# Patient Record
Sex: Male | Born: 1965 | Race: White | Hispanic: No | Marital: Married | Smoking: Current every day smoker
Health system: Southern US, Community
[De-identification: ages and names within clinical notes are randomized; demographics above are authoritative.]

## PROBLEM LIST (undated history)

## (undated) DIAGNOSIS — F1511 Other stimulant abuse, in remission: Secondary | ICD-10-CM

## (undated) DIAGNOSIS — E7800 Pure hypercholesterolemia, unspecified: Secondary | ICD-10-CM

## (undated) DIAGNOSIS — Z87891 Personal history of nicotine dependence: Secondary | ICD-10-CM

## (undated) DIAGNOSIS — I2510 Atherosclerotic heart disease of native coronary artery without angina pectoris: Secondary | ICD-10-CM

## (undated) HISTORY — PX: BACK SURGERY: SHX140

## (undated) HISTORY — PX: CORONARY ANGIOPLASTY WITH STENT PLACEMENT: SHX49

---

## 2010-09-15 ENCOUNTER — Inpatient Hospital Stay (HOSPITAL_COMMUNITY)
Admission: EM | Admit: 2010-09-15 | Discharge: 2010-09-16 | DRG: 143 | Disposition: A | Discharge status: Home / Self Care | Payer: BC Managed Care – PPO | Attending: Internal Medicine | Admitting: Internal Medicine

## 2010-09-15 ENCOUNTER — Emergency Department (HOSPITAL_COMMUNITY): Payer: BC Managed Care – PPO

## 2010-09-15 DIAGNOSIS — I2510 Atherosclerotic heart disease of native coronary artery without angina pectoris: Secondary | ICD-10-CM | POA: Diagnosis present

## 2010-09-15 DIAGNOSIS — F17200 Nicotine dependence, unspecified, uncomplicated: Secondary | ICD-10-CM | POA: Diagnosis present

## 2010-09-15 DIAGNOSIS — R51 Headache: Secondary | ICD-10-CM | POA: Diagnosis present

## 2010-09-15 DIAGNOSIS — R0789 Other chest pain: Principal | ICD-10-CM | POA: Diagnosis present

## 2010-09-15 DIAGNOSIS — F1910 Other psychoactive substance abuse, uncomplicated: Secondary | ICD-10-CM | POA: Diagnosis present

## 2010-09-15 DIAGNOSIS — R079 Chest pain, unspecified: Secondary | ICD-10-CM

## 2010-09-15 DIAGNOSIS — I252 Old myocardial infarction: Secondary | ICD-10-CM

## 2010-09-15 LAB — CBC
HCT: 40 % (ref 39.0–52.0)
MCHC: 35.8 g/dL (ref 30.0–36.0)
RDW: 13.2 % (ref 11.5–15.5)

## 2010-09-15 LAB — BASIC METABOLIC PANEL
BUN: 10 mg/dL (ref 6–23)
Calcium: 9.4 mg/dL (ref 8.4–10.5)
Creatinine, Ser: 0.83 mg/dL (ref 0.50–1.35)
GFR calc Af Amer: >60 mL/min (ref >60)
GFR calc non Af Amer: >60 mL/min (ref >60)
Potassium: 3.9 mEq/L (ref 3.5–5.1)

## 2010-09-15 LAB — CK TOTAL AND CKMB (NOT AT ARMC): Relative Index: INVALID (ref 0.0–2.5)

## 2010-09-16 ENCOUNTER — Inpatient Hospital Stay (HOSPITAL_COMMUNITY): Payer: BC Managed Care – PPO

## 2010-09-16 LAB — BASIC METABOLIC PANEL
CO2: 28 mEq/L (ref 19–32)
Chloride: 105 mEq/L (ref 96–112)
GFR calc Af Amer: >60 mL/min (ref >60)
Potassium: 4 mEq/L (ref 3.5–5.1)
Sodium: 140 mEq/L (ref 135–145)

## 2010-09-16 LAB — CARDIAC PANEL(CRET KIN+CKTOT+MB+TROPI): Relative Index: INVALID (ref 0.0–2.5)

## 2010-09-16 LAB — CBC
HCT: 37.8 % — ABNORMAL LOW (ref 39.0–52.0)
Hemoglobin: 12.9 g/dL — ABNORMAL LOW (ref 13.0–17.0)
MCH: 29.1 pg (ref 26.0–34.0)
MCV: 85.1 fL (ref 78.0–100.0)
RBC: 4.44 MIL/uL (ref 4.22–5.81)

## 2010-09-16 MED ORDER — TECHNETIUM TC 99M TETROFOSMIN IV KIT
30.0000 | PACK | Freq: Once | INTRAVENOUS | Status: AC | PRN
  Administered 2010-09-16: 30 via INTRAVENOUS

## 2010-09-16 MED ORDER — TECHNETIUM TC 99M TETROFOSMIN IV KIT
10.0000 | PACK | Freq: Once | INTRAVENOUS | Status: AC | PRN
  Administered 2010-09-16: 10 via INTRAVENOUS

## 2010-09-17 ENCOUNTER — Other Ambulatory Visit (HOSPITAL_COMMUNITY): Payer: BC Managed Care – PPO

## 2010-09-17 LAB — URINE DRUGS OF ABUSE SCREEN W ALC, ROUTINE (REF LAB)
Amphetamine Screen, Ur: NEGATIVE
Creatinine,U: 107.6 mg/dL
Marijuana Metabolite: NEGATIVE
Methadone: NEGATIVE
Propoxyphene: NEGATIVE

## 2010-09-23 ENCOUNTER — Emergency Department (HOSPITAL_COMMUNITY): Payer: BC Managed Care – PPO

## 2010-09-23 ENCOUNTER — Emergency Department (HOSPITAL_COMMUNITY)
Admission: EM | Admit: 2010-09-23 | Discharge: 2010-09-24 | Disposition: A | Discharge status: Home / Self Care | Payer: BC Managed Care – PPO | Attending: Emergency Medicine | Admitting: Emergency Medicine

## 2010-09-23 DIAGNOSIS — M25529 Pain in unspecified elbow: Secondary | ICD-10-CM | POA: Insufficient documentation

## 2010-09-23 DIAGNOSIS — I2510 Atherosclerotic heart disease of native coronary artery without angina pectoris: Secondary | ICD-10-CM | POA: Insufficient documentation

## 2010-09-23 DIAGNOSIS — I252 Old myocardial infarction: Secondary | ICD-10-CM | POA: Insufficient documentation

## 2010-09-23 NOTE — Discharge Summary (Signed)
[name], [name]             ACCOUNT NO.:  [id number]  MEDICAL RECORD NO.:  [id number]  LOCATION:  2917                         FACILITY:  [facility]  PHYSICIAN:  Jake Bathe, MD      DATE OF BIRTH:  May 22, 1965  DATE OF ADMISSION:  09/15/2010 DATE OF DISCHARGE:  09/16/2010                              DISCHARGE SUMMARY   FINAL DIAGNOSES: 1. Chest pain. 2. Coronary artery disease, status post percutaneous coronary     intervention in 2009 in [place]. 3. Tobacco use. 4. Polysubstance abuse, prior methamphetamine use.  He quit     methamphetamine 3 weeks ago.  He states quit cocaine 10 years ago.  FAMILY HISTORY:  Father with myocardial infarction at age 7.  BRIEF HOSPITAL COURSE:  A 45 year old with known coronary artery disease, status post DES to possibly LAD in 2009 after a myocardial infarction who just drove him from [place] the other day.  He was asleep, woke up, and he had some left-sided chest discomfort, described as a tightness or pressure, lasted about an hour and was similar to his prior MI pain.  This went away.  While he was in the emergency department, he had another more severe episode with some shortness of breath as well as diaphoresis.  He is a heavy smoker and can smoke up to 3 packs per day.  Recently, he quit meth use about 3 weeks ago.  He is currently not taking any medications including aspirin.  He was placed on a nitroglycerin drip overnight and did quite well except for his headache.  In the morning after sleeping, he felt better and improved.  Nitroglycerin was discontinued, and he was ambulating well without any difficulty.  A nuclear stress test was performed, which demonstrated no evidence of ischemia.  Ejection fraction was calculated at 48%.  This was personally viewed.  Mild diaphragmatic attenuation noted.  After discussion with the patient, he is eager to go forward with his rehabilitation here in [place].  He is ambulating well  and eating well.  No further chest discomfort.  I have instructed him to take medications.  DISCHARGE MEDICATIONS: 1. Aspirin 81 mg a day. 2. Metoprolol 25 mg twice a day. 3. Isosorbide mononitrate 30 mg a day. 4. Nitroglycerin 0.4 mg sublingual p.r.n. chest pain. 5. Simvastatin 20 mg daily.  LABORATORY WORK:  Cardiac biomarkers negative x3.  White count of 8.8, hemoglobin 12.9, platelets 256, BUN 14, creatinine 0.7, potassium 4.0, sodium 140.  Chest x-ray showed suggestion of upper lobe emphysema.  No acute abnormality.  Nuclear stress test as above, low-risk, no ischemia. Echocardiogram demonstrates an ejection fraction in the low normal range of approximately 50% with no other significant abnormalities.  FOLLOWUP:  I have made a followup appointment for him to see me on October 10, 2010, at 1:30 p.m.  I have also instructed him that he needs to establish a primary physician.  I do feel comfortable allowing him to proceed with his rehabilitation for polysubstance abuse.  I have given him a note stating this.  Thirty five minutes spent on discharge, reviewing discharge medications, lab work, instructions with the patient.     Jake Bathe, MD  MCS/MEDQ  D:  09/16/2010  T:  09/17/2010  Job:  [id number]  Electronically Signed by Donato Schultz MD on 09/23/2010 05:47:58 AM

## 2010-10-17 NOTE — Consult Note (Signed)
[name], [name]             ACCOUNT NO.:  [id number]  MEDICAL RECORD NO.:  [id number]  LOCATION:  2917                         FACILITY:  [facility]  PHYSICIAN:  Natasha Bence, MD       DATE OF BIRTH:  Feb 12, 1966  DATE OF CONSULTATION:  09/15/2010 DATE OF DISCHARGE:                                CONSULTATION   CHIEF COMPLAINT:  Chest pain.  HISTORY OF PRESENT ILLNESS:  [name] is a 45 year old white male with a history of known coronary artery disease, who sustained myocardial infarction and underwent stenting, it sounds like his LAD in 2009 in [place].  [name], he drove from [place] to [place] where he is staying with his daughter.  He was asleep this afternoon and woke up with left-sided chest discomfort that was described as a heavy pressure and lasted approximately 1 hour and was associated with shortness of breath.  It resolved spontaneously when he arrived in the emergency department.  While sitting in the emergency room, he had a worse episode of chest pressure with worsening shortness of breath and diaphoresis.  This was similar to the pain as was similar MI before.  He has been under a lot of stress, recently he moved up from [place] to enter a drug rehabilitation program.  He was a heavy methamphetamines until approximately 3 weeks ago.  He also has a history of cocaine abuse but quit that 10 years ago.  He is also a heavy smoker, smokes one pack per day but formerly smoked 3 packs per day.  He says he also has some calf cramping when walking.  He denies any PND, orthopnea or palpitations.  Prior to this episode of chest pain, he was doing well over the last several weeks.  On June 29 and 30, he was admitted to [facility], [place], [place], for chest discomfort. It sounds like he had some sort of stress test there.  He is unsure of the results and was titrating with medications, however, he has not been on any cardiovascular  medicines for the last several months due to cost. Otherwise, he denies any fevers, chills or sweats.  He has no upcoming plan of surgeries.  He denies any bleeding.  REVIEW OF SYSTEMS:  Rest of 12-point review of systems was performed and was otherwise negative.  PAST MEDICAL HISTORY: 1. Coronary artery disease, status post PCI in 2009. 2. Smoker of methamphetamine abuse.  SOCIAL HISTORY:  No alcohol use.  He smokes one-pack per day, used to smoke three pack per day, history of heavy methamphetamine use just quit 3 weeks ago, and a history of cocaine abuse, quit 10 years ago.  FAMILY HISTORY:  Strong for coronary artery disease with father, had MI at approximately 76 years old.  CURRENT MEDICATIONS:  None.  ALLERGIES:  He has no known drug allergies.  PHYSICAL EXAMINATION:  VITAL SIGNS:  Blood pressure 123/76, heart rate of 76, respiratory rate of 18, O2 sats 90% on room air, and temperature 98.6. GENERAL:  He is a thin white male in no apparent distress. EYES:  He has anicteric sclerae. NECK:  Normal jugular pressure.  No carotid bruits. LUNGS:  Clear  to auscultation bilaterally. CARDIOVASCULAR:  Regular rate and rhythm.  No murmurs, rubs, or gallops. ABDOMEN:  Soft, nontender, no masses or bruits. EXTREMITIES:  Warm without edema, symmetrical, pulses throughout. NEURO:  Grossly afocal. SKIN:  No rashes or ulcers.  LABORATORY DATA:  Calcium 9.4, sodium 139, potassium 3.9, chloride 100, bicarb of 28, BUN of 10, creatinine 0.3, glucose 100.  White blood cell count 9.1, hematocrit of 40, platelet count 270.  Troponin was less than 0.3.  EKG shows sinus rhythm with a rate of 64 beats per minute.  There are no ST changes.  Chest x-ray within normal limits.  IMPRESSION AND PLAN:  A 45 year old white male with known coronary artery disease status post PCI in 2009, who presents with unstable angina.  He is a heavy smoker with a heavy drug use history.  He is not currently on any  of his cardiac medications.  We will admit him to telemetry and check serial troponins as he had a worsened episode in the emergency department.  We will start him on Lovenox b.i.d.  In addition to aspirin, we will also place him on metoprolol and a statin.  We will check lipids in the morning, I will make him n.p.o. for either catheter or stress test in the morning.  We will try obtain records from [facility], [place], [place], to see which test he had done there.  I encouraged smoking cessation with him.          ______________________________ Natasha Bence, MD     MH/MEDQ  D:  09/15/2010  T:  09/16/2010  Job:  [id number]  Electronically Signed by Natasha Bence MD on 10/17/2010 08:25:36 PM

## 2010-12-19 ENCOUNTER — Emergency Department (HOSPITAL_COMMUNITY): Payer: Self-pay

## 2010-12-19 ENCOUNTER — Inpatient Hospital Stay (HOSPITAL_COMMUNITY)
Admission: EM | Admit: 2010-12-19 | Discharge: 2010-12-24 | DRG: 287 | Disposition: A | Discharge status: Rehabilitation Facility | Payer: Self-pay | Attending: Internal Medicine | Admitting: Internal Medicine

## 2010-12-19 DIAGNOSIS — I498 Other specified cardiac arrhythmias: Secondary | ICD-10-CM | POA: Diagnosis present

## 2010-12-19 DIAGNOSIS — Z87891 Personal history of nicotine dependence: Secondary | ICD-10-CM

## 2010-12-19 DIAGNOSIS — R079 Chest pain, unspecified: Secondary | ICD-10-CM | POA: Diagnosis present

## 2010-12-19 DIAGNOSIS — E785 Hyperlipidemia, unspecified: Secondary | ICD-10-CM | POA: Diagnosis present

## 2010-12-19 DIAGNOSIS — I2510 Atherosclerotic heart disease of native coronary artery without angina pectoris: Principal | ICD-10-CM | POA: Diagnosis present

## 2010-12-19 DIAGNOSIS — F1511 Other stimulant abuse, in remission: Secondary | ICD-10-CM | POA: Diagnosis present

## 2010-12-19 DIAGNOSIS — Z9861 Coronary angioplasty status: Secondary | ICD-10-CM

## 2010-12-19 DIAGNOSIS — I200 Unstable angina: Secondary | ICD-10-CM | POA: Diagnosis present

## 2010-12-19 LAB — BASIC METABOLIC PANEL
Calcium: 10.3 mg/dL (ref 8.4–10.5)
GFR calc non Af Amer: >90 mL/min (ref >90)
Glucose, Bld: 92 mg/dL (ref 70–99)
Potassium: 4 mEq/L (ref 3.5–5.1)
Sodium: 141 mEq/L (ref 135–145)

## 2010-12-19 LAB — CBC
HCT: 41.5 % (ref 39.0–52.0)
MCV: 84.2 fL (ref 78.0–100.0)
Platelets: 262 10*3/uL (ref 150–400)
RBC: 4.93 MIL/uL (ref 4.22–5.81)
WBC: 6.6 10*3/uL (ref 4.0–10.5)

## 2010-12-19 LAB — CK TOTAL AND CKMB (NOT AT ARMC)
CK, MB: 1.8 ng/mL (ref 0.3–4.0)
Relative Index: INVALID (ref 0.0–2.5)
Total CK: 91 U/L (ref 7–232)

## 2010-12-19 LAB — CARDIAC PANEL(CRET KIN+CKTOT+MB+TROPI): CK, MB: 1.7 ng/mL (ref 0.3–4.0)

## 2010-12-19 LAB — POCT I-STAT TROPONIN I

## 2010-12-20 LAB — DIFFERENTIAL
Basophils Relative: 1 % (ref 0–1)
Lymphocytes Relative: 37 % (ref 12–46)
Lymphs Abs: 2.5 10*3/uL (ref 0.7–4.0)
Monocytes Absolute: 0.5 10*3/uL (ref 0.1–1.0)
Monocytes Relative: 8 % (ref 3–12)
Neutro Abs: 3.4 10*3/uL (ref 1.7–7.7)
Neutrophils Relative %: 51 % (ref 43–77)

## 2010-12-20 LAB — COMPREHENSIVE METABOLIC PANEL
ALT: 15 U/L (ref 0–53)
Alkaline Phosphatase: 66 U/L (ref 39–117)
BUN: 13 mg/dL (ref 6–23)
CO2: 28 mEq/L (ref 19–32)
Calcium: 9.4 mg/dL (ref 8.4–10.5)
GFR calc Af Amer: >90 mL/min (ref >90)
GFR calc non Af Amer: >90 mL/min (ref >90)
Glucose, Bld: 89 mg/dL (ref 70–99)
Potassium: 3.9 mEq/L (ref 3.5–5.1)
Total Protein: 6.4 g/dL (ref 6.0–8.3)

## 2010-12-20 LAB — LIPID PANEL
Cholesterol: 127 mg/dL (ref 0–200)
LDL Cholesterol: 64 mg/dL (ref 0–99)
Total CHOL/HDL Ratio: 3.4 RATIO
VLDL: 26 mg/dL (ref 0–40)

## 2010-12-20 LAB — CARDIAC PANEL(CRET KIN+CKTOT+MB+TROPI)
CK, MB: 1.5 ng/mL (ref 0.3–4.0)
Total CK: 78 U/L (ref 7–232)

## 2010-12-20 LAB — CBC
HCT: 40 % (ref 39.0–52.0)
Hemoglobin: 13.6 g/dL (ref 13.0–17.0)
MCH: 28.7 pg (ref 26.0–34.0)
MCHC: 34 g/dL (ref 30.0–36.0)
MCV: 84.4 fL (ref 78.0–100.0)
RBC: 4.74 MIL/uL (ref 4.22–5.81)

## 2010-12-20 LAB — PROTIME-INR
INR: 1.07 (ref 0.00–1.49)
Prothrombin Time: 14.1 seconds (ref 11.6–15.2)

## 2010-12-20 LAB — MAGNESIUM: Magnesium: 1.9 mg/dL (ref 1.5–2.5)

## 2010-12-20 LAB — TSH: TSH: 2.06 u[IU]/mL (ref 0.350–4.500)

## 2010-12-21 DIAGNOSIS — R079 Chest pain, unspecified: Secondary | ICD-10-CM

## 2010-12-21 LAB — CBC
Hemoglobin: 14.3 g/dL (ref 13.0–17.0)
MCH: 29.3 pg (ref 26.0–34.0)
MCHC: 35 g/dL (ref 30.0–36.0)
Platelets: 260 10*3/uL (ref 150–400)
RDW: 12.6 % (ref 11.5–15.5)

## 2010-12-21 LAB — HEPARIN LEVEL (UNFRACTIONATED)
Heparin Unfractionated: 0.19 IU/mL — ABNORMAL LOW (ref 0.30–0.70)
Heparin Unfractionated: 0.11 IU/mL — ABNORMAL LOW (ref 0.30–0.70)
Heparin Unfractionated: 0.4 IU/mL (ref 0.30–0.70)

## 2010-12-21 NOTE — H&P (Signed)
[name]             ACCOUNT NO.:  [id number]  MEDICAL RECORD NO.:  [id number]  LOCATION:  WLED                         FACILITY:  [facility]  PHYSICIAN:  Kathlen Mody, MD       DATE OF BIRTH:  07-25-1965  DATE OF ADMISSION:  12/19/2010 DATE OF DISCHARGE:                             HISTORY & PHYSICAL   PRIMARY CARE PHYSICIAN:  None.  CHIEF COMPLAINT:  Chest pain.  HISTORY OF PRESENT ILLNESS:  A 45 year old gentleman with history of coronary artery disease status post PCI in 2009 in [place], was brought to [facility] ER for persistent left-sided chest pain, which started yesterday on exertion.  The patient went on a hike and he started having left-sided chest pain like a ton of bricks on his chest.  He went home, took the nitroglycerin, had relief from the chest pain and went to bed. This morning, he got up and as he was walking around, the chest pain came back, left-sided, nonradiating, also felt like a ton of bricks on his chest, associated with some shortness of breath.  He denies any fevers, chills, any cough.  Denies any dizziness or any syncopal episodes.  The chest pain that he had last night and this morning was similar to the pain that had happened before he had the cardiac stenting.  In the ER, patient got sublingual nitroglycerin and he is chest pain-free at this time.  The patient also has a history of heavy methamphetamine drug use until July 2012, where he transferred to the rehab in [place] for drug abuse and has been in the rehab over the last 3 months.  The patient denies any orthopnea, PND, or palpitations. Denies any urinary complaints.  Denies any nausea, vomiting, diarrhea, or abdominal pain.  He denies any headache, blurry vision, any tingling or numbness anywhere in his body.  REVIEW OF SYSTEMS:  See HPI, otherwise negative.  PAST MEDICAL HISTORY: 1. Coronary artery disease status post PCI in 2009. 2. He is an ex-methamphetamine  abuser.  SOCIAL HISTORY:  The [facility] is [facility].  Denies alcohol.  Quit smoking 3 months ago.  Quit meth abuse and he quit cocaine abuse 10 years ago.  FAMILY HISTORY:  Coronary artery disease in father at the age of 47. Patient's father had a massive attack and died from it.  MEDICATIONS:  The patient is on, 1. Aspirin 81. 2. Simvastatin 20 mg. 3. Metoprolol 25 b.i.d. 4. Isosorbide mononitrate 30 mg daily.  ALLERGIES:  No known drug allergies.  PHYSICAL EXAMINATION:  VITAL SIGNS:  He is afebrile.  Blood pressure 110/76, pulse rate of 58, respiratory rate 16, saturating 100% on 2 L. GENERAL:  On exam, he is alert, afebrile, oriented x3, comfortable, in no acute distress. HEENT:  Pupils reacting to light and accommodation.  No JVD.  No scleral icterus. CARDIOVASCULAR:  S1, S2 heard. RESPIRATORY EXAM:  Chest clear to auscultation bilaterally. ABDOMEN:  Soft, nontender, nondistended.  Bowel sounds are heard. EXTREMITIES:  No pedal edema. NEUROLOGICAL:  Nonfocal.  LABORATORY DATA:  CBC within normal limits.  Point-of-care troponin negative.  Basic metabolic panel within normal limits.  CK-MB 1.8 and creatine kinase  91.  The patient had a chest x-ray done, which showed no active disease.  ASSESSMENT AND PLAN:  A 45 year old gentleman with history of meth abuse, currently in rehab, coronary artery disease status post PCI in 2009, came in for unstable angina.  He will be admitted to [facility].  We will get serial troponins.  We will get a 2-D echocardiogram to rule out ACS.  Cardiology consult called.  We will continue the patient on aspirin 325 mg, metoprolol 25 mg daily, simvastatin 20 mg daily, and sublingual nitroglycerin for chest pain p.r.n.  Deep vein thrombosis prophylaxis, subcutaneous Lovenox.  Dosing as per the pharmacy.  The patient is full code.          ______________________________ Kathlen Mody, MD     VA/MEDQ  D:  12/19/2010   T:  12/19/2010  Job:  [id number]  Electronically Signed by Kathlen Mody MD on 12/21/2010 08:13:20 PM

## 2010-12-22 LAB — HEPARIN LEVEL (UNFRACTIONATED)
Heparin Unfractionated: 0.28 IU/mL — ABNORMAL LOW (ref 0.30–0.70)
Heparin Unfractionated: 0.5 IU/mL (ref 0.30–0.70)

## 2010-12-22 LAB — CBC
MCH: 29.1 pg (ref 26.0–34.0)
MCHC: 34.3 g/dL (ref 30.0–36.0)
MCV: 84.9 fL (ref 78.0–100.0)
Platelets: 268 10*3/uL (ref 150–400)
RBC: 5.02 MIL/uL (ref 4.22–5.81)
RDW: 12.8 % (ref 11.5–15.5)

## 2010-12-23 ENCOUNTER — Ambulatory Visit (HOSPITAL_COMMUNITY)
Admission: AD | Admit: 2010-12-23 | Discharge: 2010-12-23 | Disposition: A | Discharge status: Home / Self Care | Payer: Self-pay | Source: Ambulatory Visit | Attending: Interventional Cardiology | Admitting: Interventional Cardiology

## 2010-12-23 DIAGNOSIS — R0602 Shortness of breath: Secondary | ICD-10-CM | POA: Insufficient documentation

## 2010-12-23 DIAGNOSIS — I2510 Atherosclerotic heart disease of native coronary artery without angina pectoris: Secondary | ICD-10-CM | POA: Insufficient documentation

## 2010-12-23 DIAGNOSIS — Z9861 Coronary angioplasty status: Secondary | ICD-10-CM | POA: Insufficient documentation

## 2010-12-23 DIAGNOSIS — I200 Unstable angina: Secondary | ICD-10-CM | POA: Insufficient documentation

## 2010-12-23 LAB — BASIC METABOLIC PANEL
CO2: 27 mEq/L (ref 19–32)
Calcium: 9.7 mg/dL (ref 8.4–10.5)
Creatinine, Ser: 0.79 mg/dL (ref 0.50–1.35)
GFR calc non Af Amer: >90 mL/min (ref >90)
Sodium: 140 mEq/L (ref 135–145)

## 2010-12-23 LAB — CBC
Hemoglobin: 14.1 g/dL (ref 13.0–17.0)
MCH: 28.5 pg (ref 26.0–34.0)
MCHC: 33.3 g/dL (ref 30.0–36.0)
RDW: 12.8 % (ref 11.5–15.5)

## 2010-12-23 LAB — POCT ACTIVATED CLOTTING TIME: Activated Clotting Time: 127 seconds

## 2010-12-23 LAB — HEPARIN LEVEL (UNFRACTIONATED): Heparin Unfractionated: 0.38 IU/mL (ref 0.30–0.70)

## 2010-12-24 LAB — CBC
HCT: 40.1 % (ref 39.0–52.0)
MCHC: 33.9 g/dL (ref 30.0–36.0)
MCV: 84.6 fL (ref 78.0–100.0)
Platelets: 276 10*3/uL (ref 150–400)
RDW: 12.6 % (ref 11.5–15.5)

## 2010-12-24 LAB — BASIC METABOLIC PANEL
BUN: 20 mg/dL (ref 6–23)
Creatinine, Ser: 0.79 mg/dL (ref 0.50–1.35)
GFR calc Af Amer: >90 mL/min (ref >90)
GFR calc non Af Amer: >90 mL/min (ref >90)
Potassium: 3.7 mEq/L (ref 3.5–5.1)

## 2010-12-25 NOTE — Discharge Summary (Addendum)
[name], [name]             ACCOUNT NO.:  [id number]  MEDICAL RECORD NO.:  [id number]  LOCATION:  1406                         FACILITY:  [facility]  PHYSICIAN:  Clydia Llano, MD       DATE OF BIRTH:  [birth year]  DATE OF ADMISSION:  12/19/2010 DATE OF DISCHARGE:  12/24/2010                        DISCHARGE SUMMARY - REFERRING   PRIMARY CARE PROVIDER:  None.  CARDIOLOGIST:  Jake Bathe, MD  DISCHARGE DIAGNOSES: 1. Unstable angina. 2. Coronary artery disease. 3. Ex-methamphetamine abuser.  DISCHARGE MEDICATIONS: 1. Aspirin 81 mg p.o. daily. 2. Imdur 30 mg p.o. daily. 3. Metoprolol tartrate 25 mg p.o. b.i.d. 4. Nitroglycerin sublingually 0.4 mg every 5 minutes as needed up to 3     doses for chest pain. 5. Simvastatin 20 mg p.o. daily.  DIAGNOSTIC LABS:  WBC 6.6, hemoglobin 14.2, hematocrit 41.5, platelets 262.  Sodium 141, potassium 4.0, chloride 104, CO2 28.  BUN 11, creatinine 0.79, glucose 92, total CK 91, CK-MB 1.8.  Troponin 1 is less than 0.30.  Second set:  Total CK 80, CK-MB 1.7, troponin 1 less than 0.30.  Third set:  Total CK 78, CK-MB 1.5, troponin 1 less than 0.30. PT 14.1, INR 1.07.  Magnesium 1.9, phosphorus 4.9.  ProBNP 59.7.  Lipid profile is unremarkable except an HDL of 37.  TSH is 2.06.  DIAGNOSTIC IMAGING: 1. Chest x-ray on October 18 yields heart size and mediastinal     contours within normal limits.  Both lungs are clear.  No active     disease. 2. The patient did have a 2D echo in July of this year that showed an     ejection fraction range of 50-55%.  Left ventricular diastolic     function parameters normal.  CONSULTATIONS:  Dr. Armanda Magic from Cardiology seen on October 19.  PROCEDURES:  Cardiac cath done at [facility] on October 22 yielding a patent LAD stent.  No significant CAD.  Ejection fraction 50%.  BRIEF HISTORY:  [name] is a 45 year old male with a history of coronary artery disease status post PCI in 2009 in [place],  who presented to [facility] ER for persistent left-sided chest pain which had started 24 hours prior to presentation.  The patient reported he went on a hike and started having left-sided chest pain like a ton of bricks on his chest.  He took nitro and got relief for the rest of the day.  The next morning, he got up and experienced the recurrence of the chest pain on the left side nonradiating and associated with shortness of breath.  He denied any fever, chills, or cough.  He indicated that the chest pain that he had experienced was similar to the pain that he had before he had the cardiac stenting.  In the emergency room, the patient got sublingual nitroglycerin and his chest pain resolved.  [facility] hospitalists were asked to admit for further evaluation and treatment.  HOSPITAL COURSE BY PROBLEM: 1. Unstable angina.  The patient was admitted to telemetry unit.     Cardiac enzymes were cycled as dictated above.  He was started on a     heparin drip, aspirin and beta-blocker as  well as nitro paste.     Heparin drip was discontinued on October 21.  The patient was seen     by Dr. Armanda Magic on October 19.  The patient's chest pain     improved during this time frame.  Dr. Norris Cross assessment included     unstable angina with coronary artery disease status post     percutaneous coronary intervention in 2009 in [place] and a repeat     cath a year later in [place].  She recommended a cardiac cath which     was done on October 19 as dictated above.  The patient had 1 brief     incident of very mild chest pain post catheterization the next     morning.  He did not require any nitro and did not report this till     2 hours after the __________.  Other than that 1 episode, he     remained pain free.  He felt better, was ambulating without     difficulty.  At the time of this dictation, he is ready to return     to rehab. 2. Coronary artery disease.  See number #1.  The patient will be      discharged on aspirin, Imdur and a beta-blocker and a statin.  He     did have a cardiac cath as dictated above, which yielded no     significant CAD and an ejection fraction of 50%.  He has a followup     appointment with Dr. Anne Fu on November 14 at 01:30. 3. Ex-methamphetamine user.  The patient is returning to [facility] where he has been for the last several months.  PHYSICAL EXAMINATION:  VITAL SIGNS:  Temperature 97.9, blood pressure 116/66, heart rate 57, respirations 16, sats 97% on room air. GENERAL:  Awake, alert, ambulating in room. CV:  Regular rate and rhythm.  No murmur, gallop, or rub.  No lower extremity edema. RESPIRATORY:  Normal effort.  Breath sounds clear to auscultation bilaterally.  No wheezes, no rhonchi. ABDOMEN:  Flat, soft.  Positive bowel sounds throughout, nontender to palpation. NEUROLOGIC:  Alert and oriented x3.  Speech clear.  Facial symmetry.  DIET:  Heart-healthy.  ACTIVITY:  As tolerated.  FOLLOWUP:  The patient has an appointment with Dr. Anne Fu on November 14 at 01:30.  DISPOSITION:  The patient is medically stable and ready for discharge to [facility].  Time spent on this discharge 35 minutes.     Gwenyth Bender, NP   ______________________________ Clydia Llano, MD    KMB/MEDQ  D:  12/24/2010  T:  12/24/2010  Job:  [id number]  cc:   Jake Bathe, MD Fax: [phone]  Electronically Signed by Toya Smothers  on 12/25/2010 03:52:13 PM Electronically Signed by Clydia Llano  on 12/31/2010 11:07:10 AM

## 2010-12-26 NOTE — Consult Note (Signed)
[name], [name]             ACCOUNT NO.:  [id number]  MEDICAL RECORD NO.:  [id number]  LOCATION:  1406                         FACILITY:  [facility]  PHYSICIAN:  Armanda Magic, M.D.     DATE OF BIRTH:  1965-04-07  DATE OF CONSULTATION:  12/20/2010 DATE OF DISCHARGE:                                CONSULTATION   REFERRING PHYSICIAN:  Kathlen Mody, MD  CHIEF COMPLAINT:  Chest pain.  HISTORY OF PRESENT ILLNESS:  This is a 45 year old male with history of coronary artery disease status post PCI in 2009 in [place] who presented with left-sided chest pain to [facility] on October 18.  The pain started on Wednesday, October 17 and he described it as a persistent left-sided chest pressure.  Apparently, he had gone on a hike and started having left-sided chest pressure like a ton of bricks on his chest.  He went home, took nitroglycerin with relief of chest pain and went to bed.  On the morning of admission, he got up and was walking around.  The pain came back which was left-sided, nonradiating, and felt like a ton of bricks sitting on his chest associated with shortness of breath.  He denied any fevers, chills, or cough.  The pain lasted episodically throughout the night and on the day of admission as well. The pain was similar to what he had before his stent.  In the ER, he received sublingual nitroglycerin and was pain-free.  He does have a history of heavy methamphetamine use until July 2012 when he was transferred to rehab in [place] for drug abuse and had been in rehab for 3 months.  Currently, he complains of very mild discomfort in his chest.  PAST MEDICAL HISTORY: 1. CAD status post PCI in 2009.  Apparently, he was told that he had     some restenoses with stent by cath a year later. 2. Ex-methamphetamine abuser.  SOCIAL HISTORY:  He is currently living in [facility].  He denies any alcohol use.  He quit smoking 3 months ago.  He quit meth abuse and quit  cocaine abuse 10 years ago.  FAMILY HISTORY:  Significant for coronary artery disease in his father at age 45.  His father had a massive MI and died from it.  MEDICATIONS ON ADMISSION: 1. Aspirin 81 mg daily. 2. Simvastatin 20 mg daily. 3. Metoprolol 25 mg b.i.d. 4. Imdur 30 mg daily.  He has no known drug allergies.  REVIEW OF SYSTEMS:  All 10-point review of systems is negative except what stated in the HPI.  PHYSICAL EXAMINATION:  GENERAL:  He is a well-developed, well-nourished, white male, in no acute distress. VITAL SIGNS:  His blood pressure is 111/70, respirations 16, he is afebrile, heart rate 58, and O2 saturation 99% on room air. HEENT:  Benign. NECK:  Supple without lymphadenopathy.  Carotid upstrokes 2+ bilaterally.  No bruits. LUNGS:  Clear to auscultation throughout. HEART:  Regular rate and rhythm.  No murmurs, rubs, or gallops.  Normal S1 and S2. ABDOMEN:  Soft, nontender, and nondistended.  Normoactive bowel sounds. No hepatosplenomegaly. EXTREMITIES:  No edema.  LABORATORY DATA:  BNP is 59.  White cell count 6.6,  hemoglobin 14.2, hematocrit 41.5, and platelet count 262.  Sodium 141, potassium 4, chloride 104, bicarb 28, glucose 92, BUN 11, and creatinine 0.79. Troponin less than 0.3, CPK 91, and MB 81.  Repeat CPK 80, MB 1.7, and troponin less than 0.3.  Last set of cardiac enzymes; CPK 78, MB 1.5, and troponin less than 0.3.  His chest x-ray showed no active disease. EKG is not available on the chart.  ASSESSMENT: 1. Unstable angina pectoris. 2. Coronary artery disease status post percutaneous coronary     intervention in 2009 in [place] and repeat cath one year later in     [place], he was told he had some in-stent restenosis but was     continued on medical management. 3. Ex-amphetamine use.  PLAN:  Cath on Monday by Dr. Anne Fu.  We will continue aspirin, beta- blocker, statin, and nitro paste.  We will start IV heparin drip.     Armanda Magic,  M.D.     TT/MEDQ  D:  12/20/2010  T:  12/21/2010  Job:  [id number]  cc:   Jake Bathe, MD  Electronically Signed by Armanda Magic M.D. on 12/26/2010 02:13:27 PM

## 2010-12-30 NOTE — Cardiovascular Report (Signed)
  [name], [name]             ACCOUNT NO.:  [id number]  MEDICAL RECORD NO.:  [id number]  LOCATION:  CATH                         FACILITY:  [facility]  PHYSICIAN:  Corky Crafts, MDDATE OF BIRTH:  Jan 21, 1966  DATE OF PROCEDURE:  12/23/2010 DATE OF DISCHARGE:                           CARDIAC CATHETERIZATION   PRIMARY CARDIOLOGIST:  Jake Bathe, MD  PROCEDURES PERFORMED:  Left heart catheterization, left ventriculogram, coronary angiogram, abdominal aortogram.  OPERATORS:  Corky Crafts, MD  INDICATIONS:  Unstable angina.  PROCEDURE NARRATIVE:  The risks and benefits of cardiac catheterization were explained to the patient.  Informed consent was obtained.  He was brought to the cath lab.  He was prepped and draped in usual sterile fashion.  His right groin was infiltrated with 1% lidocaine.  A 5-French sheath was placed into the right common femoral artery using modified Seldinger technique.  Left coronary artery angiography was performed using JL-4 pigtail catheter.  The catheter was advanced to the vessel ostium under fluoroscopic guidance.  Digital angiography was performed in multiple projections using hand injection of contrast.  Right coronary artery angiography was performed using JL-4.0 catheter in a similar fashion.  Pigtail catheter was advanced into the ascending aorta and across the aortic valve under fluoroscopic guidance.  Power injection contrast was performed in the RAO projection to image the left ventricle.  Catheter was pulled back under continuous hemodynamic pressure monitoring.  The catheter was withdrawn to the abdominal aorta and a power injection contrast was performed in the AP projection to image the infrarenal aorta.  Manual compression was used to obtain hemostasis of the right groin.  FINDINGS:  The left main was short and widely patent.  Left circumflex is a large dominant vessel.  There is a small OM1 and OM2 both of which were  patent.  The OM3 was medium-sized and widely patent.  The left PDA was medium-sized and widely patent.  Left anterior descending is a large vessel proximally and medium sized in the mid to distal vessel.  There is a patent stent in the mid LAD after the origin of 1st diagonal just proximal to the stent, there is a 25% stenosis.  The 1st diagonal is medium-sized and widely patent.  The right coronary artery is small nondominant vessel which is widely patent.  Left ventriculogram shows overall normal left ventricular function with an estimated ejection fraction of 50%.  Abdominal aortogram shows no abdominal aortic aneurysm.  The bilateral single renal arteries which were widely patent.  The SMA also appears widely patent.  HEMODYNAMIC DATA:  Left ventricular pressure 98/3, aortic pressure 101/61 with a mean aortic pressure of 79 mmHg.  Left ventricular end- diastolic pressure 7 mmHg.  IMPRESSION: 1. Patent left anterior descending stent. 2. No hemodynamically significant coronary artery disease. 3. Ejection fraction of 50%. 4. No abdominal aortic aneurysm or renal artery stenosis.  RECOMMENDATIONS:  This patient needs aggressive secondary prevention including smoking cessation and continued medical therapy.     Corky Crafts, MD     JSV/MEDQ  D:  12/23/2010  T:  12/23/2010  Job:  [id number]  Electronically Signed by Lance Muss MD on 12/30/2010 01:26:14 PM

## 2011-03-01 ENCOUNTER — Emergency Department (HOSPITAL_COMMUNITY)
Admission: EM | Admit: 2011-03-01 | Discharge: 2011-03-01 | Disposition: A | Discharge status: Home / Self Care | Payer: Self-pay | Attending: Emergency Medicine | Admitting: Emergency Medicine

## 2011-03-01 ENCOUNTER — Emergency Department (HOSPITAL_COMMUNITY): Payer: Self-pay

## 2011-03-01 ENCOUNTER — Encounter: Payer: Self-pay | Admitting: *Deleted

## 2011-03-01 DIAGNOSIS — J111 Influenza due to unidentified influenza virus with other respiratory manifestations: Secondary | ICD-10-CM

## 2011-03-01 DIAGNOSIS — E7800 Pure hypercholesterolemia, unspecified: Secondary | ICD-10-CM | POA: Insufficient documentation

## 2011-03-01 DIAGNOSIS — Z79899 Other long term (current) drug therapy: Secondary | ICD-10-CM | POA: Insufficient documentation

## 2011-03-01 DIAGNOSIS — R0602 Shortness of breath: Secondary | ICD-10-CM | POA: Insufficient documentation

## 2011-03-01 DIAGNOSIS — R197 Diarrhea, unspecified: Secondary | ICD-10-CM | POA: Insufficient documentation

## 2011-03-01 DIAGNOSIS — R110 Nausea: Secondary | ICD-10-CM | POA: Insufficient documentation

## 2011-03-01 DIAGNOSIS — R05 Cough: Secondary | ICD-10-CM | POA: Insufficient documentation

## 2011-03-01 DIAGNOSIS — R059 Cough, unspecified: Secondary | ICD-10-CM | POA: Insufficient documentation

## 2011-03-01 DIAGNOSIS — R6889 Other general symptoms and signs: Secondary | ICD-10-CM | POA: Insufficient documentation

## 2011-03-01 DIAGNOSIS — Z7982 Long term (current) use of aspirin: Secondary | ICD-10-CM | POA: Insufficient documentation

## 2011-03-01 DIAGNOSIS — R509 Fever, unspecified: Secondary | ICD-10-CM | POA: Insufficient documentation

## 2011-03-01 HISTORY — DX: Atherosclerotic heart disease of native coronary artery without angina pectoris: I25.10

## 2011-03-01 HISTORY — DX: Pure hypercholesterolemia, unspecified: E78.00

## 2011-03-01 NOTE — ED Notes (Signed)
Starting 3 days ago, the pt began having cold chills, fever, cough, runny nose, N/V/D, and generalized aches and pains.

## 2011-03-01 NOTE — ED Notes (Signed)
Pt c/o cough and shortness of breath x 3 days, nausea x 2 day w/ vomiting x 1 episode. Pt c/o fever and chills. Pt has cardiac hx, denies CP at this time. Pt last took advil and otc sinus med at 1700 tonight w/o relief.

## 2011-03-01 NOTE — ED Provider Notes (Signed)
History     CSN: [id number]  Arrival date & time 03/01/11  1943   First MD Initiated Contact with Patient 03/01/11 2139      Chief Complaint  Patient presents with  . Cough  . Shortness of Breath  . Fever    chills  . Nausea    vomiting x 1    (Consider location/radiation/quality/duration/timing/severity/associated sxs/prior treatment) HPI Patient with complaints of uri symptoms for three days with subjective fever, nausea, and diarrhea.  Patient taking po fluids well now.  No flu shot.  Patient quit smoking in July.  PMD is [facility].   Past Medical History  Diagnosis Date  . Coronary arteriosclerosis 2009    stents placed  . Hypercholesterolemia     Past Surgical History  Procedure Date  . Back surgery     fusion-lumbar    History reviewed. No pertinent family history.  History  Substance Use Topics  . Smoking status: Not on file  . Smokeless tobacco: Not on file  . Alcohol Use:       Review of Systems  All other systems reviewed and are negative.    Allergies  Review of patient's allergies indicates no known allergies.  Home Medications   Current Outpatient Rx  Name Route Sig Dispense Refill  . ASPIRIN EC 81 MG PO TBEC Oral Take 81 mg by mouth daily.      . ISOSORBIDE MONONITRATE ER 30 MG PO TB24 Oral Take 30 mg by mouth daily.      Marland Kitchen METOPROLOL TARTRATE 25 MG PO TABS Oral Take 25 mg by mouth 2 (two) times daily.      Marland Kitchen NITROGLYCERIN 0.4 MG SL SUBL Sublingual Place 0.4 mg under the tongue every 5 (five) minutes as needed.      Marland Kitchen SIMVASTATIN 20 MG PO TABS Oral Take 20 mg by mouth at bedtime.        BP 121/72  Pulse 65  Temp(Src) 97.5 F (36.4 C) (Oral)  Resp 16  SpO2 98%  Physical Exam  Nursing note and vitals reviewed. Constitutional: He is oriented to person, place, and time. He appears well-developed and well-nourished.  HENT:  Head: Normocephalic and atraumatic.  Eyes: Conjunctivae and EOM are normal. Pupils are equal, round, and  reactive to light.  Neck: Normal range of motion. Neck supple.  Cardiovascular: Normal rate and regular rhythm.   Pulmonary/Chest: Effort normal and breath sounds normal.  Abdominal: Soft. Bowel sounds are normal.  Musculoskeletal: Normal range of motion.  Neurological: He is alert and oriented to person, place, and time.  Skin: Skin is warm.  Psychiatric: He has a normal mood and affect.    ED Course  Procedures (including critical care time)  Labs Reviewed - No data to display Dg Chest 2 View  03/01/2011  *RADIOLOGY REPORT*  Clinical Data: Fever and shortness of breath  CHEST - 2 VIEW  Comparison: 12/19/2010  Findings: Normal heart size and pulmonary vascularity.  Mild hyperinflation and scattered fibrosis in the lungs.  No focal airspace consolidation.  No blunting of costophrenic angles.  No pneumothorax.  No significant change since previous study.  IMPRESSION: No evidence of active pulmonary disease.  Original Report Authenticated By: Marlon Pel, M.D.     No diagnosis found.    MDM         Hilario Quarry, MD 03/01/11 [id number]

## 2011-05-08 ENCOUNTER — Emergency Department (HOSPITAL_COMMUNITY): Payer: Self-pay

## 2011-05-08 ENCOUNTER — Other Ambulatory Visit: Payer: Self-pay

## 2011-05-08 ENCOUNTER — Observation Stay (HOSPITAL_COMMUNITY)
Admission: EM | Admit: 2011-05-08 | Discharge: 2011-05-09 | Disposition: A | Discharge status: Home / Self Care | Payer: 59 | Attending: Family Medicine | Admitting: Family Medicine

## 2011-05-08 ENCOUNTER — Encounter (HOSPITAL_COMMUNITY): Payer: Self-pay | Admitting: Emergency Medicine

## 2011-05-08 DIAGNOSIS — R0789 Other chest pain: Principal | ICD-10-CM | POA: Insufficient documentation

## 2011-05-08 DIAGNOSIS — E785 Hyperlipidemia, unspecified: Secondary | ICD-10-CM | POA: Insufficient documentation

## 2011-05-08 DIAGNOSIS — Z9861 Coronary angioplasty status: Secondary | ICD-10-CM | POA: Insufficient documentation

## 2011-05-08 DIAGNOSIS — Z7982 Long term (current) use of aspirin: Secondary | ICD-10-CM | POA: Insufficient documentation

## 2011-05-08 DIAGNOSIS — R079 Chest pain, unspecified: Secondary | ICD-10-CM | POA: Diagnosis present

## 2011-05-08 DIAGNOSIS — I2510 Atherosclerotic heart disease of native coronary artery without angina pectoris: Secondary | ICD-10-CM | POA: Insufficient documentation

## 2011-05-08 DIAGNOSIS — Z23 Encounter for immunization: Secondary | ICD-10-CM | POA: Insufficient documentation

## 2011-05-08 DIAGNOSIS — Z87891 Personal history of nicotine dependence: Secondary | ICD-10-CM | POA: Insufficient documentation

## 2011-05-08 DIAGNOSIS — F1511 Other stimulant abuse, in remission: Secondary | ICD-10-CM | POA: Insufficient documentation

## 2011-05-08 DIAGNOSIS — I249 Acute ischemic heart disease, unspecified: Secondary | ICD-10-CM

## 2011-05-08 DIAGNOSIS — Z79899 Other long term (current) drug therapy: Secondary | ICD-10-CM | POA: Insufficient documentation

## 2011-05-08 DIAGNOSIS — E7800 Pure hypercholesterolemia, unspecified: Secondary | ICD-10-CM | POA: Insufficient documentation

## 2011-05-08 HISTORY — DX: Other stimulant abuse, in remission: F15.11

## 2011-05-08 HISTORY — DX: Personal history of nicotine dependence: Z87.891

## 2011-05-08 LAB — BASIC METABOLIC PANEL
Calcium: 10 mg/dL (ref 8.4–10.5)
GFR calc non Af Amer: >90 mL/min (ref >90)
Glucose, Bld: 111 mg/dL — ABNORMAL HIGH (ref 70–99)
Sodium: 142 mEq/L (ref 135–145)

## 2011-05-08 LAB — CBC
MCH: 29.2 pg (ref 26.0–34.0)
MCHC: 35.2 g/dL (ref 30.0–36.0)
MCV: 82.8 fL (ref 78.0–100.0)
Platelets: 242 10*3/uL (ref 150–400)
RDW: 12.7 % (ref 11.5–15.5)

## 2011-05-08 LAB — URINALYSIS, ROUTINE W REFLEX MICROSCOPIC
Bilirubin Urine: NEGATIVE
Leukocytes, UA: NEGATIVE
Nitrite: NEGATIVE
Specific Gravity, Urine: 1.006 (ref 1.005–1.030)
pH: 7 (ref 5.0–8.0)

## 2011-05-08 LAB — CARDIAC PANEL(CRET KIN+CKTOT+MB+TROPI): Relative Index: INVALID (ref 0.0–2.5)

## 2011-05-08 LAB — DIFFERENTIAL
Basophils Relative: 1 % (ref 0–1)
Eosinophils Absolute: 0.2 10*3/uL (ref 0.0–0.7)
Eosinophils Relative: 2 % (ref 0–5)
Lymphs Abs: 2.3 10*3/uL (ref 0.7–4.0)

## 2011-05-08 LAB — PROTIME-INR: INR: 0.92 (ref 0.00–1.49)

## 2011-05-08 LAB — POCT I-STAT TROPONIN I

## 2011-05-08 MED ORDER — ENOXAPARIN SODIUM 40 MG/0.4ML ~~LOC~~ SOLN
40.0000 mg | Freq: Every day | SUBCUTANEOUS | Status: DC
  Administered 2011-05-09: 40 mg via SUBCUTANEOUS
  Filled 2011-05-08 (×3): qty 0.4

## 2011-05-08 MED ORDER — ASPIRIN 81 MG PO CHEW
324.0000 mg | CHEWABLE_TABLET | Freq: Once | ORAL | Status: AC
  Administered 2011-05-08: 324 mg via ORAL
  Filled 2011-05-08: qty 4

## 2011-05-08 MED ORDER — ACETAMINOPHEN 325 MG PO TABS: 650.0000 mg | ORAL_TABLET | Freq: Four times a day (QID) | ORAL | Status: DC | PRN

## 2011-05-08 MED ORDER — ONDANSETRON HCL 4 MG/2ML IJ SOLN: 4.0000 mg | Freq: Four times a day (QID) | INTRAMUSCULAR | Status: DC | PRN

## 2011-05-08 MED ORDER — PANTOPRAZOLE SODIUM 40 MG PO TBEC
40.0000 mg | DELAYED_RELEASE_TABLET | Freq: Every day | ORAL | Status: DC
  Administered 2011-05-09 (×2): 40 mg via ORAL
  Filled 2011-05-08 (×2): qty 1

## 2011-05-08 MED ORDER — ACETAMINOPHEN 650 MG RE SUPP: 650.0000 mg | Freq: Four times a day (QID) | RECTAL | Status: DC | PRN

## 2011-05-08 MED ORDER — ASPIRIN 325 MG PO TABS
325.0000 mg | ORAL_TABLET | Freq: Every day | ORAL | Status: DC
  Administered 2011-05-09: 325 mg via ORAL
  Filled 2011-05-08 (×2): qty 1

## 2011-05-08 MED ORDER — ISOSORBIDE MONONITRATE ER 30 MG PO TB24
30.0000 mg | ORAL_TABLET | Freq: Every day | ORAL | Status: DC
  Administered 2011-05-09: 30 mg via ORAL
  Filled 2011-05-08 (×2): qty 1

## 2011-05-08 MED ORDER — SODIUM CHLORIDE 0.9 % IV SOLN
INTRAVENOUS | Status: DC
  Administered 2011-05-08: via INTRAVENOUS

## 2011-05-08 MED ORDER — SIMVASTATIN 20 MG PO TABS
20.0000 mg | ORAL_TABLET | Freq: Every day | ORAL | Status: DC
  Filled 2011-05-08 (×2): qty 1

## 2011-05-08 MED ORDER — NITROGLYCERIN 0.4 MG SL SUBL: 0.4000 mg | SUBLINGUAL_TABLET | SUBLINGUAL | Status: DC | PRN

## 2011-05-08 MED ORDER — GI COCKTAIL ~~LOC~~
30.0000 mL | Freq: Once | ORAL | Status: AC
  Administered 2011-05-08: 30 mL via ORAL
  Filled 2011-05-08: qty 30

## 2011-05-08 MED ORDER — NITROGLYCERIN 2 % TD OINT
1.0000 [in_us] | TOPICAL_OINTMENT | Freq: Once | TRANSDERMAL | Status: AC
  Administered 2011-05-08: 1 [in_us] via TOPICAL
  Filled 2011-05-08: qty 30

## 2011-05-08 MED ORDER — NITROGLYCERIN 0.4 MG SL SUBL
0.4000 mg | SUBLINGUAL_TABLET | SUBLINGUAL | Status: DC | PRN
  Filled 2011-05-08: qty 25

## 2011-05-08 MED ORDER — ONDANSETRON HCL 4 MG PO TABS: 4.0000 mg | ORAL_TABLET | Freq: Four times a day (QID) | ORAL | Status: DC | PRN

## 2011-05-08 MED ORDER — METOPROLOL TARTRATE 25 MG PO TABS
25.0000 mg | ORAL_TABLET | Freq: Two times a day (BID) | ORAL | Status: DC
  Filled 2011-05-08 (×4): qty 1

## 2011-05-08 NOTE — ED Notes (Signed)
Pt states he has had pain in his left chest off and on today  Pt states the pain radiates down his left arm  Pt states the pain is sharp in nature  Denies any other sxs associated with the pain  Rates pain a 6/10 at this time

## 2011-05-08 NOTE — H&P (Signed)
PCP:   [facility]  Chief Complaint:  Left sided chest pain  HPI: 46 year old gentleman with h/o CAD, with stent in 2009, came in for substernal chest pain started this morning around 7, at rest, intermittent, sharp, associated with some diaphoresis. He denies any palpitations or syncope, or sob. He went to work this afternoon, but had severe left sided chest pain with radiation to the left arm, and came to ED. He states his chest pain is similar to the pain before the stent. He had a recent Cath done in 10/12 which showed patent stent and a stress test in 7/12, which was essentially negative. In ED he was found to have negative troponins, normal EKG without any ST T wave changes. His chest pain on evaluation has almost resolved. Cardiology consult was obtained by ED physician, who will consult on the patient in the morning. He is being admited to hospitalist service to r/o ACS.   Review of Systems:  The patient denies anorexia, fever, weight loss,, vision loss, decreased hearing, hoarseness,  syncope, dyspnea on exertion, peripheral edema, balance deficits, hemoptysis, abdominal pain, melena, hematochezia,  hematuria, incontinence, genital sores, muscle weakness, suspicious skin lesions, transient blindness, difficulty walking, depression, unusual weight change, abnormal bleeding, enlarged lymph nodes, angioedema, and breast masses.  Past Medical History: Past Medical History  Diagnosis Date  . Coronary arteriosclerosis 2009    stents placed  . Hypercholesterolemia    Past Surgical History  Procedure Date  . Back surgery     fusion-lumbar  . Coronary angioplasty with stent placement     Medications: Prior to Admission medications   Medication Sig Start Date End Date Taking? Authorizing Provider  aspirin EC 81 MG tablet Take 81 mg by mouth daily.     Yes Historical Provider, MD  isosorbide mononitrate (IMDUR) 30 MG 24 hr tablet Take 30 mg by mouth daily.     Yes Historical Provider, MD    metoprolol tartrate (LOPRESSOR) 25 MG tablet Take 25 mg by mouth 2 (two) times daily.    Yes Historical Provider, MD  nitroGLYCERIN (NITROSTAT) 0.4 MG SL tablet Place 0.4 mg under the tongue every 5 (five) minutes as needed. Chest pains   Yes Historical Provider, MD  omeprazole (PRILOSEC) 20 MG capsule Take 20 mg by mouth daily.   Yes Historical Provider, MD  simvastatin (ZOCOR) 20 MG tablet Take 20 mg by mouth at bedtime.    Yes Historical Provider, MD    Allergies:  No Known Allergies  Social History:  reports that he has quit smoking. He does not have any smokeless tobacco history on file. He reports that he does not drink alcohol or use illicit drugs.   Family History: History reviewed. No pertinent family history.  Physical Exam: Filed Vitals:   05/08/11 1914 05/08/11 1917 05/08/11 2007  BP: 134/87  126/73  Pulse: 63  61  Temp:  97.7 F (36.5 C)   TempSrc:  Oral   Resp: 14  18  SpO2: 100%  100%   Constitutional: Vital signs reviewed.  Patient is a well-developed and well-nourished  in no acute distress and cooperative with exam. Alert and oriented x3.  Head: Normocephalic and atraumatic Mouth: no erythema or exudates, MMM Eyes: PERRL, EOMI, conjunctivae normal, No scleral icterus.  Neck: Supple, Trachea midline normal ROM, No JVD, mass, thyromegaly, or carotid bruit present.  Cardiovascular: RRR, S1 normal, S2 normal, no MRG, pulses symmetric and intact bilaterally Pulmonary/Chest: CTAB, no wheezes, rales, or rhonchi Abdominal:  Soft. Non-tender, non-distended, bowel sounds are normal, no masses, organomegaly, or guarding present.  GU: no CVA tenderness Musculoskeletal: No joint deformities, erythema, or stiffness, ROM full and no nontender Hematology: no cervical, inginal, or axillary adenopathy.  Neurological: A&O x3, Strenght is normal and symmetric bilaterally, cranial nerve II-XII are grossly intact, no focal motor deficit, sensory intact to light touch bilaterally.   Skin: Warm, dry and intact. No rash, cyanosis, or clubbing.      Labs on Admission:   [facility] 05/08/11 1934  NA 142  K 3.8  CL 105  CO2 27  GLUCOSE 111*  BUN 12  CREATININE 0.85  CALCIUM 10.0  MG --  PHOS --   No results found for this basename: AST:2,ALT:2,ALKPHOS:2,BILITOT:2,PROT:2,ALBUMIN:2 in the last 72 hours No results found for this basename: LIPASE:2,AMYLASE:2 in the last 72 hours  Basename 05/08/11 1934  WBC 7.9  NEUTROABS 4.9  HGB 15.4  HCT 43.7  MCV 82.8  PLT 242    Basename 05/08/11 1934  CKTOTAL 98  CKMB 2.7  CKMBINDEX --  TROPONINI <0.30   No results found for this basename: TSH,T4TOTAL,FREET3,T3FREE,THYROIDAB in the last 72 hours No results found for this basename: VITAMINB12:2,FOLATE:2,FERRITIN:2,TIBC:2,IRON:2,RETICCTPCT:2 in the last 72 hours  Radiological Exams on Admission: Dg Chest 2 View  05/08/2011  *RADIOLOGY REPORT*  Clinical Data: Chest pain and shortness of breath for 1 day. History of coronary stent.  CHEST - 2 VIEW  Comparison: 03/01/2011.  Findings: The heart size and mediastinal contours are normal. The lungs are clear. There is no pleural effusion or pneumothorax. No acute osseous findings are identified.  Telemetry leads overlie the chest.  IMPRESSION: Stable mild chronic lung disease.  No acute cardiopulmonary process.  Original Report Authenticated By: Gerrianne Scale, M.D.    Assessment/Plan Present on Admission:  .Chest pain: resolved with sl nitro. On nitro paste. Is being admitted to r.o ACS. Serial enzymes, tele monitoring and a 12 lead EKG in am. Recent negative cath. Cardiology aware of the patient's admission. On aspirin 325mg , metoprolol and zocor. Will get a fasting lipid panel in am.   Heartburn: on PPI  Hypertension: controlled. Continue with metoprolol, imdur.   DVT prophylaxis: lovenox.   After discussion with the patient, HE is to be a full code.  We will respect these wishes.   Time spent on this patient  including examination and decision-making process: 50 minutes.  Yurianna Tusing [phone] 05/08/2011, 10:56 PM

## 2011-05-08 NOTE — ED Notes (Signed)
MD at bedside. 

## 2011-05-08 NOTE — ED Provider Notes (Signed)
History     CSN: [id number]  Arrival date & time 05/08/11  1859   First MD Initiated Contact with Patient 05/08/11 1924      Chief Complaint  Patient presents with  . Chest Pain    (Consider location/radiation/quality/duration/timing/severity/associated sxs/prior treatment) HPI Comments: Patient presents with left-sided substernal chest pain it radiates into his arm and neck has been intermittent throughout the day. It comes and goes and lasts about 10-15 minutes at a time. He took a nitroglycerin with some relief. He is a history of CAD with one stent and is seeing Dr. Carolanne Grumbling in the past.  Associated with nausea and shortness of breath. No diaphoresis, lightheadedness swelling no vomiting. His last catheterization was in 2012 and showed patent stent. He describes the pain as a dull ache that comes and goes and feels similar to his previous angina pain.  The history is provided by the patient.    Past Medical History  Diagnosis Date  . Coronary arteriosclerosis 2009    stents placed  . Hypercholesterolemia     Past Surgical History  Procedure Date  . Back surgery     fusion-lumbar  . Coronary angioplasty with stent placement     History reviewed. No pertinent family history.  History  Substance Use Topics  . Smoking status: Former Games developer  . Smokeless tobacco: Not on file  . Alcohol Use: No      Review of Systems  Constitutional: Negative for fever and chills.  HENT: Negative for congestion and rhinorrhea.   Respiratory: Positive for chest tightness and shortness of breath. Negative for cough.   Cardiovascular: Positive for chest pain.  Gastrointestinal: Positive for nausea. Negative for vomiting and abdominal pain.  Genitourinary: Negative for dysuria and hematuria.  Musculoskeletal: Negative for back pain.  Neurological: Negative for headaches.    Allergies  Review of patient's allergies indicates no known allergies.  Home Medications   No current  outpatient prescriptions on file.  BP 114/70  Pulse 58  Temp(Src) 97.5 F (36.4 C) (Axillary)  Resp 18  Ht 5\' 11"  (1.803 m)  SpO2 99%  Physical Exam  Constitutional: He is oriented to person, place, and time. He appears well-developed and well-nourished. No distress.  HENT:  Head: Normocephalic and atraumatic.  Mouth/Throat: Oropharynx is clear and moist. No oropharyngeal exudate.  Eyes: Conjunctivae are normal. Pupils are equal, round, and reactive to light.  Neck: Normal range of motion. Neck supple.  Cardiovascular: Normal rate, regular rhythm and normal heart sounds.   No murmur heard. Pulmonary/Chest: Effort normal and breath sounds normal. No respiratory distress.  Abdominal: Soft. There is no tenderness. There is no rebound and no guarding.  Musculoskeletal: Normal range of motion. He exhibits no edema and no tenderness.  Neurological: He is alert and oriented to person, place, and time. No cranial nerve deficit.  Skin: Skin is warm.    ED Course  Procedures (including critical care time)  Labs Reviewed  BASIC METABOLIC PANEL - Abnormal; Notable for the following:    Glucose, Bld 111 (*)    All other components within normal limits  CBC  DIFFERENTIAL  PROTIME-INR  CARDIAC PANEL(CRET KIN+CKTOT+MB+TROPI)  URINALYSIS, ROUTINE W REFLEX MICROSCOPIC  POCT I-STAT TROPONIN I  CARDIAC PANEL(CRET KIN+CKTOT+MB+TROPI)  COMPREHENSIVE METABOLIC PANEL  CBC  PROTIME-INR  APTT  CARDIAC PANEL(CRET KIN+CKTOT+MB+TROPI)  TSH  CARDIAC PANEL(CRET KIN+CKTOT+MB+TROPI)   Dg Chest 2 View  05/08/2011  *RADIOLOGY REPORT*  Clinical Data: Chest pain and shortness of breath for 1  day. History of coronary stent.  CHEST - 2 VIEW  Comparison: 03/01/2011.  Findings: The heart size and mediastinal contours are normal. The lungs are clear. There is no pleural effusion or pneumothorax. No acute osseous findings are identified.  Telemetry leads overlie the chest.  IMPRESSION: Stable mild chronic lung  disease.  No acute cardiopulmonary process.  Original Report Authenticated By: Gerrianne Scale, M.D.     1. ACS (acute coronary syndrome)       MDM  Chest pain radiating to left arm and shortness of breath similar to previous angina. No significant CAD on last cath in October. Patient's history concerning for angina. No changes on EKG. Troponin negative. Case discussed with Dr. Donnie Aho who will evaluate patietnt.  D/w Dr. Shirlee Latch.  Patient had recent clean cath in October.  No EKG changes tonight, negative troponin.  Recommends serial enzymes.  If still having pain will need medical admit.  Date: 05/08/2011  Rate: 65  Rhythm: normal sinus rhythm  QRS Axis: normal  Intervals: normal  ST/T Wave abnormalities: normal  Conduction Disutrbances:none  Narrative Interpretation:   Old EKG Reviewed: unchanged          Glynn Octave, MD 05/09/11 [phone]

## 2011-05-09 ENCOUNTER — Encounter (HOSPITAL_COMMUNITY): Payer: Self-pay | Admitting: Nurse Practitioner

## 2011-05-09 DIAGNOSIS — E785 Hyperlipidemia, unspecified: Secondary | ICD-10-CM | POA: Insufficient documentation

## 2011-05-09 DIAGNOSIS — F1511 Other stimulant abuse, in remission: Secondary | ICD-10-CM | POA: Insufficient documentation

## 2011-05-09 DIAGNOSIS — Z87891 Personal history of nicotine dependence: Secondary | ICD-10-CM | POA: Insufficient documentation

## 2011-05-09 DIAGNOSIS — R079 Chest pain, unspecified: Secondary | ICD-10-CM

## 2011-05-09 DIAGNOSIS — E7800 Pure hypercholesterolemia, unspecified: Secondary | ICD-10-CM | POA: Insufficient documentation

## 2011-05-09 DIAGNOSIS — I2510 Atherosclerotic heart disease of native coronary artery without angina pectoris: Secondary | ICD-10-CM | POA: Insufficient documentation

## 2011-05-09 LAB — CARDIAC PANEL(CRET KIN+CKTOT+MB+TROPI)
CK, MB: 2.1 ng/mL (ref 0.3–4.0)
CK, MB: 5 ng/mL — ABNORMAL HIGH (ref 0.3–4.0)
Relative Index: INVALID (ref 0.0–2.5)
Relative Index: 2.2 (ref 0.0–2.5)
Total CK: 72 U/L (ref 7–232)
Troponin I: <0.3 ng/mL (ref <0.30)
Troponin I: <0.3 ng/mL (ref <0.30)

## 2011-05-09 LAB — RAPID URINE DRUG SCREEN, HOSP PERFORMED
Barbiturates: NOT DETECTED
Cocaine: NOT DETECTED
Opiates: NOT DETECTED
Tetrahydrocannabinol: NOT DETECTED

## 2011-05-09 LAB — COMPREHENSIVE METABOLIC PANEL
ALT: 15 U/L (ref 0–53)
AST: 14 U/L (ref 0–37)
Albumin: 3.7 g/dL (ref 3.5–5.2)
Alkaline Phosphatase: 51 U/L (ref 39–117)
Calcium: 9 mg/dL (ref 8.4–10.5)
GFR calc Af Amer: >90 mL/min (ref >90)
Glucose, Bld: 98 mg/dL (ref 70–99)
Potassium: 3.8 mEq/L (ref 3.5–5.1)
Sodium: 139 mEq/L (ref 135–145)
Total Protein: 6.4 g/dL (ref 6.0–8.3)

## 2011-05-09 LAB — CBC
HCT: 38.6 % — ABNORMAL LOW (ref 39.0–52.0)
Hemoglobin: 13.7 g/dL (ref 13.0–17.0)
MCHC: 35.5 g/dL (ref 30.0–36.0)
MCV: 82.1 fL (ref 78.0–100.0)
RDW: 12.7 % (ref 11.5–15.5)

## 2011-05-09 LAB — PROTIME-INR: INR: 1.06 (ref 0.00–1.49)

## 2011-05-09 NOTE — Progress Notes (Signed)
05/09/11 1850 Patient has been educated. Patient has discharge instructions. Patient is eager to go back to [employer].

## 2011-05-09 NOTE — Progress Notes (Signed)
05/09/11 0951 The HR is 48-50. Notified MD. MD notified RN to hold Metoprolol 25 mg tablet this am.

## 2011-05-09 NOTE — Consult Note (Signed)
CARDIOLOGY CONSULT NOTE  Patient ID: [name] MRN: [id number], DOB/AGE: [birth year]   Admit date: 05/08/2011 Date of Consult: 05/09/2011   Primary Physician: [facility] Primary Cardiologist: new, J. Michael Walrath, MD  Pt. Profile  46 year old male with prior history of CAD who was admitted March 7 with recurrent, intermittent chest pain.  Problem List  Past Medical History  Diagnosis Date  . Coronary arteriosclerosis     a. PCI LAD 2009 in [place];  b. 12/2010 Cath: LM: nl, LAD: patent stent mid, 25 mid, LCX nl, RCA: non dominant, nl, EF 50%  . Hypercholesterolemia   . History of methamphetamine abuse     a. Used x 6 yrs, quit July 2013  . History of tobacco abuse     a. 41 yrs, up to 3 ppd, quit July 2012    Past Surgical History  Procedure Date  . Back surgery     fusion-lumbar  . Coronary angioplasty with stent placement      Allergies  No Known Allergies  HPI   46 year old male with the above problem list. He is relatively recently status post cardiac catheterization performed by [facility] cardiology in October of 2012 revealing patency of previously placed LAD stent and otherwise nonobstructive disease and low normal LV function. He has since follow up with Dr. Mayford Knife but apparently has been fired by their practice. Since his last catheterization, patient has been relatively active, working full-time at [employer] which requires a fair amount of exertion. He had not been having a chest pain.  Yesterday, after returning home from work and (he works the night shift) he noted a burning in his left chest which he thought was indigestion. He went to sleep or little while and then got up just before lunch and noted that the burning persisted. He ate lunch without change in the discomfort and then went back to bed. He was able to fall asleep. When he awoke prior to dinner he continued to have intermittent left chest burning with mild dyspnea. After  dinner, he readied himself for work and then while in a friend's car on the way to work, he felt as though he was "shot with a shotgun" in the left shoulder and arm. This scared him quite a bit and he took a sub-little nitroglycerin at which point both the left arm and chest discomfort began to ease off.  He then presented to [facility] long where his ECG was nonacute and his cardiac enzymes were negative. Patient had no further chest burning and enzymes remain negative. He reports compliance with his home medications. He says that although his discomfort in October of 2012 is more like a chest heaviness, in 2009, when his stent was placed, he says his chest pain was like being kicked by a donkey or shot with a shotgun.  Inpatient Medications     . aspirin  324 mg Oral Once  . aspirin  325 mg Oral Daily  . enoxaparin  40 mg Subcutaneous QHS  . gi cocktail  30 mL Oral Once  . isosorbide mononitrate  30 mg Oral Daily  . metoprolol tartrate  25 mg Oral BID  . nitroGLYCERIN  1 inch Topical Once  . pantoprazole  40 mg Oral Q1200  . simvastatin  20 mg Oral QHS    Family History Family History  Problem Relation Age of Onset  . Heart attack Father     died @ 8  . Coronary artery disease Mother  alive in late 31's  13 brothers & sisters - not close with them and doesn't know their medical histories.   Social History History   Social History  . Marital Status: Single    Spouse Name: N/A    Number of Children: N/A  . Years of Education: N/A   Occupational History  . Not on file.   Social History Main Topics  . Smoking status: Former Smoker -- 2.0 packs/day for 41 years    Types: Cigarettes    Quit date: 09/01/2010  . Smokeless tobacco: Never Used   Comment: smoked up to 3 ppd x 41 yrs  . Alcohol Use: No  . Drug Use: No     previously used methamphetamines - quit 09/2010  . Sexually Active: Not on file   Other Topics Concern  . Not on file   Social History Narrative   Lives in  [place].  Step-dtr near-by.  Works for a [employer].     Review of Systems  General:  No chills, fever, night sweats or weight changes.  Cardiovascular: Chest pain and burning with mild dyspnea as outlined above.  No edema, orthopnea, palpitations, paroxysmal nocturnal dyspnea. Dermatological: No rash, lesions/masses Respiratory: No cough, dyspnea Urologic: No hematuria, dysuria Abdominal:   No nausea, vomiting, diarrhea, bright red blood per rectum, melena, or hematemesis Neurologic:  No visual changes, wkns, changes in mental status. All other systems reviewed and are otherwise negative except as noted above.  Physical Exam  Blood pressure 103/63, pulse 48, temperature 98.1 F (36.7 C), temperature source Oral, resp. rate 16, height 5\' 11"  (1.803 m), weight 165 lb 11.2 oz (75.161 kg), SpO2 98.00%.  General: Pleasant, NAD Psych: Normal affect. Neuro: Alert and oriented X 3. Moves all extremities spontaneously. HEENT: Normal  Neck: Supple without bruits or JVD. Lungs:  Resp regular and unlabored, CTA. Heart: RRR no s3, s4, or murmurs. Abdomen: Soft, non-tender, non-distended, BS + x 4.  Extremities: No clubbing, cyanosis or edema. DP/PT/Radials 2+ and equal bilaterally.  Labs   [facility] 05/09/11 1103 05/09/11 0347 05/08/11 1934  CKTOTAL 232 72 98  CKMB 5.0* 2.1 2.7  TROPONINI <0.30 <0.30 <0.30   Lab Results  Component Value Date   WBC 6.1 05/09/2011   HGB 13.7 05/09/2011   HCT 38.6* 05/09/2011   MCV 82.1 05/09/2011   PLT 223 05/09/2011     Lab 05/09/11 0347  NA 139  K 3.8  CL 106  CO2 26  BUN 10  CREATININE 0.84  CALCIUM 9.0  PROT 6.4  BILITOT 0.4  ALKPHOS 51  ALT 15  AST 14  GLUCOSE 98   Lab Results  Component Value Date   CHOL 127 12/20/2010   HDL 37* 12/20/2010   LDLCALC 64 12/20/2010   TRIG 128 12/20/2010    Radiology/Studies  Dg Chest 2 View  05/08/2011  *RADIOLOGY REPORT*  Clinical Data: Chest pain and shortness of breath for 1 day. History of  coronary stent.  CHEST - 2 VIEW  Comparison: 03/01/2011.  Findings: The heart size and mediastinal contours are normal. The lungs are clear. There is no pleural effusion or pneumothorax. No acute osseous findings are identified.  Telemetry leads overlie the chest.  IMPRESSION: Stable mild chronic lung disease.  No acute cardiopulmonary process.  Original Report Authenticated By: Gerrianne Scale, M.D.    ECG  Regular sinus rhythm, 65, no acute ST or T changes.  ASSESSMENT AND PLAN  1. Chest pain without objective evidence of ischemia/CAD:  Patient presented to the ED last night with roughly a 12 hour history of intermittent left-sided chest burning and mild dyspnea followed by left shoulder pain. His discomfort appears to have been at least somewhat nitrate responsive. Despite prolonged symptoms, his enzymes are negative and ECG is nonacute. He would like to avoid an invasive evaluation and this seems appropriate. His last cath performed in October 2012 showed patent LAD stent and otherwise nonobstructive disease. Continue aspirin, beta blocker, statin, and long-acting nitrate therapy. We will consider an exercise Myoview to rule out ischemia.  2. Hyperlipidemia:  LDL of 64 in October of 2012. Continue statin therapy.  3.  History of tobacco and methamphetamine abuse:  Patient quit in July 2012. He was congratulated and encouraged.   Signed, Nicolasa Ducking, NP 05/09/2011, 1:20 PM   History reviewed with the patient, no changes to be made.  He has a history of previous stenting to the LAD but patent stent Oct 2012.  Now he presents with chest pain that is different than his other presentations. He "thought I was having gas."  Symptoms are described above.  No objective evidence of ischemia.  The patient exam reveals:  Lungs clear, COR no murmurs no rubs, Abd Positive bowel sounds, no rebound no guarding, Ext no edema.  All available labs, radiology testing, previous records reviewed. EKG with  acute changesAgree with documented assessment and plan. No indication for further in patient testing.  We will arrange an outpatient ETT.   Fayrene Fearing Kalyan Barabas  3:47 PM 01/17/2011

## 2011-05-09 NOTE — Discharge Summary (Signed)
Physician Discharge Summary  [name] WUJ:[id number] DOB: 1965/05/03 DOA: 05/08/2011  PCP: Georganna Skeans, MD, MD Cardiologist: Rollene Rotunda, M.D.  Admit date: 05/08/2011 Discharge date: 05/09/2011  Discharge Diagnoses:  1. Atypical chest pain, resolved 2. History of coronary artery disease  Discharge Condition: Improved  Disposition: Home  History of present illness:  46 year old with history of coronary stent 2009 presented to the emergency department with substernal chest pain that was intermittent, sharp in nature. No shortness of breath. Workup in the emergency department was negative, cardiology is consulted and deferred evaluation to the following morning.    12/23/2010, left heart catheterization: Patent left anterior descending stent. No hemodynamically significant coronary artery disease. Ejection fraction 50%. Recommended: Aggressive secondary prevention.   12/24/2010, hospitalization: Discharge diagnoses: Unstable angina, coronary artery disease, next methamphetamine abuser.  Hospital Course:  Mr. [name] was admitted to the medical floor. He ruled out with serial cardiac enzymes. Chest pain has resolved. Telemetry was unremarkable. He was seen by cardiology in consultation. No changes were made to his medical regimen. He is felt to be stable for discharge Will followup with cardiology in the outpatient setting for nuclear stress testing. 1. Chest pain: Ruled out with serial cardiac enzymes. Currently pain-free. Suspect noncardiac etiology.  2. Hyperlipidemia: Continue Zocor.  3. History of coronary artery disease with coronary stent placement: Continue aspirin, metoprolol, Imdur, Zocor. Followup with cardiology's and outpatient for outpatient nuclear stress testing. 4. History of methamphetamine use according to chart review: Urine drug screen negative.   Consultants:  Cardiology  Procedures:  None  Discharge Instructions  Discharge Orders    Future  Appointments: Provider: Department: Dept Phone: Center:   05/19/2011 3:00 PM Rosalio Macadamia, NP [facility] [phone] None     Joint Appt [facility] [phone] None   06/24/2011 10:30 AM Rollene Rotunda, MD [facility] [phone] [facility]     Future Orders Please Complete By Expires   Diet - low sodium heart healthy      Activity as tolerated - No restrictions        Medication List  As of 05/09/2011  5:17 PM   TAKE these medications         aspirin EC 81 MG tablet   Take 81 mg by mouth daily.      isosorbide mononitrate 30 MG 24 hr tablet   Commonly known as: IMDUR   Take 30 mg by mouth daily.      metoprolol tartrate 25 MG tablet   Commonly known as: LOPRESSOR   Take 25 mg by mouth 2 (two) times daily.      nitroGLYCERIN 0.4 MG SL tablet   Commonly known as: NITROSTAT   Place 0.4 mg under the tongue every 5 (five) minutes as needed. Chest pains      omeprazole 20 MG capsule   Commonly known as: PRILOSEC   Take 20 mg by mouth daily.      simvastatin 20 MG tablet   Commonly known as: ZOCOR   Take 20 mg by mouth at bedtime.           Follow-up Information    Follow up with Georganna Skeans, MD on 07/02/2011. (at 10:30 am)    Contact information:   [address] [place] [address] [phone]       Follow up with [facility] on 05/19/2011. (3:00)    Contact information:   [address] [place] [phone]      Follow  up with Rollene Rotunda, MD on 06/24/2011. (10:30 AM)    Contact information:   [address], [address] [place] [address] [phone]           The results of significant diagnostics from this hospitalization (including imaging, microbiology, ancillary and laboratory) are listed below for reference.    Significant Diagnostic Studies: Dg Chest 2 View  05/08/2011  *RADIOLOGY REPORT*  Clinical Data: Chest pain and shortness of breath for 1  day. History of coronary stent.  CHEST - 2 VIEW  Comparison: 03/01/2011.  Findings: The heart size and mediastinal contours are normal. The lungs are clear. There is no pleural effusion or pneumothorax. No acute osseous findings are identified.  Telemetry leads overlie the chest.  IMPRESSION: Stable mild chronic lung disease.  No acute cardiopulmonary process.  Original Report Authenticated By: Gerrianne Scale, M.D.   Labs: Basic Metabolic Panel:  Lab 05/09/11 2130 05/08/11 1934  NA 139 142  K 3.8 3.8  CL 106 105  CO2 26 27  GLUCOSE 98 111*  BUN 10 12  CREATININE 0.84 0.85  CALCIUM 9.0 10.0  MG -- --  PHOS -- --   Liver Function Tests:  Lab 05/09/11 0347  AST 14  ALT 15  ALKPHOS 51  BILITOT 0.4  PROT 6.4  ALBUMIN 3.7   CBC:  Lab 05/09/11 0347 05/08/11 1934  WBC 6.1 7.9  NEUTROABS -- 4.9  HGB 13.7 15.4  HCT 38.6* 43.7  MCV 82.1 82.8  PLT 223 242   Cardiac Enzymes:  Lab 05/09/11 1103 05/09/11 0347 05/08/11 1934  CKTOTAL 232 72 98  CKMB 5.0* 2.1 2.7  CKMBINDEX -- -- --  TROPONINI <0.30 <0.30 <0.30    Time coordinating discharge: 35 minutes.  Signed:  Brendia Sacks, MD  [facility] 05/09/2011, 5:17 PM

## 2011-05-09 NOTE — Progress Notes (Signed)
Spoke with patient at bedside. He currently lives in a group home/halfway house, recently d/ced from a long term rehab for substance abuse. Currently employed, uses bus for transportation, active with [facility] for medical care, has orange card to assist with medications. Appt made with [facility] for f/u care.

## 2011-05-09 NOTE — Progress Notes (Addendum)
PROGRESS NOTE  Dempsy Damiano BJS:[id number] DOB: 11/04/1965 DOA: 05/08/2011 PCP: No primary provider on file. Cardiologist: [facility], patient has been fired from this practice  Brief narrative: 46 year old with history of coronary stent 2009 presented to the emergency department with substernal chest pain that was intermittent, sharp in nature. No shortness of breath. Workup in the emergency department was negative, cardiology is consulted and deferred evaluation to the following morning.   12/23/2010, left heart catheterization: Patent left anterior descending stent. No hemodynamically significant coronary artery disease. Ejection fraction 50%. Recommended: Aggressive secondary prevention.  12/24/2010, hospitalization: Discharge diagnoses: Unstable angina, coronary artery disease, next methamphetamine abuser.  Past medical history: Coronary artery disease with coronary stent placement 2009, hyperlipidemia, methamphetamine use  Consultants:  Cardiology  Procedures:  None  Antibiotics:  None   Interim History: Chart reviewed in detail.  Subjective: Intermittent chest pain. Currently no chest pain or shortness of breath.  Objective: Filed Vitals:   05/08/11 1917 05/08/11 2007 05/08/11 2323 05/09/11 0539  BP:  126/73 114/70 104/57  Pulse:  61 58 54  Temp: 97.7 F (36.5 C)  97.5 F (36.4 C) 98.1 F (36.7 C)  TempSrc: Oral  Axillary Oral  Resp:  18 18 16   Height:   5\' 11"  (1.803 m)   Weight:    75.161 kg (165 lb 11.2 oz)  SpO2:  100% 99% 98%    Intake/Output Summary (Last 24 hours) at 05/09/11 0825 Last data filed at 05/09/11 0600  Gross per 24 hour  Intake 554.17 ml  Output    800 ml  Net -245.83 ml    Exam:   General:  Appears calm and comfortable.  Cardiovascular: Regular rate and rhythm. No murmur, rub, gallop. No lower extremity edema.  Telemetry: Sinus rhythm. No acute changes.  Respiratory: Clear to auscultation bilaterally. No  wheezes, rales, rhonchi. Normal respiratory effort.  Data Reviewed: Basic Metabolic Panel:  Lab 05/09/11 6073 05/08/11 1934  NA 139 142  K 3.8 3.8  CL 106 105  CO2 26 27  GLUCOSE 98 111*  BUN 10 12  CREATININE 0.84 0.85  CALCIUM 9.0 10.0  MG -- --  PHOS -- --   Liver Function Tests:  Lab 05/09/11 0347  AST 14  ALT 15  ALKPHOS 51  BILITOT 0.4  PROT 6.4  ALBUMIN 3.7   CBC:  Lab 05/09/11 0347 05/08/11 1934  WBC 6.1 7.9  NEUTROABS -- 4.9  HGB 13.7 15.4  HCT 38.6* 43.7  MCV 82.1 82.8  PLT 223 242   Cardiac Enzymes:  Lab 05/09/11 0347 05/08/11 1934  CKTOTAL 72 98  CKMB 2.1 2.7  CKMBINDEX -- --  TROPONINI <0.30 <0.30   Studies: Dg Chest 2 View  05/08/2011  *RADIOLOGY REPORT*  Clinical Data: Chest pain and shortness of breath for 1 day. History of coronary stent.  CHEST - 2 VIEW  Comparison: 03/01/2011.  Findings: The heart size and mediastinal contours are normal. The lungs are clear. There is no pleural effusion or pneumothorax. No acute osseous findings are identified.  Telemetry leads overlie the chest.  IMPRESSION: Stable mild chronic lung disease.  No acute cardiopulmonary process.  Original Report Authenticated By: Gerrianne Scale, M.D.    Scheduled Meds:   . aspirin  324 mg Oral Once  . aspirin  325 mg Oral Daily  . enoxaparin  40 mg Subcutaneous QHS  . gi cocktail  30 mL Oral Once  . isosorbide mononitrate  30 mg Oral Daily  . metoprolol  tartrate  25 mg Oral BID  . nitroGLYCERIN  1 inch Topical Once  . pantoprazole  40 mg Oral Q1200  . simvastatin  20 mg Oral QHS   Continuous Infusions:   . sodium chloride 50 mL/hr at 05/08/11 2343   EKG independently reviewed March 7: Sinus rhythm. No acute changes.   Assessment/Plan: 1. Chest pain: Rule out with serial cardiac enzymes. Currently pain-free. Suspect noncardiac etiology. Further recommendations per cardiology. 2. Hyperlipidemia: Continue Zocor. 3. History of coronary artery disease with  coronary stent placement: Continue aspirin, metoprolol, Imdur, Zocor. Further recommendations per cardiology. 4. History of methamphetamine use according to chart review: Check urine drug screen. Previous transcription 2012 does not confirm this.  Patient previously followed by [facility]. Case discussed with Dr. Mayford Knife. The patient has been fired from [facility]. Consult unassigned cardiology.  Code Status: Full code Family Communication: Discussed with family at bedside Disposition Plan: Home when improved.   Brendia Sacks, MD  [facility] Pager [phone] 05/09/2011, 8:25 AM    LOS: 1 day

## 2011-05-09 NOTE — Discharge Instructions (Signed)
PLEASE REMEMBER TO BRING ALL OF YOUR MEDICATIONS TO EACH OF YOUR FOLLOW-UP OFFICE VISITS. Angina Angina is chest discomfort caused by lack of oxygen to the heart muscle. It is a warning sign that there is a blood flow problem to your heart. Angina is referred to as either stable or unstable. Stable angina often happens with the same kind of activity, lasts a few minutes, and feels the same each time. Unstable angina has no pattern, no warning, lasts longer, and is more serious. Unstable angina might predict a heart attack. HOME CARE   Understand how to take your medicine and what side effects to expect.   Do not stop the medicines.   Do not change how much you take (dosage) on your own.   Write down any side effects. Tell your doctor what they are.   Mild exercise may help. Start exercising only as told by your doctor.   You can still have a sexual relationship if it does not cause angina. Tell your doctor if it does.   Stop smoking. Do not use gum or patches that help people quit smoking until you check with your doctor.   Lose weight if you are overweight. Eat a heart-healthy diet that is low in fat and salt.   Keep all follow-up visits with your doctor. This is important!  GET HELP RIGHT AWAY IF:   Your angina seems to happen more often or lasts longer.   You are having side effects from your medicine.   Your chest pain spreads to the arms, back, neck, or jaw (especially if the pain is crushing or pressure-like).   You are sweating, feel sick to your stomach (nauseous), or have shortness of breath.   You have an attack that does not get better after rest or taking medicine.   You wake from sleep with chest pain.   You feel dizzy, faint, or feel very tired (fatigued).   You have chest pain that is different from your usual angina.  Any of these problems may be a sign of a serious problem that is an emergency. Do not wait to see if the problems will go away. Get medical help  right away. Call your local emergency services (911 in [place] Do not drive yourself to the hospital. MAKE SURE YOU:   Understand these instructions.   Will watch your condition.   Will get help right away if you are not doing well or get worse.  Document Released: 08/06/2007 Document Revised: 02/06/2011 Document Reviewed: 08/06/2007 [facility] Patient Information 2012 [place], [place].

## 2011-05-19 ENCOUNTER — Ambulatory Visit (INDEPENDENT_AMBULATORY_CARE_PROVIDER_SITE_OTHER): Payer: Self-pay | Admitting: Nurse Practitioner

## 2011-05-19 ENCOUNTER — Encounter: Payer: Self-pay | Admitting: Nurse Practitioner

## 2011-05-19 DIAGNOSIS — R079 Chest pain, unspecified: Secondary | ICD-10-CM

## 2011-05-19 DIAGNOSIS — I2510 Atherosclerotic heart disease of native coronary artery without angina pectoris: Secondary | ICD-10-CM

## 2011-05-19 NOTE — Patient Instructions (Signed)
We need to arrange for a stress test with imaging.  Stay on your current medicines.

## 2011-05-19 NOTE — Progress Notes (Signed)
Exercise Treadmill Test  Pre-Exercise Testing Evaluation Rhythm: sinus bradycardia  Rate: 55   PR:  .15 QRS:  .09  QT:  .43 QTc: .41           Test  Exercise Tolerance Test Ordering MD: Angelina Sheriff, MD  Interpreting MD:  Leighton Parody, NP  Unique Test No: 1   Treadmill:  1  Indication for ETT: chest pain - rule out ischemia  Contraindication to ETT: No   Stress Modality: exercise - treadmill  Cardiac Imaging Performed: non   Protocol: standard Bruce - maximal  Max BP:  157/65  Max MPHR (bpm):  137 85% MPR (bpm):  N/A  MPHR obtained (bpm):  137 % MPHR obtained:  79%  Reached 85% MPHR (min:sec):  N/A Total Exercise Time (min-sec):  10:00  Workload in METS:  11.7 Borg Scale: 20  Reason ETT Terminated:  patient's desire to stop    ST Segment Analysis At Rest: normal ST segments - no evidence of significant ST depression With Exercise: significant ischemic ST depression  Other Information Arrhythmia:  No Angina during ETT:  absent (0) Quality of ETT:  non-diagnostic  ETT Interpretation:  Nondiagnostic due to target heart rate not achieved and with ST depression with exercise  Comments: Patient was referred for GXT following recent admission for atypical chest pain. Has known CAD with remote stent. Exercised on the standard Bruce protocol for a total of 10 minutes. Fair exercise tolerance. Adequate blood pressure response. Clinically negative for angina. EKG with diffuse ST/T wave changes. Target heart rate was not achieved.   Recommendations: Have reviewed the EKG tracings with Dr. Antoine Poche. Will refer for stress Myoview and hold beta blocker per protocol due to non-diagnostic GXT with EKG changes.

## 2011-05-27 ENCOUNTER — Ambulatory Visit (HOSPITAL_COMMUNITY): Payer: Self-pay | Attending: Cardiology | Admitting: Radiology

## 2011-05-27 DIAGNOSIS — Z8249 Family history of ischemic heart disease and other diseases of the circulatory system: Secondary | ICD-10-CM | POA: Insufficient documentation

## 2011-05-27 DIAGNOSIS — R079 Chest pain, unspecified: Secondary | ICD-10-CM | POA: Insufficient documentation

## 2011-05-27 DIAGNOSIS — I2510 Atherosclerotic heart disease of native coronary artery without angina pectoris: Secondary | ICD-10-CM | POA: Insufficient documentation

## 2011-05-27 DIAGNOSIS — E785 Hyperlipidemia, unspecified: Secondary | ICD-10-CM | POA: Insufficient documentation

## 2011-05-27 DIAGNOSIS — R0989 Other specified symptoms and signs involving the circulatory and respiratory systems: Secondary | ICD-10-CM | POA: Insufficient documentation

## 2011-05-27 DIAGNOSIS — Z87891 Personal history of nicotine dependence: Secondary | ICD-10-CM | POA: Insufficient documentation

## 2011-05-27 DIAGNOSIS — R0602 Shortness of breath: Secondary | ICD-10-CM | POA: Insufficient documentation

## 2011-05-27 DIAGNOSIS — R000 Tachycardia, unspecified: Secondary | ICD-10-CM | POA: Insufficient documentation

## 2011-05-27 DIAGNOSIS — R0609 Other forms of dyspnea: Secondary | ICD-10-CM | POA: Insufficient documentation

## 2011-05-27 MED ORDER — TECHNETIUM TC 99M TETROFOSMIN IV KIT
10.0000 | PACK | Freq: Once | INTRAVENOUS | Status: AC | PRN
  Administered 2011-05-27: 10 via INTRAVENOUS

## 2011-05-27 MED ORDER — TECHNETIUM TC 99M TETROFOSMIN IV KIT
30.0000 | PACK | Freq: Once | INTRAVENOUS | Status: AC | PRN
  Administered 2011-05-27: 30 via INTRAVENOUS

## 2011-05-27 NOTE — Progress Notes (Signed)
[facility] 3 NUCLEAR MED [address] [place] [address] [phone]  Cardiology Nuclear Med Study  [name] is a 46 y.o. male     MRN : [id number]     DOB: 1965-04-20  Procedure Date: 05/27/2011  Nuclear Med Background Indication for Stress Test:  Evaluation for Ischemia and Stent Patency History: 7/02'ECHO-EF:50%,05/19/11'GXT- significant ST dep with exercise,10/12' Heart Cath -pat LAD stent,EF:50%,09'MI,7/12'Mocardial Pefusion Study 0 isch,EF:48%;mld diaph .attenation.09' STENTS-LAD(in [place]);CAD Cardiac Risk Factors: Family History - CAD, History of Smoking and Lipids  Symptoms:  Chest Pain (last date of chest discomfort [facility] 05/08/11 chest pain with -enzymes/ekg), DOE, Rapid HR and SOB   Nuclear Pre-Procedure Caffeine/Decaff Intake:  None> 12 hrs NPO After: 5:00pm yesterday   Lungs:  clear O2 Sat: 97% on room air. IV 0.9% NS with Angio Cath:  20g  IV Site: R Antecubital x 1, tolerated well IV Started by:  Irean Hong, RN  Chest Size (in):  38 Cup Size: n/a  Height: 5\' 11"  (1.803 m)  Weight:  172 lb (78.019 kg)  BMI:  Body mass index is 23.99 kg/(m^2). Tech Comments:  Held metoprolol x 24 hrs    Nuclear Med Study 1 or 2 day study: 1 day  Stress Test Type:  Stress  Reading MD: Olga Millers, MD  Order Authorizing Provider:  Rollene Rotunda, MD  Resting Radionuclide: Technetium 60m Tetrofosmin  Resting Radionuclide Dose: 11.0 mCi   Stress Radionuclide:  Technetium 68m Tetrofosmin  Stress Radionuclide Dose: 33.0 mCi           Stress Protocol Rest HR: 54 Stress HR: 162  Rest BP: 117/68 Stress BP: 150/70  Exercise Time (min): 10:23min METS: 11.20   Predicted Max HR: 173 bpm % Max HR: 93.64 bpm Rate Pressure Product: 21308   Dose of Adenosine (mg):  n/a Dose of Lexiscan: n/a mg  Dose of Atropine (mg): n/a Dose of Dobutamine: n/a mcg/kg/min (at max HR)  Stress Test Technologist: Frederick Peers, EMT-P  Nuclear Technologist:  Doyne Keel,  CNMT     Rest Procedure:  Myocardial perfusion imaging was performed at rest 45 minutes following the intravenous administration of Technetium 65m Tetrofosmin. Rest ECG: SB  Stress Procedure:  The patient performed treadmill exercise using a Bruce  Protocol for 10:01 minutes. The patient stopped due to SOB and denied any chest pain.  There were non specific ST-T wave changes.  Technetium 84m Tetrofosmin was injected at peak exercise and myocardial perfusion imaging was performed after a brief delay. Stress ECG: Significant ST abnormalities consistent with ischemia.  QPS Raw Data Images:  Acquisition technically good; normal left ventricular size. Stress Images:  There is decreased uptake in the inferior wall. Rest Images:  There is decreased uptake in the inferior wall. Subtraction (SDS):  There is a fixed defect that is most consistent with a previous infarction. Transient Ischemic Dilatation (Normal <1.22):  0.99 Lung/Heart Ratio (Normal <0.45):  0.40  Quantitative Gated Spect Images QGS EDV:  116 ml QGS ESV:  53 ml  Impression Exercise Capacity:  Good exercise capacity. BP Response:  Normal blood pressure response. Clinical Symptoms:  There is dyspnea. ECG Impression:  Significant ST abnormalities consistent with ischemia. Comparison with Prior Nuclear Study: No images to compare  Overall Impression:  Abnormal stress nuclear study with a medium, severe inferobasal, fixed defect consistent with previous infarct; no ischemia.  LV Ejection Fraction: 54%.  LV Wall Motion:  NL LV Function; NL Wall Motion  Olga Millers

## 2011-05-28 ENCOUNTER — Telehealth: Payer: Self-pay | Admitting: *Deleted

## 2011-05-28 NOTE — Telephone Encounter (Signed)
Advised patient of results.  

## 2011-05-28 NOTE — Telephone Encounter (Signed)
Message copied by Royanne Foots on Wed May 28, 2011  1:58 PM ------      Message from: Rosalio Macadamia      Created: Wed May 28, 2011  1:17 PM       Can you call Mr. Decuir and let him know his stress test was ok and that Dr. Antoine Poche will see him next month. He should call us if he has any problems.            Lawson Fiscal      ----- Message -----         From: Domenic Polite         Sent: 05/28/2011   9:09 AM           To: Rosalio Macadamia, NP

## 2011-06-04 ENCOUNTER — Telehealth: Payer: Self-pay | Admitting: Cardiology

## 2011-06-04 NOTE — Telephone Encounter (Signed)
Pt aware of results 

## 2011-06-04 NOTE — Telephone Encounter (Signed)
New problem:  Patient calling, stating that someone call him, was told to  the office .

## 2011-06-24 ENCOUNTER — Encounter: Payer: Self-pay | Admitting: Cardiology

## 2012-11-08 IMAGING — CR DG ELBOW COMPLETE 3+V*R*
5 series · 5 of 5 positions shown · non-contrast
Comparison: None.

CLINICAL DATA: Pain post fall

RIGHT ELBOW - COMPLETE 3+ VIEW

[x elbow joint lat right (1 of 2)]
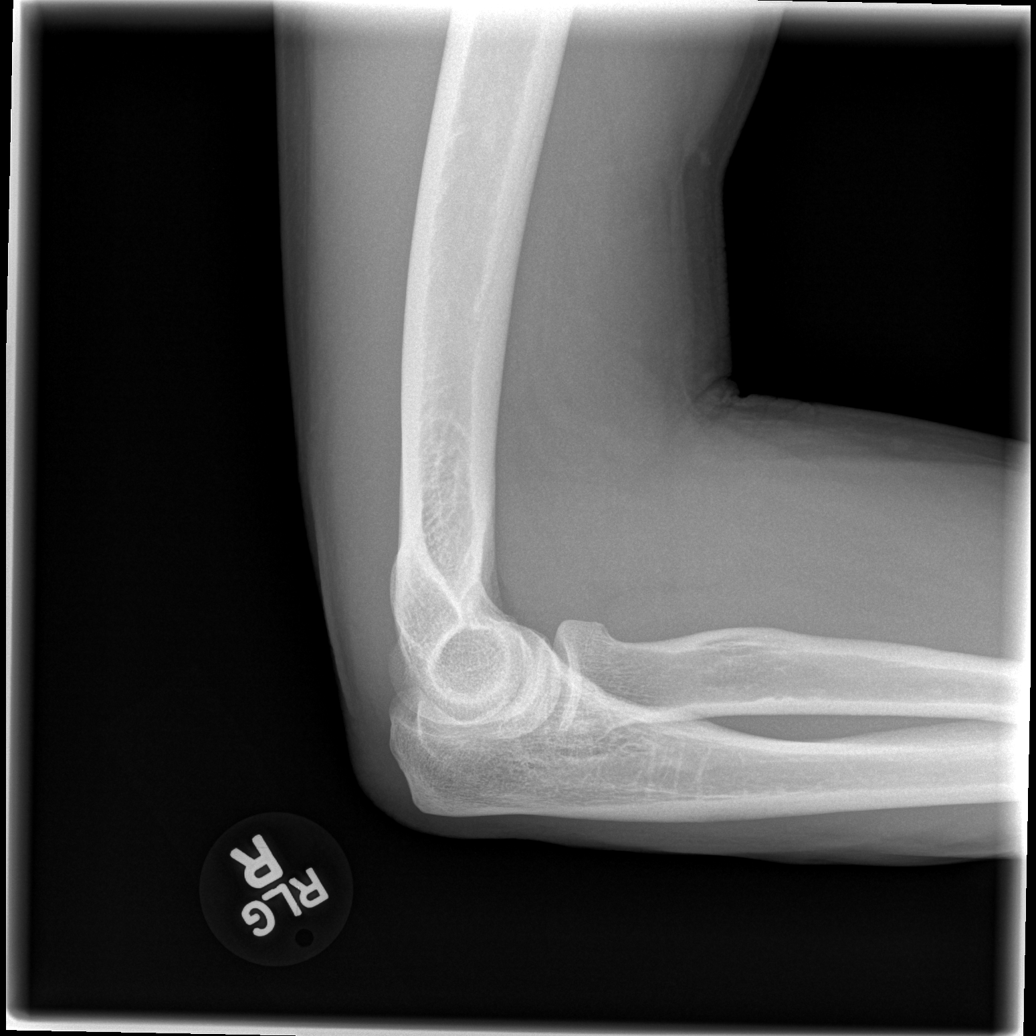

[x elbow joint lat right (2 of 2)]
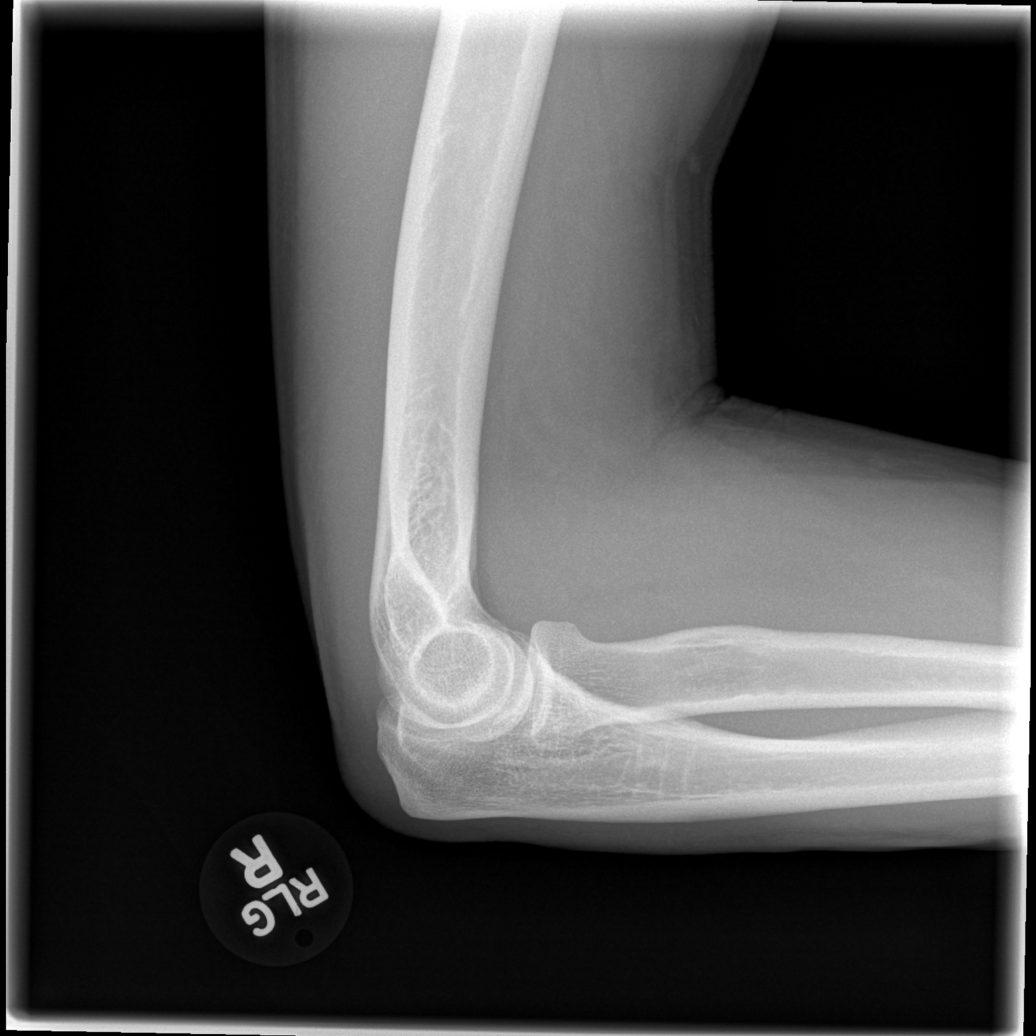

[x elbow joint ap right]
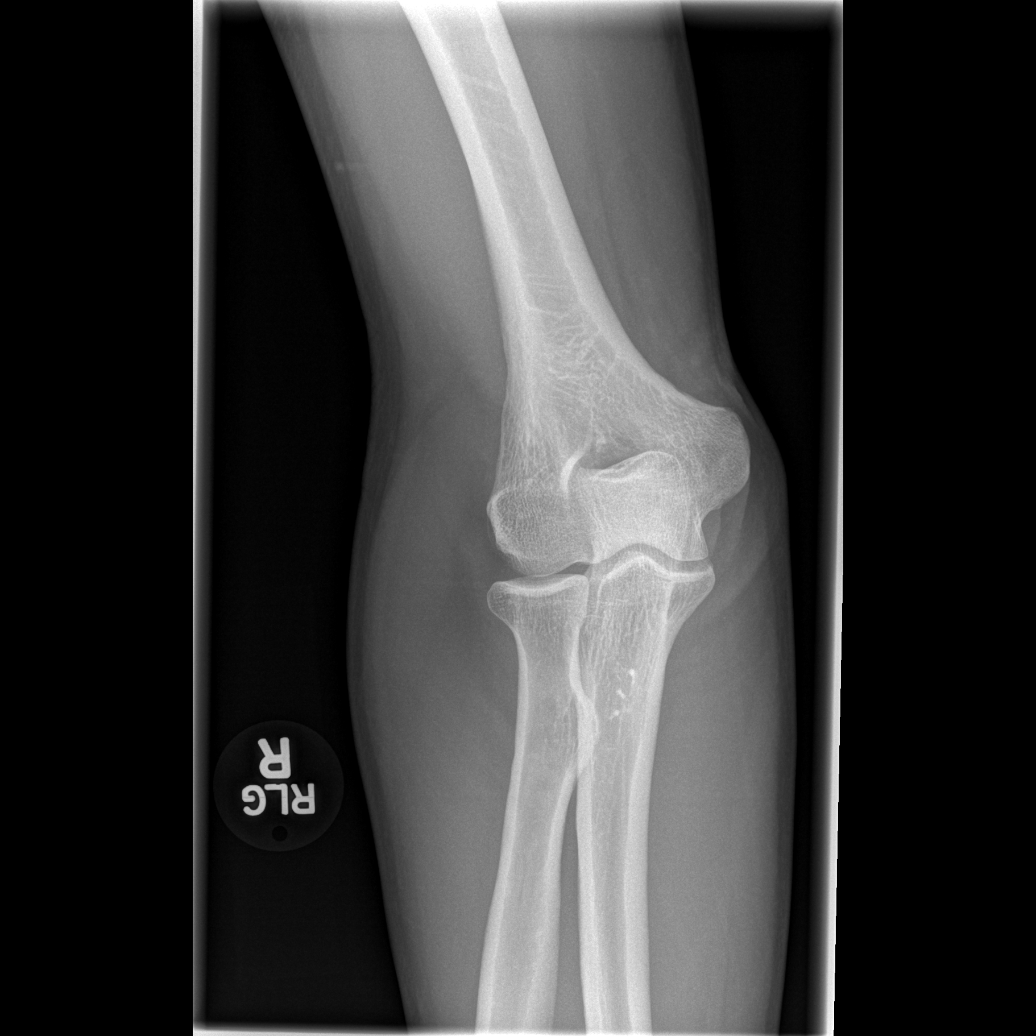

[x elbow joint obl. right (1 of 2)]
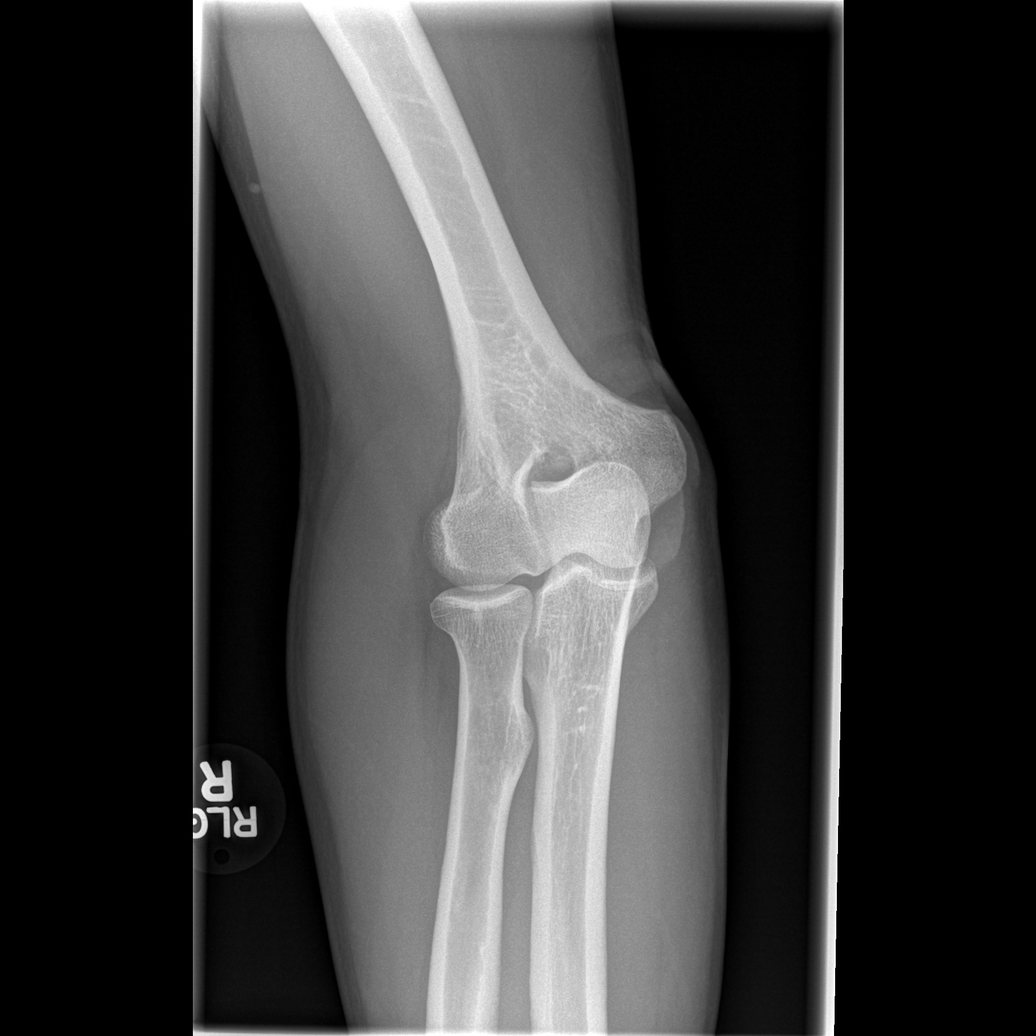

[x elbow joint obl. right (2 of 2)]
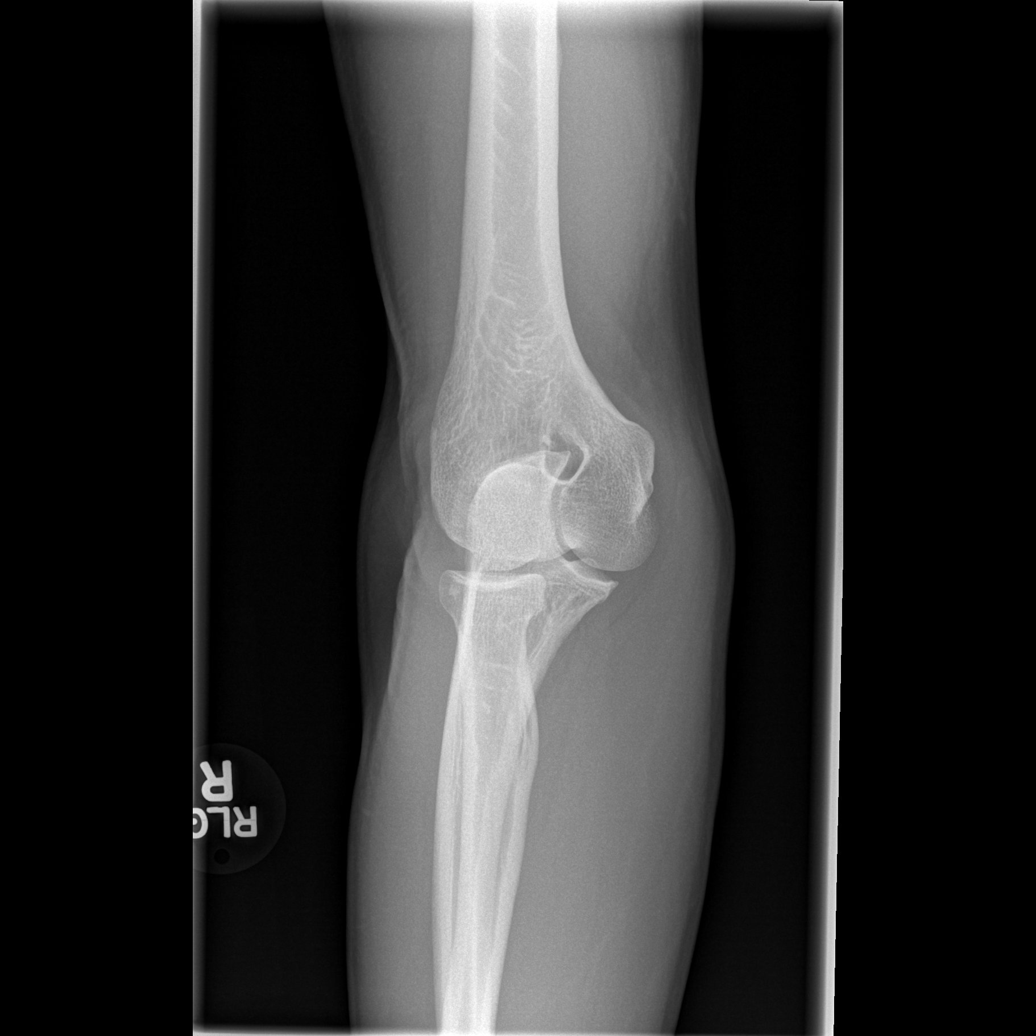

[5 of 5 positions shown; findings below may reference images not displayed]

FINDINGS: No effusion. Negative for fracture, dislocation, or other
acute abnormality.  Normal alignment and mineralization. No
significant degenerative change.  Regional soft tissues
unremarkable.
IMPRESSION: Negative

## 2013-02-03 IMAGING — CR DG CHEST 1V PORT
1 series · 1 of 1 positions shown · non-contrast
Comparison: 09/15/2010

CLINICAL DATA: Chest pain.  Previous myocardial infarct.

CHEST - 1 VIEW

[AP]
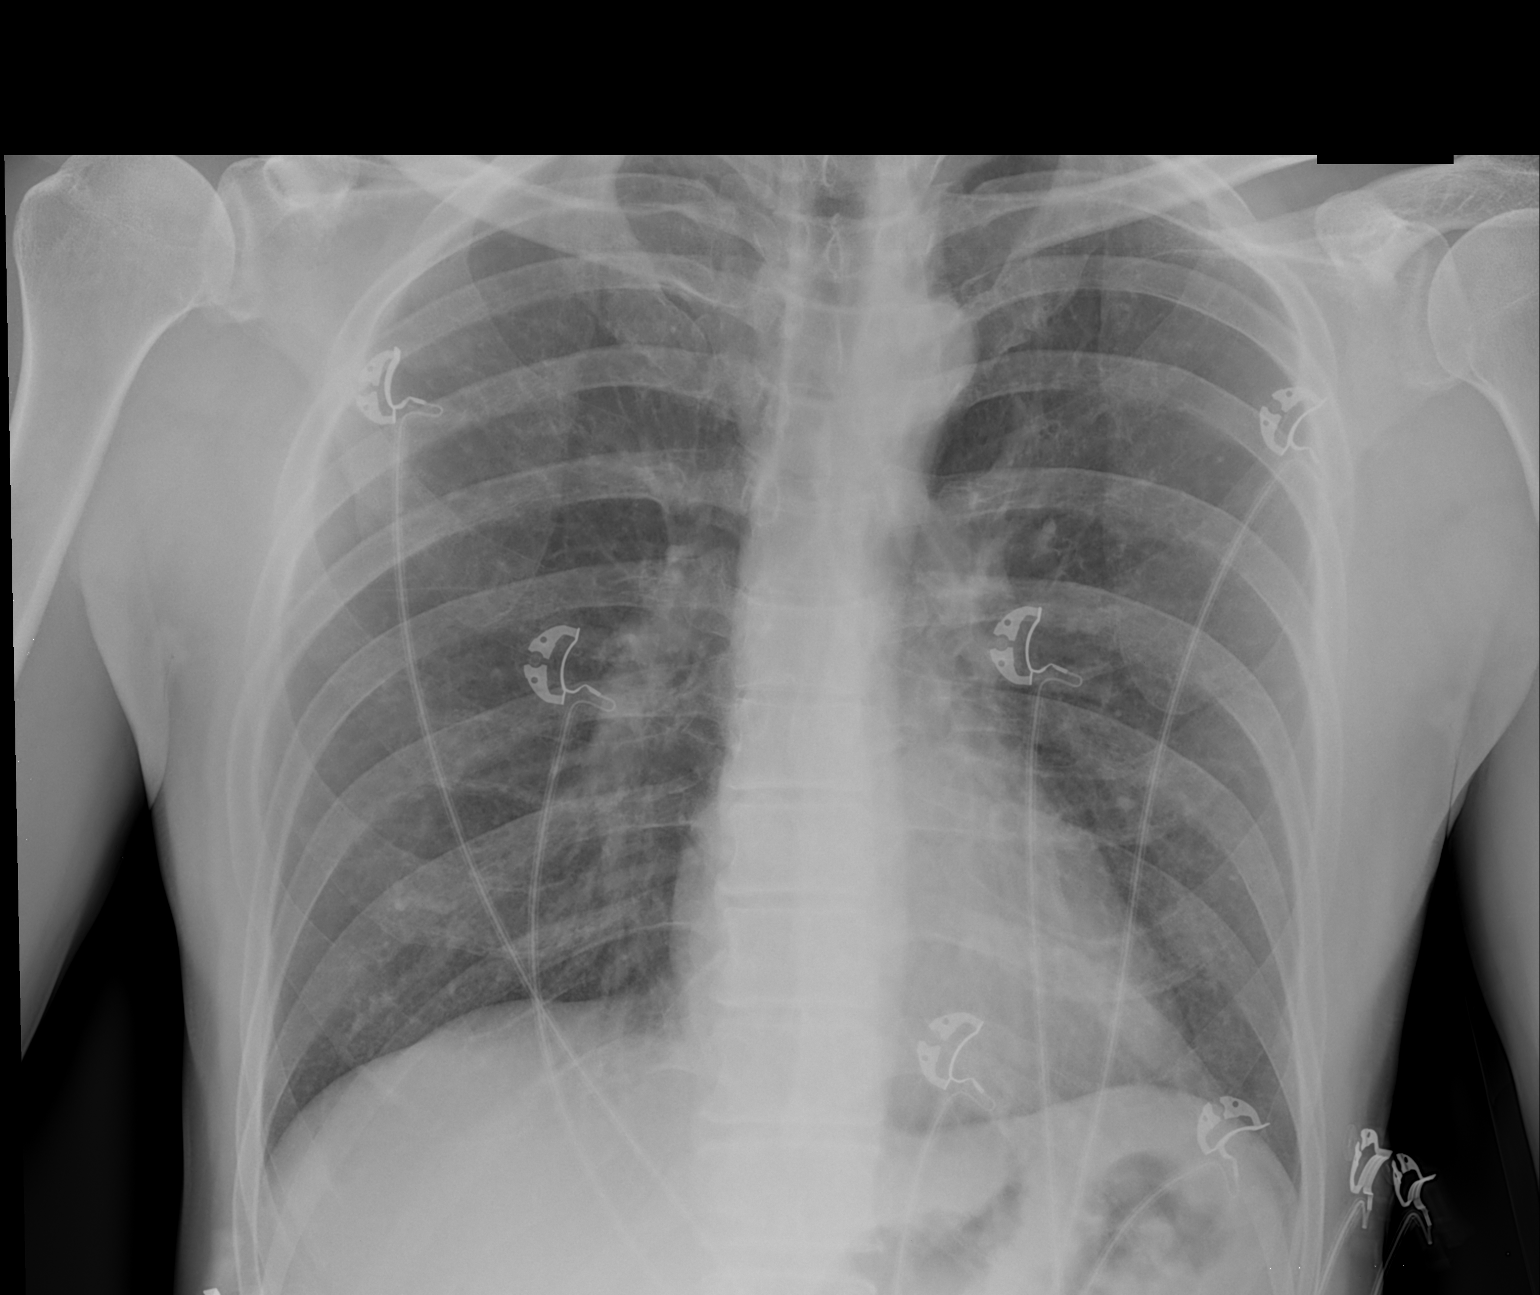

[1 of 1 positions shown; findings below may reference images not displayed]

FINDINGS: The heart size and mediastinal contours are within normal
limits.  Both lungs are clear.
IMPRESSION: No active disease.

## 2013-04-16 IMAGING — CR DG CHEST 2V
2 series · 2 of 2 positions shown · non-contrast
Comparison: 12/19/2010

CLINICAL DATA: Fever and shortness of breath

CHEST - 2 VIEW

[w chest pa]
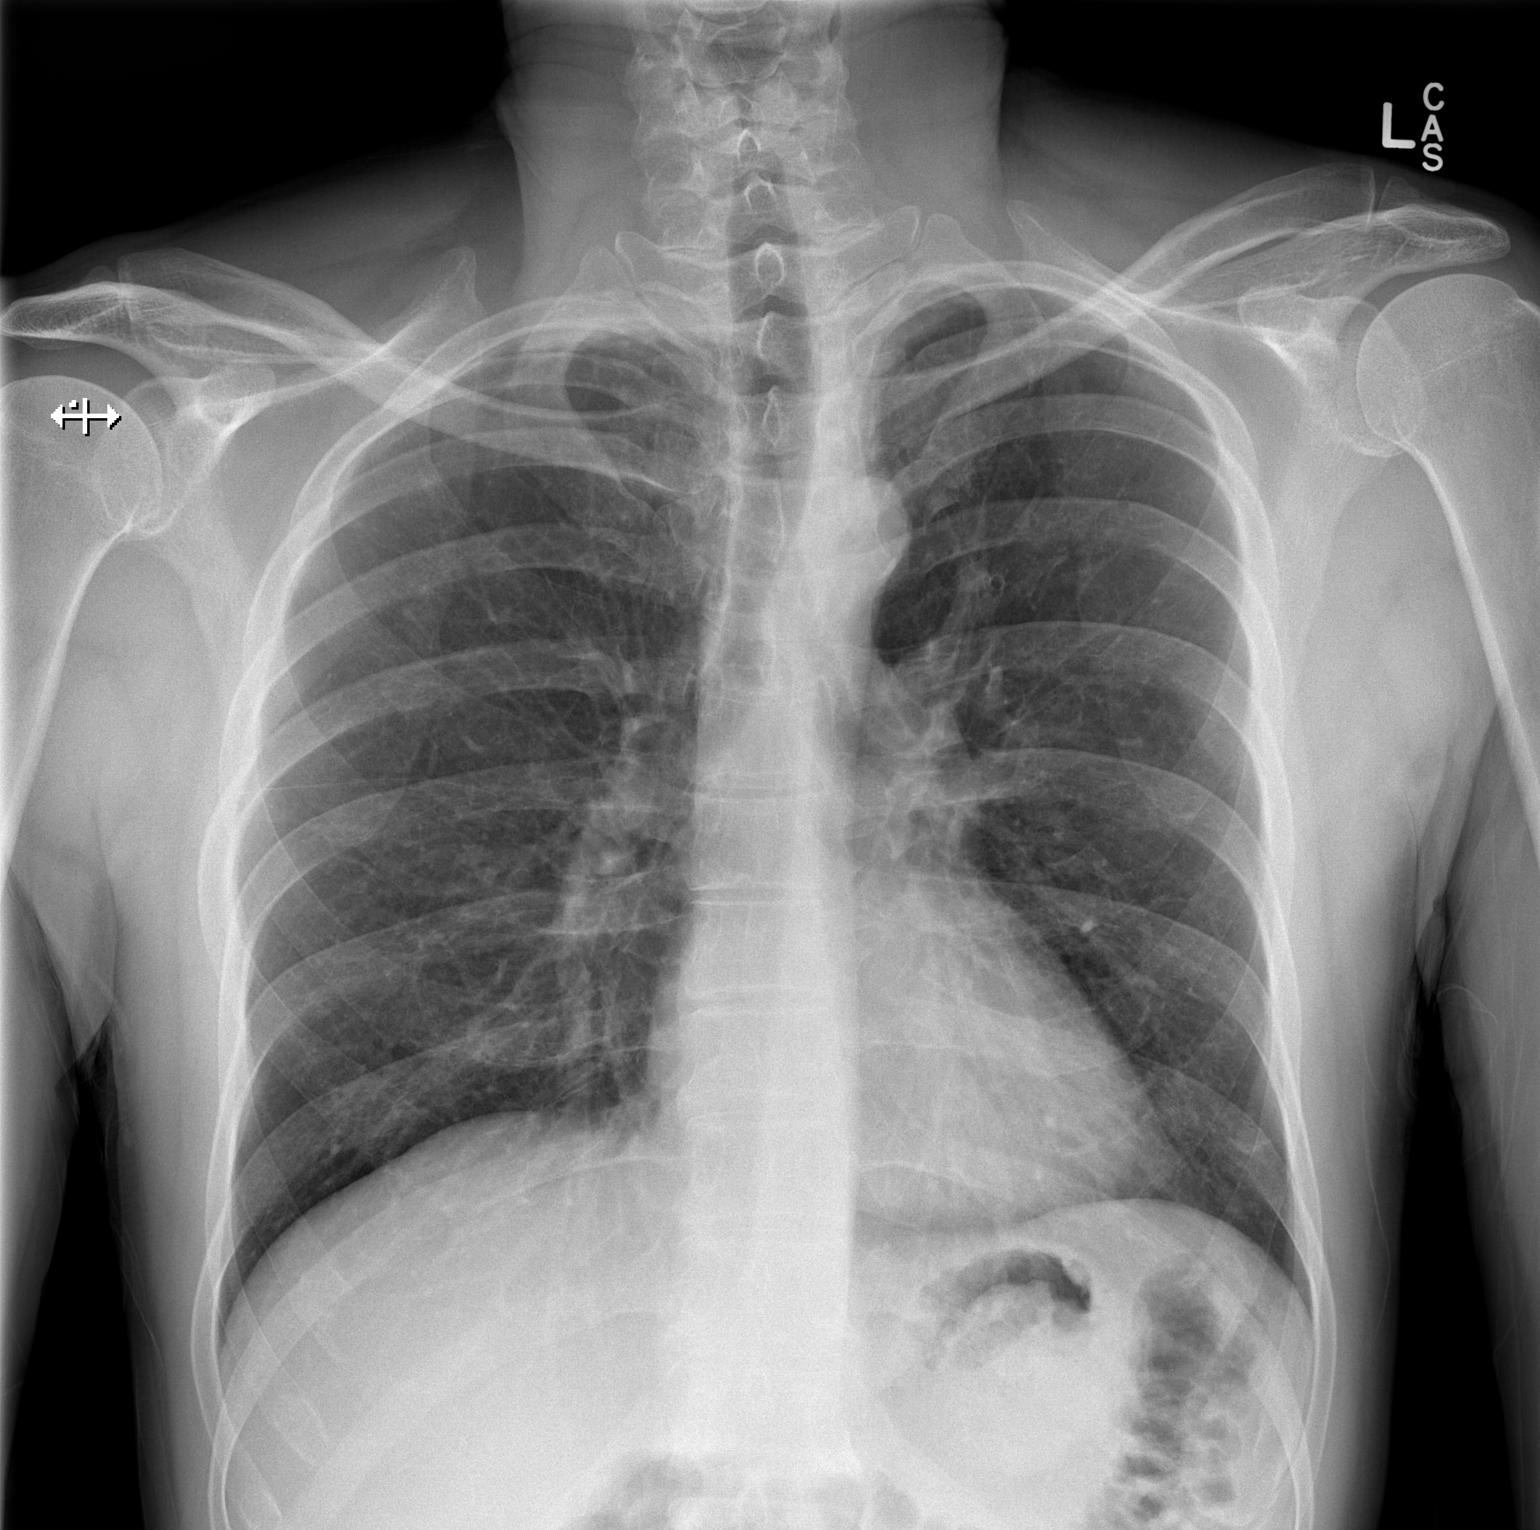

[w chest lat]
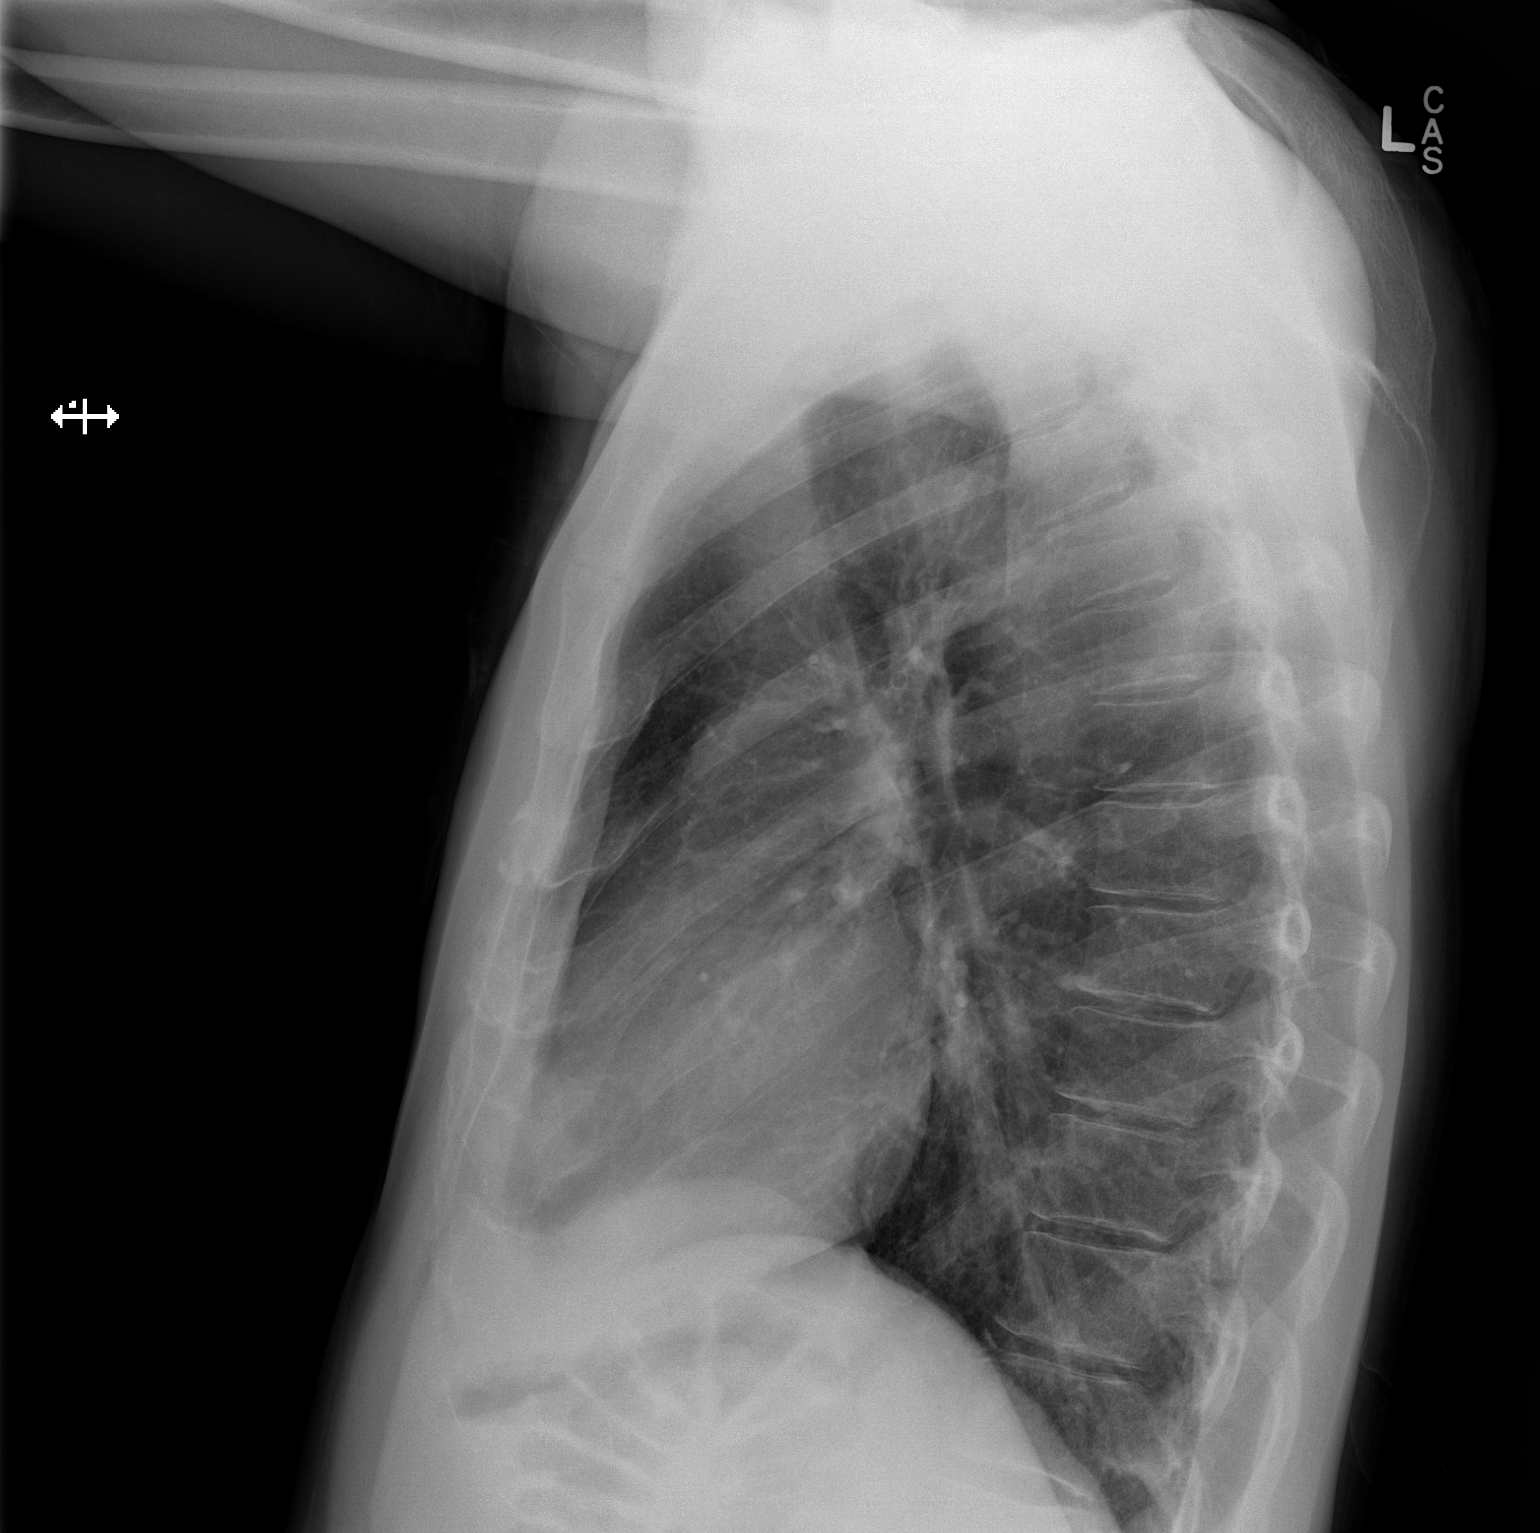

[2 of 2 positions shown; findings below may reference images not displayed]

FINDINGS: Normal heart size and pulmonary vascularity.  Mild
hyperinflation and scattered fibrosis in the lungs.  No focal
airspace consolidation.  No blunting of costophrenic angles.  No
pneumothorax.  No significant change since previous study.
IMPRESSION: No evidence of active pulmonary disease.

## 2013-06-23 IMAGING — CR DG CHEST 2V
2 series · 2 of 2 positions shown · non-contrast
Comparison: 03/01/2011.

CLINICAL DATA: Chest pain and shortness of breath for 1 day.
History of coronary stent.

CHEST - 2 VIEW

[w chest pa]
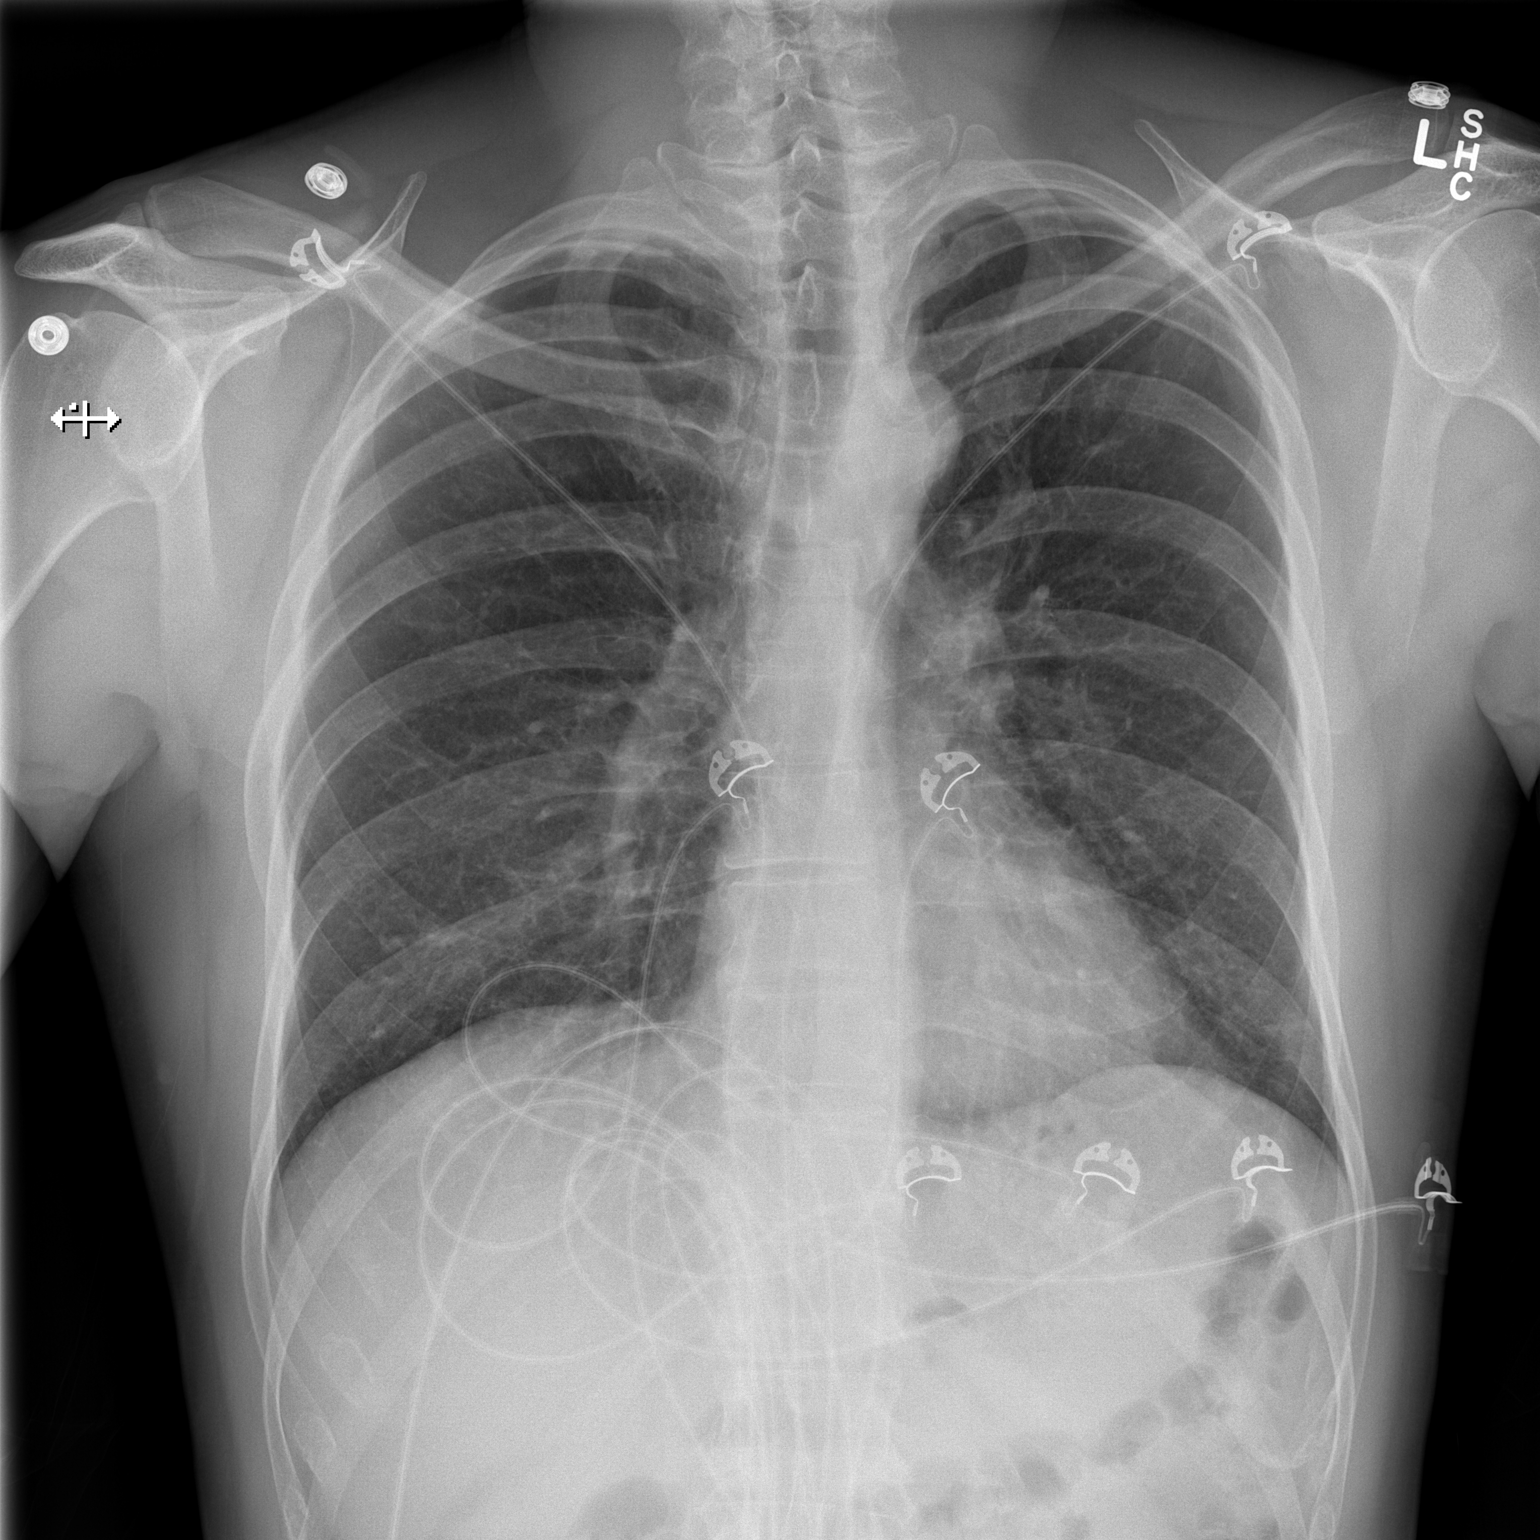

[w chest lat]
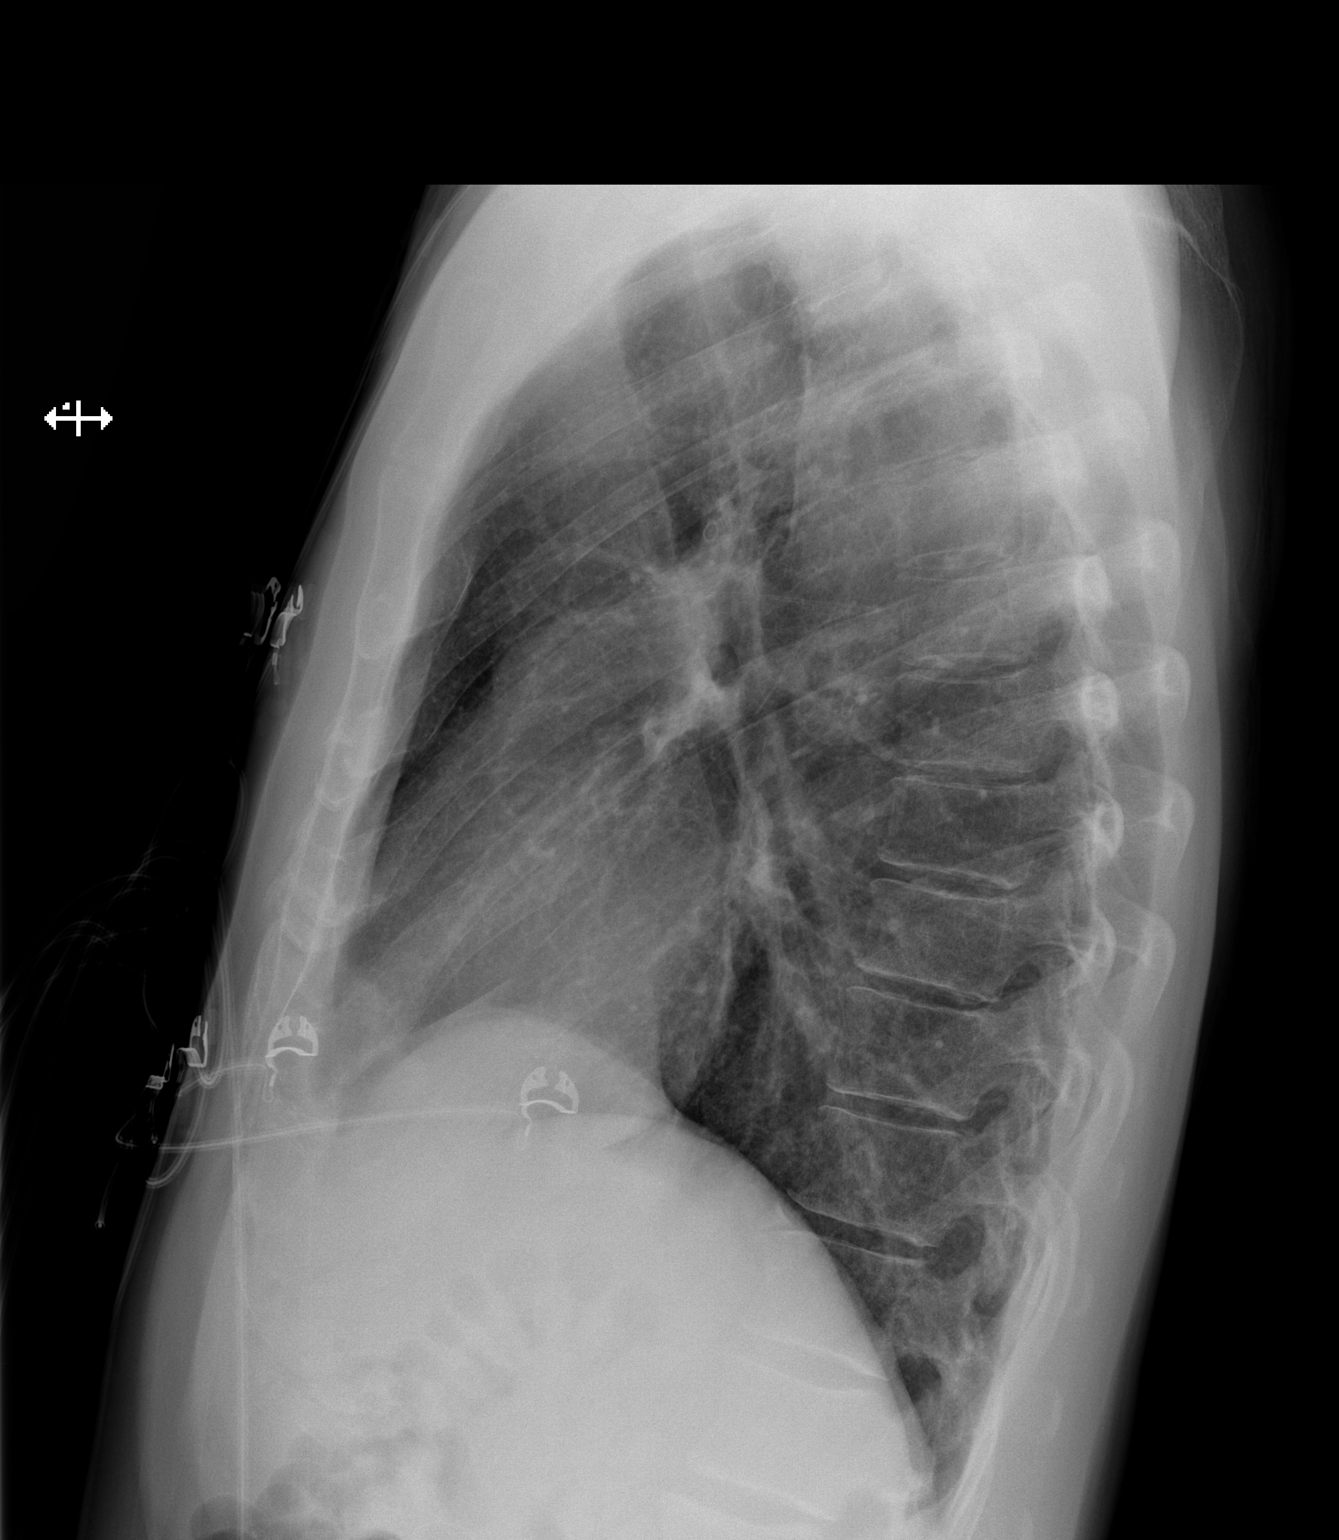

[2 of 2 positions shown; findings below may reference images not displayed]

FINDINGS: The heart size and mediastinal contours are normal. The
lungs are clear. There is no pleural effusion or pneumothorax. No
acute osseous findings are identified.  Telemetry leads overlie the
chest.
IMPRESSION: Stable mild chronic lung disease.  No acute cardiopulmonary
process.

## 2013-12-01 ENCOUNTER — Encounter: Payer: Self-pay | Admitting: Cardiology

## 2017-10-12 ENCOUNTER — Other Ambulatory Visit: Payer: Self-pay | Admitting: Orthopaedic Surgery

## 2017-10-12 DIAGNOSIS — M25561 Pain in right knee: Secondary | ICD-10-CM

## 2017-10-14 ENCOUNTER — Other Ambulatory Visit: Payer: Self-pay

## 2017-10-18 ENCOUNTER — Ambulatory Visit
Admission: RE | Admit: 2017-10-18 | Discharge: 2017-10-18 | Disposition: A | Discharge status: Home / Self Care | Payer: Self-pay | Source: Ambulatory Visit | Attending: Orthopaedic Surgery | Admitting: Orthopaedic Surgery

## 2017-10-18 DIAGNOSIS — M25561 Pain in right knee: Secondary | ICD-10-CM

## 2019-12-04 IMAGING — MR MR KNEE*R* W/O CM
7 series · 38 of 40 positions shown · non-contrast
Comparison: None.

CLINICAL DATA: Knee injury 1 year ago. Arthroscopic repair June 2017. Evaluate for meniscal tear.

EXAM:
MRI OF THE RIGHT KNEE WITHOUT CONTRAST
TECHNIQUE: Multiplanar, multisequence MR imaging of the knee was performed. No
intravenous contrast was administered.

[Series 6: T2 fat-sat · axial · right · 4.0mm · 0.50mm/px · z∈[-59,+95]mm · 7 of 36 slices shown (1 of 3)]
[im 1/36]
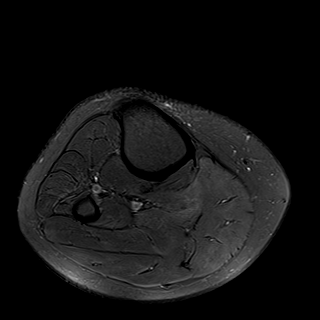
[im 6/36]
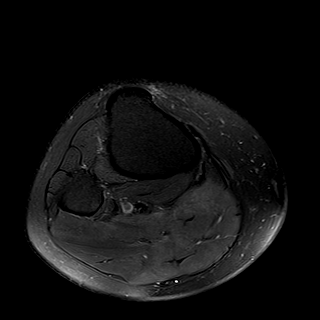
[im 12/36]
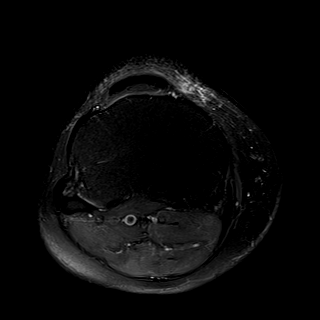
[im 18/36]
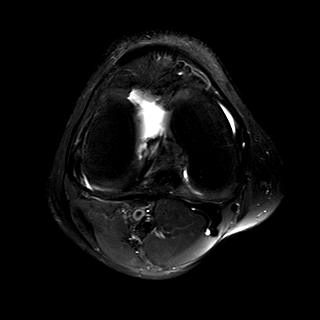
[im 24/36]
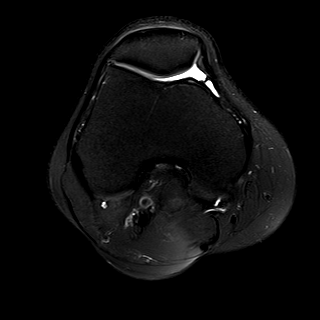
[im 30/36]
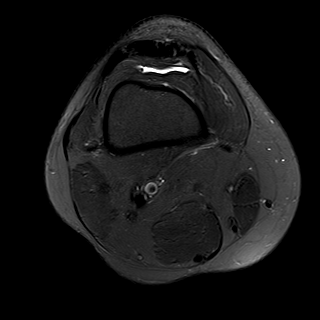
[im 36/36]
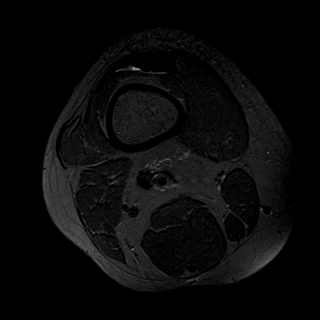

[Series 7: T2 fat-sat · coronal · right · 4.0mm · 0.39mm/px · 5 of 28 slices shown (2 of 3)]
[im 1/28]
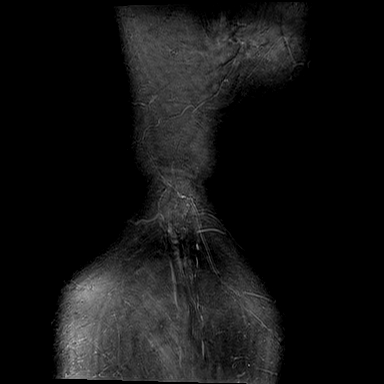
[im 7/28]
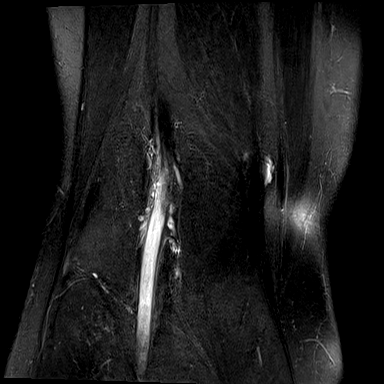
[im 14/28]
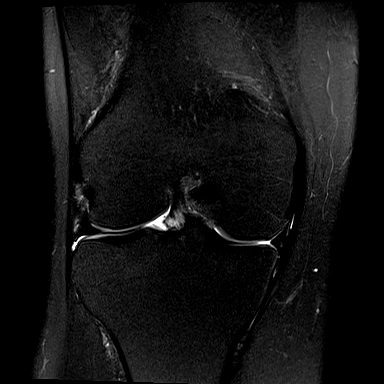
[im 21/28]
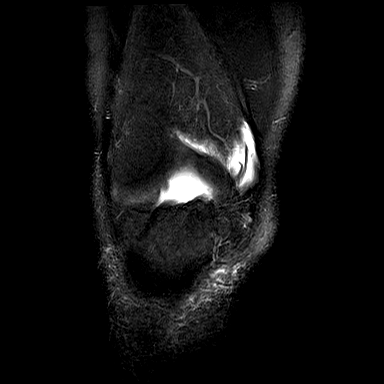
[im 28/28]
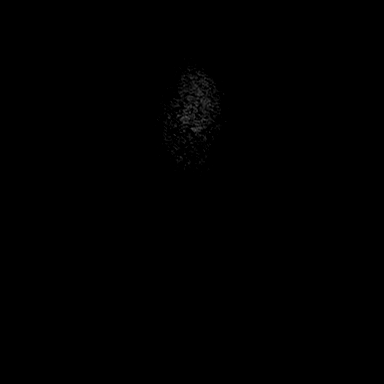

[Series 8: T1 · coronal · right · 4.0mm · 0.39mm/px · 3 of 28 slices shown]
[im 1/28]
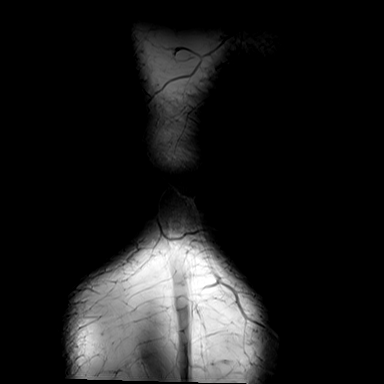
[im 7/28]
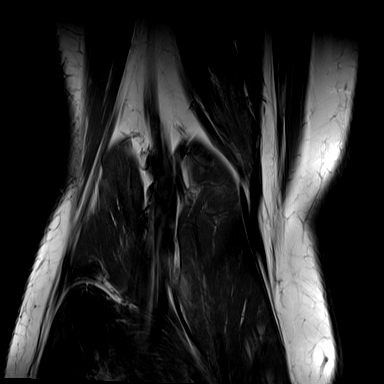
[im 14/28]
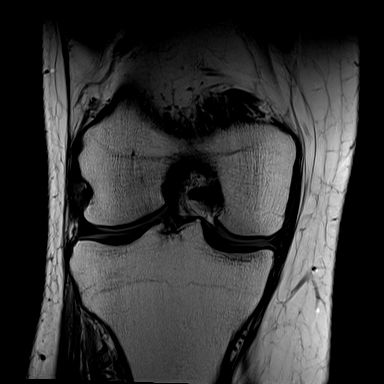

[Series 9: PD fat-sat · coronal · right · 3.0mm · 0.47mm/px · 7 of 34 slices shown (1 of 2)]
[im 1/34]
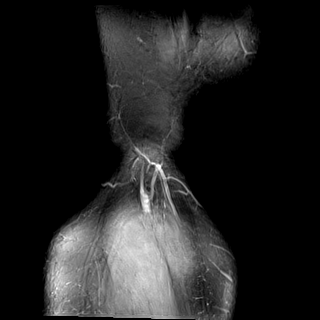
[im 6/34]
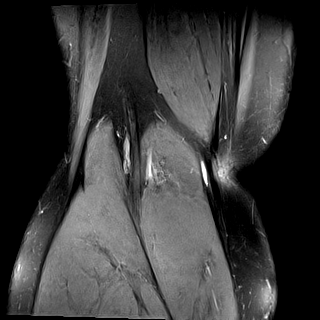
[im 12/34]
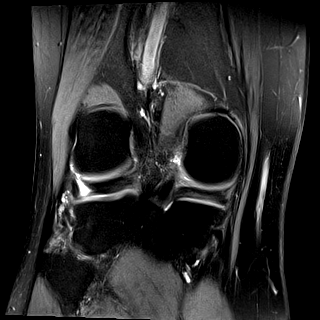
[im 17/34]
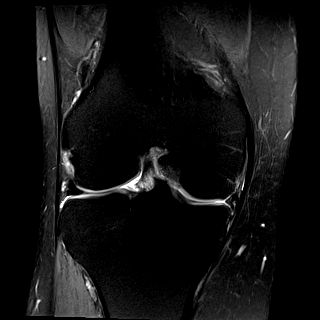
[im 23/34]
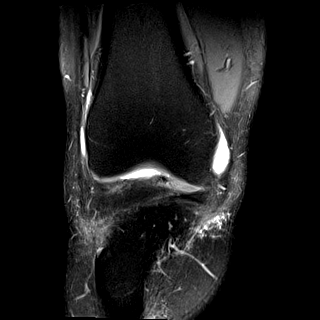
[im 28/34]
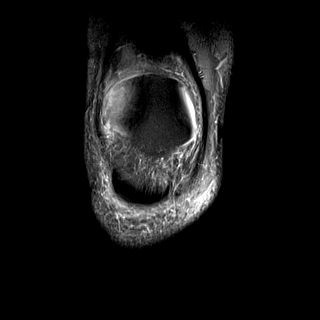
[im 34/34]
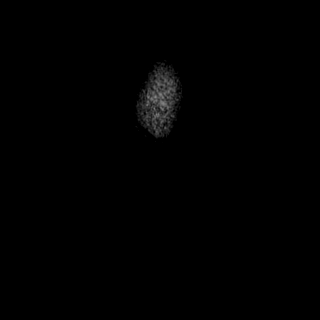

[Series 10: PD fat-sat · sagittal · right · 3.0mm · 0.39mm/px · 6 of 30 slices shown (2 of 2)]
[im 1/30]
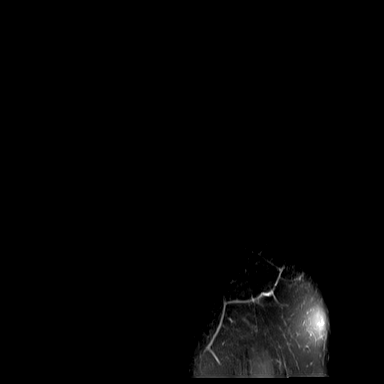
[im 6/30]
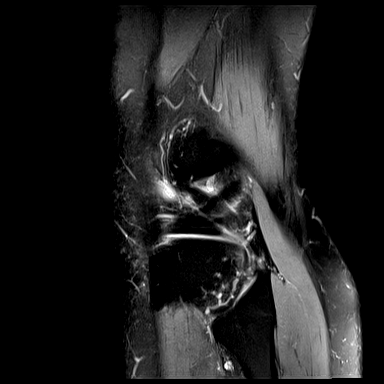
[im 12/30]
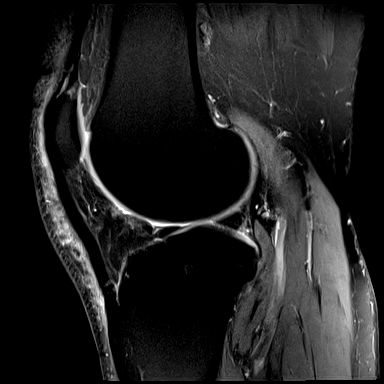
[im 18/30]
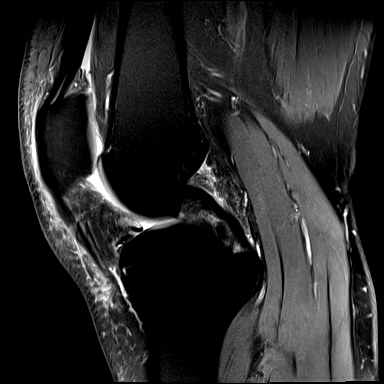
[im 24/30]
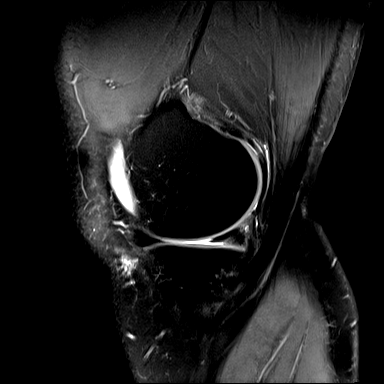
[im 30/30]
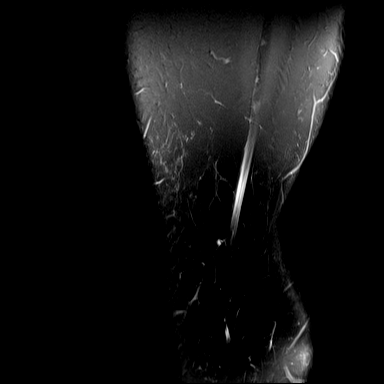

[Series 11: T2 fat-sat · sagittal · right · 3.0mm · 0.39mm/px · 6 of 30 slices shown (3 of 3)]
[im 1/30]
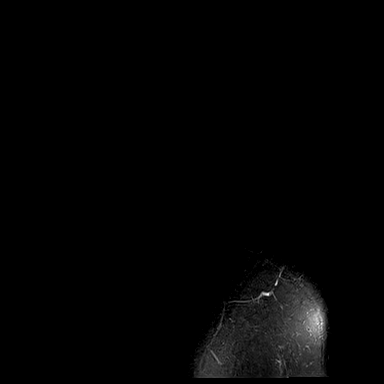
[im 6/30]
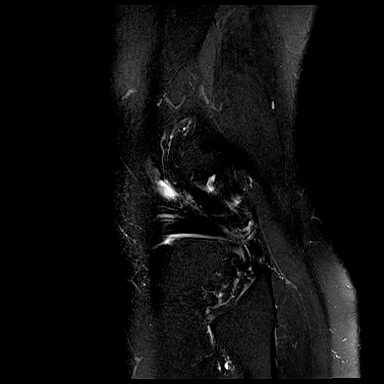
[im 12/30]
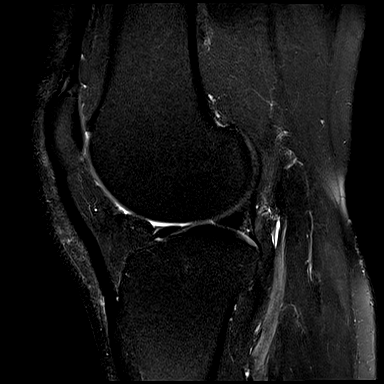
[im 18/30]
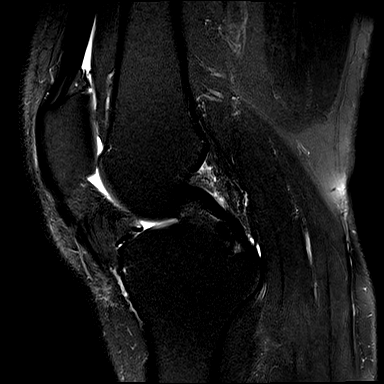
[im 24/30]
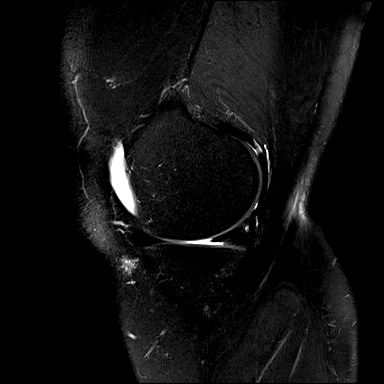
[im 30/30]
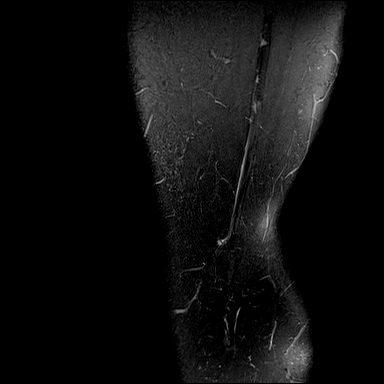

[Series 12: PD · coronal · right · 1.5mm · 0.44mm/px · 4 of 21 slices shown]
[im 1/21]
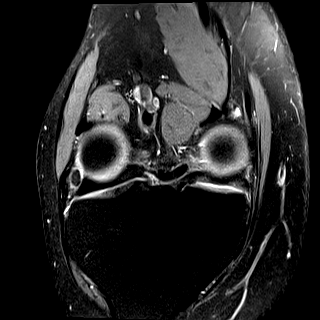
[im 7/21]
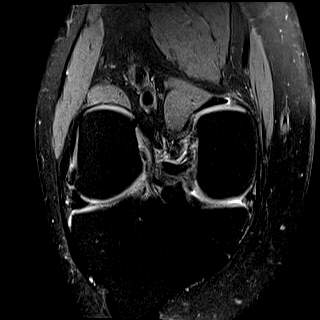
[im 14/21]
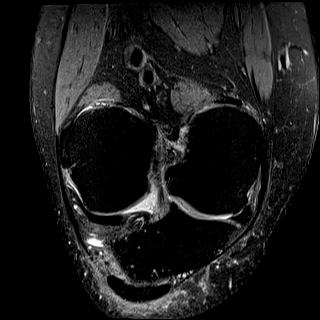
[im 21/21]
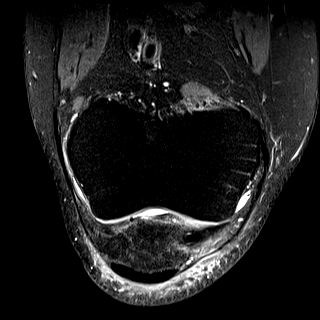

[38 of 40 positions shown; findings below may reference images not displayed]

FINDINGS: MENISCI

Medial meniscus: There is free edge truncation of the meniscal body
on the coronal images which may be postoperative in nature. Oblique
signal in the posterior horn extends to the inferior articular
surface, best seen on images 8 and 9 of series 10, and is associated
with a possible tiny parameniscal cyst posteriorly (image [DATE]). The
meniscal root is intact. No displaced meniscal fragment.

Lateral meniscus:  Intact with normal morphology.

LIGAMENTS

Cruciates:  Intact.

Collaterals:  Intact.

CARTILAGE

Patellofemoral:  Preserved.

Medial: Mild chondral thinning and surface irregularity without
full-thickness defect or subchondral signal abnormality.

Lateral:  Preserved.

MISCELLANEOUS

Joint:  No significant joint effusion.

Popliteal Fossa:  Unremarkable. No significant Baker's cyst.

Extensor Mechanism:  Intact.

Bones:  No acute or significant extra-articular osseous findings.

Other: Mild postsurgical scarring in Hoffa's fat.
IMPRESSION: 1. Findings are suspicious for an oblique tear of the posterior horn
of the medial meniscus. Given prior knee surgery and presumed
partial meniscectomy, the specificity of the findings is decreased.
No centrally displaced meniscal fragment.
2. The lateral meniscus, cruciate and collateral ligaments are
intact.
3. Mild scarring in Hoffa's fat.
4. Minimal medial compartment degenerative changes. No acute osseous
findings.

## 2020-04-22 ENCOUNTER — Encounter (HOSPITAL_COMMUNITY): Payer: Self-pay | Admitting: Cardiovascular Disease

## 2020-04-22 ENCOUNTER — Other Ambulatory Visit (HOSPITAL_COMMUNITY): Payer: Self-pay

## 2020-04-22 ENCOUNTER — Inpatient Hospital Stay (HOSPITAL_COMMUNITY)
Admission: EM | Admit: 2020-04-22 | Discharge: 2020-04-25 | DRG: 246 | Disposition: A | Discharge status: Home / Self Care | Payer: BC Managed Care – PPO | Attending: Cardiovascular Disease | Admitting: Cardiovascular Disease

## 2020-04-22 ENCOUNTER — Encounter (HOSPITAL_COMMUNITY)
Admission: EM | Disposition: A | Discharge status: Home / Self Care | Payer: Self-pay | Source: Home / Self Care | Attending: Cardiovascular Disease

## 2020-04-22 ENCOUNTER — Other Ambulatory Visit: Payer: Self-pay

## 2020-04-22 DIAGNOSIS — I2109 ST elevation (STEMI) myocardial infarction involving other coronary artery of anterior wall: Principal | ICD-10-CM | POA: Diagnosis present

## 2020-04-22 DIAGNOSIS — Z720 Tobacco use: Secondary | ICD-10-CM

## 2020-04-22 DIAGNOSIS — E7800 Pure hypercholesterolemia, unspecified: Secondary | ICD-10-CM | POA: Diagnosis present

## 2020-04-22 DIAGNOSIS — I255 Ischemic cardiomyopathy: Secondary | ICD-10-CM

## 2020-04-22 DIAGNOSIS — Z20822 Contact with and (suspected) exposure to covid-19: Secondary | ICD-10-CM | POA: Diagnosis present

## 2020-04-22 DIAGNOSIS — Z8249 Family history of ischemic heart disease and other diseases of the circulatory system: Secondary | ICD-10-CM

## 2020-04-22 DIAGNOSIS — R079 Chest pain, unspecified: Secondary | ICD-10-CM

## 2020-04-22 DIAGNOSIS — Z7982 Long term (current) use of aspirin: Secondary | ICD-10-CM

## 2020-04-22 DIAGNOSIS — Z87891 Personal history of nicotine dependence: Secondary | ICD-10-CM

## 2020-04-22 DIAGNOSIS — I213 ST elevation (STEMI) myocardial infarction of unspecified site: Secondary | ICD-10-CM | POA: Diagnosis present

## 2020-04-22 DIAGNOSIS — E785 Hyperlipidemia, unspecified: Secondary | ICD-10-CM | POA: Diagnosis present

## 2020-04-22 DIAGNOSIS — Z79899 Other long term (current) drug therapy: Secondary | ICD-10-CM | POA: Diagnosis not present

## 2020-04-22 DIAGNOSIS — I5021 Acute systolic (congestive) heart failure: Secondary | ICD-10-CM | POA: Diagnosis present

## 2020-04-22 DIAGNOSIS — I2510 Atherosclerotic heart disease of native coronary artery without angina pectoris: Secondary | ICD-10-CM | POA: Diagnosis present

## 2020-04-22 DIAGNOSIS — I472 Ventricular tachycardia: Secondary | ICD-10-CM | POA: Diagnosis present

## 2020-04-22 DIAGNOSIS — I2102 ST elevation (STEMI) myocardial infarction involving left anterior descending coronary artery: Secondary | ICD-10-CM | POA: Diagnosis present

## 2020-04-22 DIAGNOSIS — Z955 Presence of coronary angioplasty implant and graft: Secondary | ICD-10-CM

## 2020-04-22 HISTORY — PX: LEFT HEART CATH AND CORONARY ANGIOGRAPHY: CATH118249

## 2020-04-22 HISTORY — PX: CORONARY/GRAFT ACUTE MI REVASCULARIZATION: CATH118305

## 2020-04-22 LAB — COMPREHENSIVE METABOLIC PANEL
ALT: 27 U/L (ref 0–44)
AST: 66 U/L — ABNORMAL HIGH (ref 15–41)
Albumin: 3.8 g/dL (ref 3.5–5.0)
Alkaline Phosphatase: 55 U/L (ref 38–126)
Anion gap: 10 (ref 5–15)
BUN: 11 mg/dL (ref 6–20)
CO2: 20 mmol/L — ABNORMAL LOW (ref 22–32)
Calcium: 8.9 mg/dL (ref 8.9–10.3)
Chloride: 104 mmol/L (ref 98–111)
Creatinine, Ser: 0.75 mg/dL (ref 0.61–1.24)
GFR, Estimated: >60 mL/min (ref >60)
Glucose, Bld: 126 mg/dL — ABNORMAL HIGH (ref 70–99)
Potassium: 4.3 mmol/L (ref 3.5–5.1)
Sodium: 134 mmol/L — ABNORMAL LOW (ref 135–145)
Total Bilirubin: 1.2 mg/dL (ref 0.3–1.2)
Total Protein: 6.3 g/dL — ABNORMAL LOW (ref 6.5–8.1)

## 2020-04-22 LAB — CBC WITH DIFFERENTIAL/PLATELET
Abs Immature Granulocytes: 0.06 10*3/uL (ref 0.00–0.07)
Basophils Absolute: 0.1 10*3/uL (ref 0.0–0.1)
Basophils Relative: 0 %
Eosinophils Absolute: 0 10*3/uL (ref 0.0–0.5)
Eosinophils Relative: 0 %
HCT: 40.3 % (ref 39.0–52.0)
Hemoglobin: 13.4 g/dL (ref 13.0–17.0)
Immature Granulocytes: 0 %
Lymphocytes Relative: 9 %
Lymphs Abs: 1.3 10*3/uL (ref 0.7–4.0)
MCH: 28.8 pg (ref 26.0–34.0)
MCHC: 33.3 g/dL (ref 30.0–36.0)
MCV: 86.5 fL (ref 80.0–100.0)
Monocytes Absolute: 0.8 10*3/uL (ref 0.1–1.0)
Monocytes Relative: 6 %
Neutro Abs: 12.8 10*3/uL — ABNORMAL HIGH (ref 1.7–7.7)
Neutrophils Relative %: 85 %
Platelets: 297 10*3/uL (ref 150–400)
RBC: 4.66 MIL/uL (ref 4.22–5.81)
RDW: 13.2 % (ref 11.5–15.5)
WBC: 15 10*3/uL — ABNORMAL HIGH (ref 4.0–10.5)
nRBC: 0 % (ref 0.0–0.2)

## 2020-04-22 LAB — TROPONIN I (HIGH SENSITIVITY)
Troponin I (High Sensitivity): 5228 ng/L (ref <18)
Troponin I (High Sensitivity): >27000 ng/L (ref <18)
Troponin I (High Sensitivity): >27000 ng/L (ref <18)

## 2020-04-22 LAB — CBC
HCT: 34.6 % — ABNORMAL LOW (ref 39.0–52.0)
Hemoglobin: 11.8 g/dL — ABNORMAL LOW (ref 13.0–17.0)
MCH: 29.2 pg (ref 26.0–34.0)
MCHC: 34.1 g/dL (ref 30.0–36.0)
MCV: 85.6 fL (ref 80.0–100.0)
Platelets: 247 10*3/uL (ref 150–400)
RBC: 4.04 MIL/uL — ABNORMAL LOW (ref 4.22–5.81)
RDW: 13.1 % (ref 11.5–15.5)
WBC: 11.5 10*3/uL — ABNORMAL HIGH (ref 4.0–10.5)
nRBC: 0 % (ref 0.0–0.2)

## 2020-04-22 LAB — CREATININE, SERUM
Creatinine, Ser: 0.68 mg/dL (ref 0.61–1.24)
GFR, Estimated: >60 mL/min (ref >60)

## 2020-04-22 LAB — MRSA PCR SCREENING: MRSA by PCR: NEGATIVE

## 2020-04-22 LAB — SARS CORONAVIRUS 2 (TAT 6-24 HRS): SARS Coronavirus 2: NEGATIVE

## 2020-04-22 LAB — LIPASE, BLOOD: Lipase: 33 U/L (ref 11–51)

## 2020-04-22 SURGERY — CORONARY/GRAFT ACUTE MI REVASCULARIZATION
Anesthesia: LOCAL

## 2020-04-22 MED ORDER — LIDOCAINE HCL (PF) 1 % IJ SOLN
INTRAMUSCULAR | Status: AC
  Filled 2020-04-22: qty 30

## 2020-04-22 MED ORDER — HYDRALAZINE HCL 20 MG/ML IJ SOLN: 10.0000 mg | INTRAMUSCULAR | Status: AC | PRN

## 2020-04-22 MED ORDER — ACETAMINOPHEN 325 MG PO TABS: 650.0000 mg | ORAL_TABLET | ORAL | Status: DC | PRN

## 2020-04-22 MED ORDER — FENTANYL CITRATE (PF) 100 MCG/2ML IJ SOLN
INTRAMUSCULAR | Status: DC | PRN
  Administered 2020-04-22: 25 ug via INTRAVENOUS

## 2020-04-22 MED ORDER — LABETALOL HCL 5 MG/ML IV SOLN: 10.0000 mg | INTRAVENOUS | Status: AC | PRN

## 2020-04-22 MED ORDER — VERAPAMIL HCL 2.5 MG/ML IV SOLN
INTRAVENOUS | Status: DC | PRN
  Administered 2020-04-22: 10 mL via INTRA_ARTERIAL

## 2020-04-22 MED ORDER — LIDOCAINE HCL (PF) 1 % IJ SOLN
INTRAMUSCULAR | Status: DC | PRN
  Administered 2020-04-22: 2 mL

## 2020-04-22 MED ORDER — ENOXAPARIN SODIUM 40 MG/0.4ML ~~LOC~~ SOLN
40.0000 mg | SUBCUTANEOUS | Status: DC
  Administered 2020-04-23 – 2020-04-25 (×2): 40 mg via SUBCUTANEOUS
  Filled 2020-04-22 (×3): qty 0.4

## 2020-04-22 MED ORDER — SODIUM CHLORIDE 0.9% FLUSH: 3.0000 mL | INTRAVENOUS | Status: DC | PRN

## 2020-04-22 MED ORDER — TICAGRELOR 90 MG PO TABS
ORAL_TABLET | ORAL | Status: DC | PRN
  Administered 2020-04-22: 180 mg via ORAL

## 2020-04-22 MED ORDER — ASPIRIN 81 MG PO CHEW
81.0000 mg | CHEWABLE_TABLET | Freq: Every day | ORAL | Status: DC
  Administered 2020-04-22 – 2020-04-25 (×4): 81 mg via ORAL
  Filled 2020-04-22 (×4): qty 1

## 2020-04-22 MED ORDER — HEPARIN (PORCINE) IN NACL 1000-0.9 UT/500ML-% IV SOLN
INTRAVENOUS | Status: DC | PRN
  Administered 2020-04-22 (×2): 500 mL

## 2020-04-22 MED ORDER — NITROGLYCERIN 1 MG/10 ML FOR IR/CATH LAB
INTRA_ARTERIAL | Status: DC | PRN
  Administered 2020-04-22 (×2): 200 ug via INTRACORONARY

## 2020-04-22 MED ORDER — TICAGRELOR 90 MG PO TABS
ORAL_TABLET | ORAL | Status: AC
  Filled 2020-04-22: qty 1

## 2020-04-22 MED ORDER — IOHEXOL 350 MG/ML SOLN
INTRAVENOUS | Status: AC
  Filled 2020-04-22: qty 1

## 2020-04-22 MED ORDER — ONDANSETRON HCL 4 MG/2ML IJ SOLN: 4.0000 mg | Freq: Four times a day (QID) | INTRAMUSCULAR | Status: DC | PRN

## 2020-04-22 MED ORDER — DIAZEPAM 5 MG PO TABS: 5.0000 mg | ORAL_TABLET | Freq: Four times a day (QID) | ORAL | Status: DC | PRN

## 2020-04-22 MED ORDER — HEPARIN SODIUM (PORCINE) 1000 UNIT/ML IJ SOLN
INTRAMUSCULAR | Status: DC | PRN
  Administered 2020-04-22: 3000 [IU] via INTRAVENOUS
  Administered 2020-04-22: 3500 [IU] via INTRAVENOUS

## 2020-04-22 MED ORDER — SODIUM CHLORIDE 0.9 % IV SOLN: 250.0000 mL | INTRAVENOUS | Status: DC | PRN

## 2020-04-22 MED ORDER — TICAGRELOR 90 MG PO TABS
90.0000 mg | ORAL_TABLET | Freq: Two times a day (BID) | ORAL | Status: DC
  Administered 2020-04-22 – 2020-04-25 (×6): 90 mg via ORAL
  Filled 2020-04-22 (×6): qty 1

## 2020-04-22 MED ORDER — IOHEXOL 350 MG/ML SOLN
INTRAVENOUS | Status: DC | PRN
  Administered 2020-04-22: 110 mL

## 2020-04-22 MED ORDER — SODIUM CHLORIDE 0.9 % IV SOLN: INTRAVENOUS | Status: DC

## 2020-04-22 MED ORDER — FENTANYL CITRATE (PF) 100 MCG/2ML IJ SOLN
INTRAMUSCULAR | Status: AC
  Filled 2020-04-22: qty 2

## 2020-04-22 MED ORDER — HEPARIN (PORCINE) IN NACL 1000-0.9 UT/500ML-% IV SOLN
INTRAVENOUS | Status: AC
  Filled 2020-04-22: qty 1500

## 2020-04-22 MED ORDER — SODIUM CHLORIDE 0.9% FLUSH
3.0000 mL | Freq: Two times a day (BID) | INTRAVENOUS | Status: DC
  Administered 2020-04-22 – 2020-04-25 (×5): 3 mL via INTRAVENOUS

## 2020-04-22 MED ORDER — MIDAZOLAM HCL 2 MG/2ML IJ SOLN
INTRAMUSCULAR | Status: DC | PRN
  Administered 2020-04-22: 2 mg via INTRAVENOUS

## 2020-04-22 MED ORDER — ATORVASTATIN CALCIUM 80 MG PO TABS
80.0000 mg | ORAL_TABLET | Freq: Every day | ORAL | Status: DC
  Administered 2020-04-22 – 2020-04-25 (×4): 80 mg via ORAL
  Filled 2020-04-22 (×4): qty 1

## 2020-04-22 MED ORDER — MIDAZOLAM HCL 2 MG/2ML IJ SOLN
INTRAMUSCULAR | Status: AC
  Filled 2020-04-22: qty 2

## 2020-04-22 MED ORDER — VERAPAMIL HCL 2.5 MG/ML IV SOLN
INTRAVENOUS | Status: AC
  Filled 2020-04-22: qty 2

## 2020-04-22 MED ORDER — CARVEDILOL 3.125 MG PO TABS
3.1250 mg | ORAL_TABLET | Freq: Two times a day (BID) | ORAL | Status: DC
  Administered 2020-04-22 – 2020-04-25 (×6): 3.125 mg via ORAL
  Filled 2020-04-22 (×6): qty 1

## 2020-04-22 MED ORDER — HEPARIN SODIUM (PORCINE) 1000 UNIT/ML IJ SOLN
INTRAMUSCULAR | Status: AC
  Filled 2020-04-22: qty 1

## 2020-04-22 SURGICAL SUPPLY — 21 items
BAG SNAP BAND KOVER 36X36 (MISCELLANEOUS) ×2 IMPLANT
BALLN SAPPHIRE 2.5X12 (BALLOONS) ×2
BALLN ~~LOC~~ EUPHORA RX 3.0X15 (BALLOONS) ×2
BALLOON SAPPHIRE 2.5X12 (BALLOONS) ×1 IMPLANT
BALLOON ~~LOC~~ EUPHORA RX 3.0X15 (BALLOONS) ×1 IMPLANT
CATH INFINITI JR4 5F (CATHETERS) ×2 IMPLANT
CATH OPTITORQUE TIG 4.0 5F (CATHETERS) ×2 IMPLANT
CATH VISTA GUIDE 6FR XBLAD3.5 (CATHETERS) ×2 IMPLANT
COVER DOME SNAP 22 D (MISCELLANEOUS) ×2 IMPLANT
DEVICE RAD COMP TR BAND LRG (VASCULAR PRODUCTS) ×2 IMPLANT
GLIDESHEATH SLEND SS 6F .021 (SHEATH) ×2 IMPLANT
GUIDEWIRE INQWIRE 1.5J.035X260 (WIRE) ×1 IMPLANT
INQWIRE 1.5J .035X260CM (WIRE) ×2
KIT ENCORE 26 ADVANTAGE (KITS) ×2 IMPLANT
KIT ESSENTIALS PG (KITS) ×2 IMPLANT
KIT HEART LEFT (KITS) ×2 IMPLANT
PACK CARDIAC CATHETERIZATION (CUSTOM PROCEDURE TRAY) ×2 IMPLANT
STENT RESOLUTE ONYX 3.0X26 (Permanent Stent) ×2 IMPLANT
TRANSDUCER W/STOPCOCK (MISCELLANEOUS) ×2 IMPLANT
TUBING CIL FLEX 10 FLL-RA (TUBING) ×2 IMPLANT
WIRE COUGAR XT STRL 190CM (WIRE) ×2 IMPLANT

## 2020-04-22 NOTE — ED Notes (Signed)
Patient to cath lab on zoll and pads with RN and tech.

## 2020-04-22 NOTE — Plan of Care (Signed)

## 2020-04-22 NOTE — ED Triage Notes (Signed)
BIB EMS from [facility] ED for STEMI in anterior leads. Rec'd nitro x 2, 250 ml NS, 324 ASA, 4 mg morphine, 4 mg zofran, 4000u heparin at OSH. Patient with CP 10/10 since last night 9 pm. Describes as ripping sensation. Started with nausea this morning.

## 2020-04-22 NOTE — ED Provider Notes (Signed)
[facility] EMERGENCY DEPARTMENT Provider Note   CSN: [id number] Arrival date & time: 04/22/20  1301     History Chief Complaint  Patient presents with  . Chest Pain    Sawyer Mentzer is a 55 y.o. male.  HPI      Won Kreuzer is a 54 y.o. male, with a history of hypercholesterolemia, cardiac cath with stent placement, methamphetamine use, tobacco use, presenting to the ED with chest pain beginning last night around 9 PM. He initially describes the pain as tearing/tearing, but then gives him more vague description later in the interview.  Rates his pain 10/10, nonradiating.  Patient presented to [facility], they noted with a thought was an anterior STEMI, as was sent via EMS to our ED.  Patient received 2 doses of sublingual nitroglycerin, 250 mL of normal saline, 324 mg aspirin, 4 mg morphine, 4 mg Zofran, 4000 units heparin.  At 1 point, patient states his pain was not improved by the above interventions, but then he says that they were. Patient had previous cardiac stent placement, but states his current pain is different from his pain during that event. He denies fever/chills, cough, shortness of breath, abdominal pain, back pain, dizziness, syncope, neurologic deficits, or any other complaints.  Past Medical History:  Diagnosis Date  . Coronary arteriosclerosis    a. PCI LAD 2009 in [place];  b. 12/2010 Cath: LM: nl, LAD: patent stent mid, 25 mid, LCX nl, RCA: non dominant, nl, EF 50%  . History of methamphetamine abuse (HCC)    a. Used x 6 yrs, quit July 2012  . History of tobacco abuse    a. 41 yrs, up to 3 ppd, quit July 2012  . Hypercholesterolemia     Patient Active Problem List   Diagnosis Date Noted  . STEMI (ST elevation myocardial infarction) (HCC) 04/22/2020  . STEMI involving left anterior descending coronary artery (HCC) 04/22/2020  . Coronary arteriosclerosis   . Hypercholesterolemia   . History of methamphetamine abuse (HCC)   .  History of tobacco abuse   . Chest pain 05/08/2011    Past Surgical History:  Procedure Laterality Date  . BACK SURGERY     fusion-lumbar  . CORONARY ANGIOPLASTY WITH STENT PLACEMENT         Family History  Problem Relation Age of Onset  . Heart attack Father        died @ 61  . Coronary artery disease Mother        alive in late 22's    Social History   Tobacco Use  . Smoking status: Former Smoker    Packs/day: 2.00    Years: 41.00    Pack years: 82.00    Types: Cigarettes    Quit date: 09/01/2010    Years since quitting: 9.6  . Smokeless tobacco: Never Used  . Tobacco comment: smoked up to 3 ppd x 41 yrs  Substance Use Topics  . Alcohol use: No  . Drug use: No    Comment: previously used methamphetamines - quit 09/2010    Home Medications Prior to Admission medications   Not on File    Allergies    Patient has no known allergies.  Review of Systems   Review of Systems  Constitutional: Negative for chills, diaphoresis and fever.  Respiratory: Negative for cough and shortness of breath.   Cardiovascular: Positive for chest pain. Negative for leg swelling.  Gastrointestinal: Negative for abdominal pain, diarrhea, nausea and vomiting.  Musculoskeletal:  Negative for back pain.  Neurological: Negative for dizziness, syncope, weakness and numbness.  All other systems reviewed and are negative.   Physical Exam Updated Vital Signs BP 128/86 (BP Location: Right Arm)   Pulse 76   Temp 98.3 F (36.8 C) (Oral)   Resp 16   Ht 5\' 11"  (1.803 m)   Wt 70.3 kg   BMI 21.62 kg/m   Physical Exam Vitals and nursing note reviewed.  Constitutional:      General: He is not in acute distress.    Appearance: He is well-developed. He is not diaphoretic.  HENT:     Head: Normocephalic and atraumatic.     Mouth/Throat:     Mouth: Mucous membranes are moist.     Pharynx: Oropharynx is clear.  Eyes:     Conjunctiva/sclera: Conjunctivae normal.  Cardiovascular:      Rate and Rhythm: Normal rate and regular rhythm.     Pulses: Normal pulses.          Radial pulses are 2+ on the right side and 2+ on the left side.       Posterior tibial pulses are 2+ on the right side and 2+ on the left side.     Heart sounds: Normal heart sounds.     Comments: Tactile temperature in the extremities appropriate and equal bilaterally. Pulmonary:     Effort: Pulmonary effort is normal. No respiratory distress.     Breath sounds: Normal breath sounds.  Abdominal:     Palpations: Abdomen is soft.     Tenderness: There is no abdominal tenderness. There is no guarding.  Musculoskeletal:     Cervical back: Neck supple.     Right lower leg: No edema.     Left lower leg: No edema.  Lymphadenopathy:     Cervical: No cervical adenopathy.  Skin:    General: Skin is warm and dry.  Neurological:     Mental Status: He is alert.     Comments: No noted acute cognitive deficit. Sensation grossly intact to light touch in the extremities.   Grip strengths equal bilaterally.   Strength 5/5 in all extremities.  Cranial nerves III-XII grossly intact.  Handles oral secretions without noted difficulty.  No noted phonation or speech deficit. No facial droop.   Psychiatric:        Mood and Affect: Mood and affect normal.        Speech: Speech normal.        Behavior: Behavior normal.     ED Results / Procedures / Treatments   Labs (all labs ordered are listed, but only abnormal results are displayed) Labs Reviewed  CBC WITH DIFFERENTIAL/PLATELET - Abnormal; Notable for the following components:      Result Value   WBC 15.0 (*)    Neutro Abs 12.8 (*)    All other components within normal limits  COMPREHENSIVE METABOLIC PANEL  LIPASE, BLOOD  TROPONIN I (HIGH SENSITIVITY)    EKG EKG Interpretation  Date/Time:  Sunday April 22 2020 13:05:40 EST Ventricular Rate:  71 PR Interval:    QRS Duration: 95 QT Interval:  420 QTC Calculation: 457 R Axis:   63 Text  Interpretation: Sinus rhythm Low voltage, extremity and precordial leads Probable anteroseptal infarct, old new T wave inversions in V4-V6, borderline elevation V2-V3, no reciprocal changes Confirmed by 07-26-1981 [phone]) on 04/22/2020 3:14:53 PM   Radiology No results found.  Procedures Procedures   Medications Ordered in ED Medications  labetalol (NORMODYNE)  injection 10 mg (has no administration in time range)  hydrALAZINE (APRESOLINE) injection 10 mg (has no administration in time range)  acetaminophen (TYLENOL) tablet 650 mg (has no administration in time range)  ondansetron (ZOFRAN) injection 4 mg (has no administration in time range)  enoxaparin (LOVENOX) injection 40 mg (has no administration in time range)  0.9 %  sodium chloride infusion (has no administration in time range)  sodium chloride flush (NS) 0.9 % injection 3 mL (has no administration in time range)  sodium chloride flush (NS) 0.9 % injection 3 mL (has no administration in time range)  0.9 %  sodium chloride infusion (has no administration in time range)  diazepam (VALIUM) tablet 5 mg (has no administration in time range)  atorvastatin (LIPITOR) tablet 80 mg (has no administration in time range)  aspirin chewable tablet 81 mg (has no administration in time range)  ticagrelor (BRILINTA) tablet 90 mg (has no administration in time range)  carvedilol (COREG) tablet 3.125 mg (has no administration in time range)    ED Course  I have reviewed the triage vital signs and the nursing notes.  Pertinent labs & imaging results that were available during my care of the patient were reviewed by me and considered in my medical decision making (see chart for details).  Clinical Course as of 04/22/20 1531  Sun Apr 22, 2020  1310 Dr. Tresa Endo, cardiologist, and Bjorn Loser, cardiology PA, arrived at the bedside shortly after I performed my initial assessment of the patient. They request we place orders for initial lab work.  They  plan to take the patient to the cardiac Cath Lab.  They also plan to place orders for CT dissection study. [SJ]    Clinical Course User Index [SJ] , Hillard Danker, PA-C   MDM Rules/Calculators/A&P                          Patient presents as a STEMI activation from an outside hospital. Patient is nontoxic appearing, afebrile, not tachycardic on my exam, not tachypneic, not hypotensive, maintains excellent SPO2 on room air, and is in no apparent distress.   I have reviewed the patient's chart to obtain more information.   I reviewed and interpreted the patient's available labs. Chest x-ray unremarkable from outside facility found in care everywhere. Patient was taken to the cardiac Cath Lab by cardiology team prior to results of our ordered labs.  Dissection should be considered based on the patient's description of pain, however, reassuring findings include patient not ill or toxic appearing, no pallor or diaphoresis, no pulse deficit, no neurologic deficit, no abdominal or back pain, no widened mediastinum reported on chest x-ray read from outside facility.  Findings and plan of care discussed with attending physician, Frederick Peers, MD. Dr. Clarene Duke personally evaluated and examined this patient.   Vitals:   04/22/20 1417 04/22/20 1422 04/22/20 1427 04/22/20 1432  BP: 138/90 128/86 127/81 130/87  Pulse: (!) 46 98 99 (!) 101  Resp: 10 (!) 9 14 11   Temp:      TempSrc:      SpO2: 98% 97% 97% 95%  Weight:      Height:         Final Clinical Impression(s) / ED Diagnoses Final diagnoses:  Central chest pain    Rx / DC Orders ED Discharge Orders    None       04/22/20 1532    Little, 04/24/20, MD  04/24/20 2009  

## 2020-04-22 NOTE — ED Notes (Signed)
5 pocket knives given in pouch to wife to secure in vehicle. She was updated on plan for cath and to await in heart/vascular 2 heart waiting area. Due to lockdown, was unable to come into ED at this time.

## 2020-04-22 NOTE — H&P (Addendum)
Cardiology Admission History and Physical:   Patient ID: Eric [name]; [name]: [id number]; DOB: Jun 24, 1965   Admission date: 04/22/2020  Primary Care Provider: Georganna Skeans, MD Primary Cardiologist: No primary care provider on file. New Saw Dr Eric [name] 2013 in-hospital Primary Electrophysiologist: None    Chief Complaint:  STEMI  Patient Profile:   Eric [name] is a 55 y.o. male with a history of DES LAD 2009 in [place], polysubs abuse quit 2013, tob use, FH CAD, HLD  History of Present Illness:   [name] developed chest pain last night without exertion.  He describes the pain as a ripping and tearing pain.  He also had nausea and vomiting.  He thought that the symptoms were GI in nature and took various GI medications without improvement.  He did not sleep very much last night.  Finally, he went to Eric [name] this a.m.  He came by EMS who gave him sublingual nitroglycerin x2 and a 250 cc bolus.  He was still having the pain, it was still severe rated 9/10.  He had labs drawn including a troponin, a CMET, and INR, a CBC with differential, and a urinalysis.  These results are not yet available.  He also had a 6-hour Covid swab done.  In the ER, he was given ASA 324 mg, 4 mg of Zofran, 4 mg of morphine, 4000 units of heparin and 10 mg of Lipitor.  His ECG was felt to represent a STEMI and he was transported emergently to [facility].  He was swabbed for Covid and taken to the Cath Lab.   Past Medical History:  Diagnosis Date  . Coronary arteriosclerosis    a. PCI LAD 2009 in [place];  b. 12/2010 Cath: LM: nl, LAD: patent stent mid, 25 mid, LCX nl, RCA: non dominant, nl, EF 50%  . History of methamphetamine abuse (HCC)    a. Used x 6 yrs, quit July 2012  . History of tobacco abuse    a. 41 yrs, up to 3 ppd, quit July 2012  . Hypercholesterolemia     Past Surgical History:  Procedure Laterality Date  . BACK SURGERY     fusion-lumbar  . CORONARY ANGIOPLASTY WITH STENT PLACEMENT        Medications Prior to Admission: Prior to Admission medications   Medication Sig Start Date End Date Taking? Authorizing Provider  aspirin EC 81 MG tablet Take 81 mg by mouth daily.      [provider]  isosorbide mononitrate (IMDUR) 30 MG 24 hr tablet Take 30 mg by mouth daily.      [provider]  metoprolol tartrate (LOPRESSOR) 25 MG tablet Take 25 mg by mouth 2 (two) times daily.     [provider]  nitroGLYCERIN (NITROSTAT) 0.4 MG SL tablet Place 0.4 mg under the tongue every 5 (five) minutes as needed. Chest pains    [provider]  omeprazole (PRILOSEC) 20 MG capsule Take 20 mg by mouth daily.    [provider]  simvastatin (ZOCOR) 20 MG tablet Take 20 mg by mouth at bedtime.     [provider]     Allergies:   No Known Allergies  Social History:   Social History   Socioeconomic History  . Marital status: Single    Spouse name: Not on file  . Number of children: Not on file  . Years of education: Not on file  . Highest education level: Not on file  Occupational History  . Not on  file  Tobacco Use  . Smoking status: Former Smoker    Packs/day: 2.00    Years: 41.00    Pack years: 82.00    Types: Cigarettes    Quit date: 09/01/2010    Years since quitting: 9.6  . Smokeless tobacco: Never Used  . Tobacco comment: smoked up to 3 ppd x 41 yrs  Substance and Sexual Activity  . Alcohol use: No  . Drug use: No    Comment: previously used methamphetamines - quit 09/2010  . Sexual activity: Not on file  Other Topics Concern  . Not on file  Social History Narrative   [place]ives in [place].  Step-dtr near-by.  Works for a [employer].   Social Determinants of Health   Financial Resource Strain: Not on file  Food Insecurity: Not on file  Transportation Needs: Not on file  Physical Activity: Not on file  Stress: Not on file  Social Connections: Not on file  Intimate Partner Violence: Not on file    Family  History:  The patient's family history includes Coronary artery disease in his mother; Heart attack in his father.   The patient He indicated that the status of his mother is unknown and reported the following: unklnown. He indicated that his father is deceased. He indicated that the status of his brother is unknown and reported the following: 13 brothers and sisters - doesn't know their health history.    ROS:  Please see the history of present illness.  All other ROS reviewed and negative.     Physical Exam/Data:   Vitals:   04/22/20 1306 04/22/20 1308 04/22/20 1315 04/22/20 1333  BP: 128/86  137/86   Pulse: 76  (!) 103   Resp: 16  17   Temp: 98.3 F (36.8 C)     TempSrc: Oral     SpO2:   99% 98%  Weight:  70.3 kg    Height:  5\' 11"  (1.803 m)     No intake or output data in the 24 hours ending 04/22/20 1349 Filed Weights   04/22/20 1308  Weight: 70.3 kg   Body mass index is 21.62 kg/m.  General:  Well nourished, well developed, male in acute distress HEENT: normal Lymph: no adenopathy Neck:  JVD not elevated Endocrine:  No thryomegaly Vascular: No carotid bruits; 4/4 extremity pulses 2+ bilaterally  Cardiac:  normal S1, S2; RRR; no murmur, no rub or gallop  Lungs:  Rales bases bilaterally, no wheezing, rhonchi   Abd: soft, nontender, no hepatomegaly  Ext: no edema Musculoskeletal:  No deformities, BUE and BLE strength normal and equal Skin: warm and dry  Neuro:  CNs 2-12 intact, no focal abnormalities noted Psych:  Normal affect    EKG:  The ECG was personally reviewed: Initial ECG was sinus rhythm, heart rate 74, J-point elevation in V3 and ST changes in V4-5.  Also with T wave changes from ECG 2013 Telemetry: Sinus rhythm PACs  Relevant CV Studies:  CATH: 12/23/2010 FINDINGS:  The left main was short and widely patent.  Left circumflex is a large dominant vessel.  There is a small OM1 and OM2 both of which were patent.  The OM3 was medium-sized and  widely patent.  The left PDA was medium-sized and widely patent.  Left anterior descending is a large vessel proximally and medium sized in the mid to distal vessel.  There is a patent stent in the mid LAD after the origin of 1st diagonal just proximal to the  stent, there is a 25% stenosis.  The 1st diagonal is medium-sized and widely patent.  The right coronary artery is small nondominant vessel which is widely patent.  Left ventriculogram shows overall normal left ventricular function with an estimated ejection fraction of 50%.  Abdominal aortogram shows no abdominal aortic aneurysm.  The bilateral single renal arteries which were widely patent.  The SMA also appears widely patent.  HEMODYNAMIC DATA:  Left ventricular pressure 98/3, aortic pressure 101/61 with a mean aortic pressure of 79 mmHg.  Left ventricular end- diastolic pressure 7 mmHg.  IMPRESSION: 1. Patent left anterior descending stent. 2. No hemodynamically significant coronary artery disease. 3. Ejection fraction of 50%. 4. No abdominal aortic aneurysm or renal artery stenosis.  RECOMMENDATIONS:  This patient needs aggressive secondary prevention including smoking cessation and continued medical therapy.  Laboratory Data:  ChemistryNo results for input(s): NA, K, CL, CO2, GLUCOSE, BUN, CREATININE, CALCIUM, GFRNONAA, GFRAA, ANIONGAP in the last 168 hours.  No results for input(s): PROT, ALBUMIN, AST, ALT, ALKPHOS, BILITOT in the last 168 hours. Hematology Recent Labs  Lab 04/22/20 1304  WBC 15.0*  RBC 4.66  HGB 13.4  HCT 40.3  MCV 86.5  MCH 28.8  MCHC 33.3  RDW 13.2  PLT 297   Cardiac Enzymes  High Sensitivity Troponin:  No results for input(s): TROPONINIHS in the last 720 hours.   BNPNo results for input(s): BNP, PROBNP in the last 168 hours.  DDimer No results for input(s): DDIMER in the last 168 hours. Lipids:  Lab Results  Component Value Date   CHOL 127 12/20/2010   HDL 37 (L)  12/20/2010   LDLCALC 64 12/20/2010   TRIG 128 12/20/2010   CHOLHDL 3.4 12/20/2010   INR:  Lab Results  Component Value Date   INR 1.06 05/09/2011   INR 0.92 05/08/2011   INR 1.07 12/20/2010   A1c: No results found for: HGBA1C Thyroid:  Lab Results  Component Value Date   TSH 1.414 05/09/2011    Radiology/Studies:  No results found.  Assessment and Plan:   1. STEMI -His ECG has anterior ST elevation and he has ongoing pain -He is being taken emergently to the Cath Lab with further evaluation and treatment depending on the results -He will be screened for cardiac risk factors into their control. -If no cause for his pain is found at cath, he should be evaluated for dissection based on the characteristics of his pain and the lack of response to GI medications.  Principal Problem:   STEMI (ST elevation myocardial infarction) (HCC)     For questions or updates, please contact [facility] Please consult [email or web address] for contact info under Cardiology/STEMI.    Signed, Theodore Demark, PA-C  04/22/2020 1:49 PM   Patient seen and examined. Agree with assessment and plan.  Mr. Kelcey Wickstrom is a 55 year old male who has known CAD and underwent stenting of his LAD while in [place] in 2009.  He apparently had a repeat cath in 2012 and the stent was patent.  He had seen Dr. Antoine Terry in 2013.  Patient has a history of tobacco use.  Last evening he developed lower sternal chest discomfort described as tightness associated with nausea and vomiting shortly he felt symptoms were GI mediated but they did not improve with Mylanta.  He presented to [facility] this morning.  ECG showed anteroseptal Q waves with T wave inversion anterolaterally.  A code STEMI was activated.  He had continued to have chest pain.  In the  ER prior to transfer he was given heparin 4000 units, sublingual nitroglycerin, in addition to morphine.  Upon arrival to [facility] ER, his chest pain had improved but he  continued to have persistent discomfort.  ECG was unchanged and continue to show anterolateral T wave inversion which was new since 2013 ECG.  ER, hemodynamics were stable.  There was no JVD.  He had scattered rhonchi in lung fields consistent with tobacco history.  Rhythm was regular without ectopy.  I discussed the catheterization procedure with the patient who agreed have this performed.  Emergent catheterization reveals total occlusion of the LAD at the site of the prior LAD stent with TIMI 0 flow.  He had a large dominant circumflex system with a nondominant RCA.  He underwent successful intervention with reopening of the LAD with ultimate insertion of a 3 oh x26 mm Resolute stent with restoration of brisk TIMI-3 flow and chest pain resolution.  Left ventriculography suggest significant anterolateral hypokinesis extending around the apex to the distal inferior wall.  A 2D echo Doppler study will be obtained.  He was swabbed for Covid which is still pending.  In light of LV dysfunction will initiate carvedilol for beta-blocker therapy.  He will need initiation of ARB therapy with potential transition to [facility] if LV function remains depressed.  I discussed the importance of smoking cessation.  Will initiate high potency statin therapy with atorvastatin 80 mg.   Lennette Bihari, MD, [facility] 04/22/2020 3:01 PM

## 2020-04-23 ENCOUNTER — Encounter (HOSPITAL_COMMUNITY): Payer: Self-pay | Admitting: Cardiovascular Disease

## 2020-04-23 ENCOUNTER — Inpatient Hospital Stay (HOSPITAL_COMMUNITY): Payer: BC Managed Care – PPO

## 2020-04-23 DIAGNOSIS — I5021 Acute systolic (congestive) heart failure: Secondary | ICD-10-CM

## 2020-04-23 DIAGNOSIS — I2102 ST elevation (STEMI) myocardial infarction involving left anterior descending coronary artery: Secondary | ICD-10-CM

## 2020-04-23 DIAGNOSIS — E785 Hyperlipidemia, unspecified: Secondary | ICD-10-CM

## 2020-04-23 DIAGNOSIS — I472 Ventricular tachycardia: Secondary | ICD-10-CM | POA: Diagnosis not present

## 2020-04-23 LAB — POCT I-STAT, CHEM 8
BUN: 10 mg/dL (ref 6–20)
Calcium, Ion: 1.21 mmol/L (ref 1.15–1.40)
Chloride: 103 mmol/L (ref 98–111)
Creatinine, Ser: 0.6 mg/dL — ABNORMAL LOW (ref 0.61–1.24)
Glucose, Bld: 126 mg/dL — ABNORMAL HIGH (ref 70–99)
HCT: 39 % (ref 39.0–52.0)
Hemoglobin: 13.3 g/dL (ref 13.0–17.0)
Potassium: 3.6 mmol/L (ref 3.5–5.1)
Sodium: 138 mmol/L (ref 135–145)
TCO2: 23 mmol/L (ref 22–32)

## 2020-04-23 LAB — ECHOCARDIOGRAM COMPLETE
Area-P 1/2: 3.99 cm2
Calc EF: 37.9 %
Height: 71 in
S' Lateral: 4 cm
Single Plane A2C EF: 38.2 %
Single Plane A4C EF: 39.1 %
Weight: 2532.64 oz

## 2020-04-23 LAB — LIPID PANEL
Cholesterol: 141 mg/dL (ref 0–200)
HDL: 36 mg/dL — ABNORMAL LOW (ref >40)
LDL Cholesterol: 89 mg/dL (ref 0–99)
Total CHOL/HDL Ratio: 3.9 RATIO
Triglycerides: 79 mg/dL (ref <150)
VLDL: 16 mg/dL (ref 0–40)

## 2020-04-23 LAB — BASIC METABOLIC PANEL
Anion gap: 8 (ref 5–15)
BUN: 9 mg/dL (ref 6–20)
CO2: 19 mmol/L — ABNORMAL LOW (ref 22–32)
Calcium: 8.1 mg/dL — ABNORMAL LOW (ref 8.9–10.3)
Chloride: 113 mmol/L — ABNORMAL HIGH (ref 98–111)
Creatinine, Ser: 0.76 mg/dL (ref 0.61–1.24)
GFR, Estimated: >60 mL/min (ref >60)
Glucose, Bld: 101 mg/dL — ABNORMAL HIGH (ref 70–99)
Potassium: 3.4 mmol/L — ABNORMAL LOW (ref 3.5–5.1)
Sodium: 140 mmol/L (ref 135–145)

## 2020-04-23 LAB — CBC
HCT: 37.9 % — ABNORMAL LOW (ref 39.0–52.0)
Hemoglobin: 12.5 g/dL — ABNORMAL LOW (ref 13.0–17.0)
MCH: 28.7 pg (ref 26.0–34.0)
MCHC: 33 g/dL (ref 30.0–36.0)
MCV: 86.9 fL (ref 80.0–100.0)
Platelets: 256 10*3/uL (ref 150–400)
RBC: 4.36 MIL/uL (ref 4.22–5.81)
RDW: 13.3 % (ref 11.5–15.5)
WBC: 11.6 10*3/uL — ABNORMAL HIGH (ref 4.0–10.5)
nRBC: 0 % (ref 0.0–0.2)

## 2020-04-23 LAB — POCT ACTIVATED CLOTTING TIME
Activated Clotting Time: 214 seconds
Activated Clotting Time: 428 seconds

## 2020-04-23 MED ORDER — CHLORHEXIDINE GLUCONATE CLOTH 2 % EX PADS
6.0000 | MEDICATED_PAD | Freq: Every day | CUTANEOUS | Status: DC
  Administered 2020-04-23: 6 via TOPICAL

## 2020-04-23 MED ORDER — LOSARTAN POTASSIUM 25 MG PO TABS
25.0000 mg | ORAL_TABLET | Freq: Every day | ORAL | Status: DC
  Administered 2020-04-23 – 2020-04-25 (×3): 25 mg via ORAL
  Filled 2020-04-23 (×3): qty 1

## 2020-04-23 MED ORDER — PERFLUTREN LIPID MICROSPHERE
1.0000 mL | INTRAVENOUS | Status: AC | PRN
  Administered 2020-04-23: 2 mL via INTRAVENOUS
  Filled 2020-04-23: qty 10

## 2020-04-23 NOTE — Progress Notes (Signed)
CARDIAC REHAB PHASE I   PRE:  Rate/Rhythm: 79 SR    BP: sitting 113/73    SaO2: 100 RA  MODE:  Ambulation: 370 ft   POST:  Rate/Rhythm: 92 SR with occ PVC    BP: sitting 126/90     SaO2: 100 RA  Tolerated well, no c/o. To recliner, VSS. Began discussing MI, stent, Brilinta importance, restrictions and smoking cessation. Pt receptive. Will need to check his insurance coverage for Brilinta. He is making plans to quit smoking. Gave him heart healthy diet but will f/u for more education after echo is read. [phone]   Harriet Masson CES, ACSM 04/23/2020 1:50 PM

## 2020-04-23 NOTE — Progress Notes (Signed)
Progress Note  Patient Name: [name] Date of Encounter: 04/23/2020  [facility] Cardiologist: Nicki Guadalajara, MD   Subjective   Feels 100% better. No chest pain.  Inpatient Medications    Scheduled Meds: . aspirin  81 mg Oral Daily  . atorvastatin  80 mg Oral Daily  . carvedilol  3.125 mg Oral BID WC  . Chlorhexidine Gluconate Cloth  6 each Topical Daily  . enoxaparin (LOVENOX) injection  40 mg Subcutaneous Q24H  . sodium chloride flush  3 mL Intravenous Q12H  . ticagrelor  90 mg Oral BID   Continuous Infusions: . sodium chloride 100 mL/hr at 04/23/20 0900  . sodium chloride     PRN Meds: sodium chloride, acetaminophen, diazepam, ondansetron (ZOFRAN) IV, sodium chloride flush   Vital Signs    Vitals:   04/23/20 0416 04/23/20 0500 04/23/20 0530 04/23/20 0800  BP:  115/71 113/78 105/70  Pulse:  76 80 89  Resp:  (!) 21 20 (!) 24  Temp: 99 F (37.2 C)     TempSrc: Oral     SpO2:  96% 97% 98%  Weight:   71.8 kg   Height:        Intake/Output Summary (Last 24 hours) at 04/23/2020 1000 Last data filed at 04/23/2020 0900 Gross per 24 hour  Intake 1882.81 ml  Output 2750 ml  Net -867.19 ml   Last 3 Weights 04/23/2020 04/22/2020 04/22/2020  Weight (lbs) 158 lb 4.6 oz 157 lb 13.6 oz 155 lb  Weight (kg) 71.8 kg 71.6 kg 70.308 kg      Telemetry    NSR with multiple runs of NSVT 10-15 beats - Personally Reviewed  ECG    NSR with diffuse anterior Q waves and T wave inversion.   - Personally Reviewed  Physical Exam   GEN: No acute distress.   Neck: No JVD Cardiac: RRR, no murmurs, rubs, or gallops.  Respiratory: Clear to auscultation bilaterally. GI: Soft, nontender, non-distended  MS: No edema; No deformity. Right wrist without hematoma. Neuro:  Nonfocal  Psych: Normal affect   Labs    High Sensitivity Troponin:   Recent Labs  Lab 04/22/20 1304 04/22/20 1534 04/22/20 1747  TROPONINIHS 5,228* >27,000* >27,000*      Chemistry Recent Labs   Lab 04/22/20 1304 04/22/20 1534 04/23/20 0056  NA 134*  --  140  K 4.3  --  3.4*  CL 104  --  113*  CO2 20*  --  19*  GLUCOSE 126*  --  101*  BUN 11  --  9  CREATININE 0.75 0.68 0.76  CALCIUM 8.9  --  8.1*  PROT 6.3*  --   --   ALBUMIN 3.8  --   --   AST 66*  --   --   ALT 27  --   --   ALKPHOS 55  --   --   BILITOT 1.2  --   --   GFRNONAA >60 >60 >60  ANIONGAP 10  --  8     Hematology Recent Labs  Lab 04/22/20 1304 04/22/20 1534 04/23/20 0056  WBC 15.0* 11.5* 11.6*  RBC 4.66 4.04* 4.36  HGB 13.4 11.8* 12.5*  HCT 40.3 34.6* 37.9*  MCV 86.5 85.6 86.9  MCH 28.8 29.2 28.7  MCHC 33.3 34.1 33.0  RDW 13.2 13.1 13.3  PLT 297 247 256    BNPNo results for input(s): BNP, PROBNP in the last 168 hours.   DDimer No results for input(s): DDIMER  in the last 168 hours.   Radiology    CARDIAC CATHETERIZATION  Result Date: 04/22/2020  2nd Diag lesion is 20% stenosed.  Mid LAD to Dist LAD lesion is 100% stenosed.  Post intervention, there is a 0% residual stenosis.  Mid LAD lesion is 20% stenosed.  A stent was successfully placed.  The left ventricular ejection fraction is 25-35% by visual estimate.  LV end diastolic pressure is mildly elevated.  There is severe left ventricular systolic dysfunction.  Acute anterolateral myocardial infarction secondary to total occlusion of the LAD immediately proximal to the previously placed mid LAD stent with initial TIMI 0 flow. No significant concomitant CAD with a normal ramus intermediate, a large dominant normal left circumflex vessel, and a normal nondominant RCA. Successful PCI to the LAD with ultimate insertion of a 3.0 x 26 mm Resolute stent postdilated with taper from 3.2-3.80mm with residual stenosis being reduced to 0%.  There is resumption of brisk TIMI-3 flow.  There is no change in the mild 20% stenosis proximal to the second diagonal vessel and 20% ostial second diagonal stenosis. Severe LV dysfunction with severe  hypocontractility involving the mid, distal anterolateral wall extending around the apex and involving the apical inferior segment with EF estimated at approximately 25 -30% acutely.  LVEDP 19 mm. RECOMMENDATION: DAPT for minimum of 12 months but recommend longer duration.  Guideline directed medical therapy for LV dysfunction with initiation of carvedilol, with subsequent ARB and potential transition to Entresto, aldosterone blockade and possible SGLT2 inhibition depending upon LV function recovery.  Aggressive lipid-lowering therapy with target LDL less than 70.  Smoking cessation.   Cardiac Studies   Coronary/Graft Acute MI Revascularization  LEFT HEART CATH AND CORONARY ANGIOGRAPHY    Conclusion    2nd Diag lesion is 20% stenosed.  Mid LAD to Dist LAD lesion is 100% stenosed.  Post intervention, there is a 0% residual stenosis.  Mid LAD lesion is 20% stenosed.  A stent was successfully placed.  The left ventricular ejection fraction is 25-35% by visual estimate.  LV end diastolic pressure is mildly elevated.  There is severe left ventricular systolic dysfunction.   Acute anterolateral myocardial infarction secondary to total occlusion of the LAD immediately proximal to the previously placed mid LAD stent with initial TIMI 0 flow.  No significant concomitant CAD with a normal ramus intermediate, a large dominant normal left circumflex vessel, and a normal nondominant RCA.  Successful PCI to the LAD with ultimate insertion of a 3.0 x 26 mm Resolute stent postdilated with taper from 3.2-3.33mm with residual stenosis being reduced to 0%.  There is resumption of brisk TIMI-3 flow.  There is no change in the mild 20% stenosis proximal to the second diagonal vessel and 20% ostial second diagonal stenosis.  Severe LV dysfunction with severe hypocontractility involving the mid, distal anterolateral wall extending around the apex and involving the apical inferior segment with EF  estimated at approximately 25 -30% acutely.  LVEDP 19 mm.  RECOMMENDATION: DAPT for minimum of 12 months but recommend longer duration.  Guideline directed medical therapy for LV dysfunction with initiation of carvedilol, with subsequent ARB and potential transition to Entresto, aldosterone blockade and possible SGLT2 inhibition depending upon LV function recovery.  Aggressive lipid-lowering therapy with target LDL less than 70.  Smoking cessation.      Patient Profile     55 y.o. male remote stent of LAD in 2009 presents late in course of anterior STEMI. Reports he hasn't taken any meds  in 3 years. +smoker.   Assessment & Plan    1. Acute anterior STEMI. Occluded mid LAD at site of prior stent. Now s/p PCI with new DES. Significant myocardial injury with troponin > 27,000. Low EF. Echo pending. Needs DAPT indefinitely with 2 layers of stent in LAD. Start beta blocker. Risk factor modification 2. Acute systolic CHF. EF low by cath. Awaiting Echo today. Will initiate beta blocker and ARB. Consider switching to [facility] and adding aldactone prior to DC. Low sodium diet. 3. HLD. Lipid status pending. On high dose lipitor 4. Tobacco abuse- counsel on smoking cessation. 5. NSVT. Monitor. May need to consider Lifevest if EF < 35%.     For questions or updates, please contact [facility] Please consult [email or web address] for contact info under        Signed, Renold Kozar [place], MD  04/23/2020, 10:00 AM

## 2020-04-23 NOTE — Progress Notes (Signed)
  Echocardiogram 2D Echocardiogram with definity has been performed.  Leta Jungling M 04/23/2020, 10:25 AM

## 2020-04-24 DIAGNOSIS — E7800 Pure hypercholesterolemia, unspecified: Secondary | ICD-10-CM | POA: Diagnosis not present

## 2020-04-24 DIAGNOSIS — Z87891 Personal history of nicotine dependence: Secondary | ICD-10-CM | POA: Diagnosis not present

## 2020-04-24 DIAGNOSIS — I2102 ST elevation (STEMI) myocardial infarction involving left anterior descending coronary artery: Secondary | ICD-10-CM | POA: Diagnosis not present

## 2020-04-24 DIAGNOSIS — I5021 Acute systolic (congestive) heart failure: Secondary | ICD-10-CM | POA: Diagnosis not present

## 2020-04-24 LAB — BASIC METABOLIC PANEL
Anion gap: 10 (ref 5–15)
BUN: 9 mg/dL (ref 6–20)
CO2: 20 mmol/L — ABNORMAL LOW (ref 22–32)
Calcium: 8.7 mg/dL — ABNORMAL LOW (ref 8.9–10.3)
Chloride: 109 mmol/L (ref 98–111)
Creatinine, Ser: 0.8 mg/dL (ref 0.61–1.24)
GFR, Estimated: >60 mL/min (ref >60)
Glucose, Bld: 135 mg/dL — ABNORMAL HIGH (ref 70–99)
Potassium: 3.7 mmol/L (ref 3.5–5.1)
Sodium: 139 mmol/L (ref 135–145)

## 2020-04-24 LAB — HEMOGLOBIN A1C
Hgb A1c MFr Bld: 5.4 % (ref 4.8–5.6)
Mean Plasma Glucose: 108.28 mg/dL

## 2020-04-24 MED ORDER — SPIRONOLACTONE 12.5 MG HALF TABLET
12.5000 mg | ORAL_TABLET | Freq: Every day | ORAL | Status: DC
  Administered 2020-04-24 – 2020-04-25 (×2): 12.5 mg via ORAL
  Filled 2020-04-24 (×2): qty 1

## 2020-04-24 NOTE — Progress Notes (Signed)
    Patient presented with anterior STEMI, s/p stenting to LAD. Echo with EF noted at 25-30%. Meets criteria for Lifevest. Orders placed and rep notified.   SignedLaverda Page, NP-C 04/24/2020, 1:26 PM Pager: [phone]

## 2020-04-24 NOTE — Progress Notes (Signed)
CARDIAC REHAB PHASE I   PRE:  Rate/Rhythm: 82 SR    BP: sitting 103/75    SaO2: 100 RA  MODE:  Ambulation: 740 ft   POST:  Rate/Rhythm: 92 SR    BP: sitting 115/79     SaO2: 99 RA  Tolerated well, no c/o. Discussed HF management, low sodium diet, exercise, and CRPII. Pt voiced understanding. He is serious about quitting smoking. Will refer to [facility] CRPII. He would like to f/u in [place] with cardiology. [phone]   Harriet Masson CES, ACSM 04/24/2020 1:35 PM

## 2020-04-24 NOTE — Care Management (Signed)
1440 04-24-20 Patient is a transfer from [facility]. Patient in need for a Life Vest. Case Manager has submitted the information to [employer] with the patient's verbal permission to see if insurance will approve the [facility]. 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 Benefits check submitted for Brilinta and Entresto. Patient does not have a primary care provider- he will call his insurance to see what provider in [place] is in network. Case Manager will follow for additional needs. Gala Lewandowsky, RN,BSN Case Manager

## 2020-04-24 NOTE — TOC Benefit Eligibility Note (Signed)
Transition of Care [facility]) Benefit Eligibility Note    Patient Details  Name: [name] MRN: [id number] Date of Birth: [birth year]   Medication/Dose: BRILINTA  90 MG BID  Covered?: Yes  TIER: 2 DRUG PREFERRED BRAND  Prescription Coverage Preferred Pharmacy: [facility] with Person/Company/Phone Number:: ADALAN  @  [employer] # [phone]  Co-Pay: 25 % OF TOTAL COST  Prior Approval: No  Deductible: Unmet (OUT-OF-POCKET-UNMET)  Additional Notes: ENTRESTO  24-26 MG BID COVER-YES CO-PAY-$569.26  TIER- 2 DRUG PREFERRED BRAND  P/A-NO  Q/L TWO PILL PER DAY    Mardene Sayer Phone Number: 04/24/2020, 3:48 PM

## 2020-04-24 NOTE — Progress Notes (Signed)
Heart Failure Nurse Navigator Progress Note  PCP: Eric Skeans, MD PCP-Cardiologist: Eric Limbo., MD Admission Diagnosis: STEMI Admitted from: home with spouse  Presentation:   [name] presented with chest pain from [facility]. Successful PCI 2/20. Pt DC with LifeVest. Hadn't taken medications x 3 years. Hx of substance abuse, quit when grandchild born, plans and educated on smoking cessation.   ECHO/ LVEF: 25-30%  Clinical Course:  Past Medical History:  Diagnosis Date  . Coronary arteriosclerosis    a. PCI LAD 2009 in [place];  b. 12/2010 Cath: LM: nl, LAD: patent stent mid, 25 mid, LCX nl, RCA: non dominant, nl, EF 50%  . History of methamphetamine abuse (HCC)    a. Used x 6 yrs, quit July 2012  . History of tobacco abuse    a. 41 yrs, up to 3 ppd, quit July 2012  . Hypercholesterolemia      Social History   Socioeconomic History  . Marital status: Married    Spouse name: [name]  . Number of children: Not on file  . Years of education: Not on file  . Highest education level: Not on file  Occupational History  . Not on file  Tobacco Use  . Smoking status: Former Smoker    Packs/day: 2.00    Years: 41.00    Pack years: 82.00    Types: Cigarettes    Quit date: 09/01/2010    Years since quitting: 9.6  . Smokeless tobacco: Never Used  . Tobacco comment: smoked up to 3 ppd x 41 yrs  Vaping Use  . Vaping Use: Never used  Substance and Sexual Activity  . Alcohol use: No  . Drug use: No    Comment: previously used methamphetamines - quit 09/2010  . Sexual activity: Not on file  Other Topics Concern  . Not on file  Social History Narrative   Lives in [place].  Step-dtr near-by.  Works for a [employer].   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  Items for Follow-up on DC/TOC: -HF education -medication optimization -financial constraints -med cost/pt assistance  Eric Rocks, RN, BSN Heart Failure Nurse Navigator [phone]

## 2020-04-24 NOTE — Progress Notes (Signed)
Progress Note  Patient Name: [name] Date of Encounter: 04/24/2020  [facility] Cardiologist: Nicki Guadalajara, MD   Subjective   Feels great. No chest pain or dyspnea. Ambulated in halls without problem.  Inpatient Medications    Scheduled Meds: . aspirin  81 mg Oral Daily  . atorvastatin  80 mg Oral Daily  . carvedilol  3.125 mg Oral BID WC  . Chlorhexidine Gluconate Cloth  6 each Topical Daily  . enoxaparin (LOVENOX) injection  40 mg Subcutaneous Q24H  . losartan  25 mg Oral Daily  . sodium chloride flush  3 mL Intravenous Q12H  . ticagrelor  90 mg Oral BID   Continuous Infusions: . sodium chloride 100 mL/hr at 04/23/20 1045  . sodium chloride     PRN Meds: sodium chloride, acetaminophen, diazepam, ondansetron (ZOFRAN) IV, sodium chloride flush   Vital Signs    Vitals:   04/24/20 0400 04/24/20 0500 04/24/20 0600 04/24/20 0700  BP: 106/76 (!) 86/65 106/79 104/72  Pulse: 69 66 78 72  Resp: Temp: 98.6 F (37 C)   (!) 97.5 F (36.4 C)  TempSrc:      SpO2: 95% 98% 99% 97%  Weight: 72.8 kg     Height:        Intake/Output Summary (Last 24 hours) at 04/24/2020 0909 Last data filed at 04/24/2020 0730 Gross per 24 hour  Intake 360 ml  Output 1625 ml  Net -1265 ml   Last 3 Weights 04/24/2020 04/23/2020 04/22/2020  Weight (lbs) 160 lb 7.9 oz 158 lb 4.6 oz 157 lb 13.6 oz  Weight (kg) 72.8 kg 71.8 kg 71.6 kg      Telemetry    NSR with last NSVT yesterday - Personally Reviewed  ECG    NSR with diffuse anterior Q waves and T wave inversion.   - Personally Reviewed  Physical Exam   GEN: No acute distress.   Neck: No JVD Cardiac: RRR, no murmurs, rubs, or gallops.  Respiratory: Clear to auscultation bilaterally. GI: Soft, nontender, non-distended  MS: No edema; No deformity. Right wrist without hematoma. Neuro:  Nonfocal  Psych: Normal affect   Labs    High Sensitivity Troponin:   Recent Labs  Lab 04/22/20 1304 04/22/20 1534  04/22/20 1747  TROPONINIHS 5,228* >27,000* >27,000*      Chemistry Recent Labs  Lab 04/22/20 1304 04/22/20 1355 04/22/20 1534 04/23/20 0056 04/24/20 0057  NA 134* 138  --  140 139  K 4.3 3.6  --  3.4* 3.7  CL 104 103  --  113* 109  CO2 20*  --   --  19* 20*  GLUCOSE 126* 126*  --  101* 135*  BUN 11 10  --  9 9  CREATININE 0.75 0.60* 0.68 0.76 0.80  CALCIUM 8.9  --   --  8.1* 8.7*  PROT 6.3*  --   --   --   --   ALBUMIN 3.8  --   --   --   --   AST 66*  --   --   --   --   ALT 27  --   --   --   --   ALKPHOS 55  --   --   --   --   BILITOT 1.2  --   --   --   --   GFRNONAA >60  --  >60 >60 >60  ANIONGAP 10  --   --  8 10     Hematology Recent Labs  Lab 04/22/20 1304 04/22/20 1355 04/22/20 1534 04/23/20 0056  WBC 15.0*  --  11.5* 11.6*  RBC 4.66  --  4.04* 4.36  HGB 13.4 13.3 11.8* 12.5*  HCT 40.3 39.0 34.6* 37.9*  MCV 86.5  --  85.6 86.9  MCH 28.8  --  29.2 28.7  MCHC 33.3  --  34.1 33.0  RDW 13.2  --  13.1 13.3  PLT 297  --  247 256    BNPNo results for input(s): BNP, PROBNP in the last 168 hours.   DDimer No results for input(s): DDIMER in the last 168 hours.   Radiology    CARDIAC CATHETERIZATION  Result Date: 04/22/2020  2nd Diag lesion is 20% stenosed.  Mid LAD to Dist LAD lesion is 100% stenosed.  Post intervention, there is a 0% residual stenosis.  Mid LAD lesion is 20% stenosed.  A stent was successfully placed.  The left ventricular ejection fraction is 25-35% by visual estimate.  LV end diastolic pressure is mildly elevated.  There is severe left ventricular systolic dysfunction.  Acute anterolateral myocardial infarction secondary to total occlusion of the LAD immediately proximal to the previously placed mid LAD stent with initial TIMI 0 flow. No significant concomitant CAD with a normal ramus intermediate, a large dominant normal left circumflex vessel, and a normal nondominant RCA. Successful PCI to the LAD with ultimate insertion of a 3.0  x 26 mm Resolute stent postdilated with taper from 3.2-3.61mm with residual stenosis being reduced to 0%.  There is resumption of brisk TIMI-3 flow.  There is no change in the mild 20% stenosis proximal to the second diagonal vessel and 20% ostial second diagonal stenosis. Severe LV dysfunction with severe hypocontractility involving the mid, distal anterolateral wall extending around the apex and involving the apical inferior segment with EF estimated at approximately 25 -30% acutely.  LVEDP 19 mm. RECOMMENDATION: DAPT for minimum of 12 months but recommend longer duration.  Guideline directed medical therapy for LV dysfunction with initiation of carvedilol, with subsequent ARB and potential transition to Entresto, aldosterone blockade and possible SGLT2 inhibition depending upon LV function recovery.  Aggressive lipid-lowering therapy with target LDL less than 70.  Smoking cessation.  ECHOCARDIOGRAM COMPLETE  Result Date: 04/23/2020    ECHOCARDIOGRAM REPORT   Patient Name:   [name] Date of Exam: 04/23/2020 Medical Rec #:  [id number]       Height:       71.0 in Accession #:    [id number]      Weight:       158.3 lb Date of Birth:  [birth year]        BSA:          1.909 m Patient Age:    54 years        BP:           101/67 mmHg Patient Gender: M               HR:           77 bpm. Exam Location:  Inpatient Procedure: 2D Echo, Cardiac Doppler, Color Doppler and Intracardiac            Opacification Agent Indications:    Acute myocardial infarction, unspecified I21.9  History:        Patient has no prior history of Echocardiogram examinations.  CHF, CAD, Arrythmias:NSVT; Risk Factors:Current Smoker and                 Dyslipidemia.  Sonographer:    Leta Jungling RDCS Referring Phys: [phone] THOMAS A KELLY IMPRESSIONS  1. LVEF is severely depressed There is akinesis of the mid, distal and apical LV apex     Global longitudinal strain is -8.2%. Left ventricular ejection fraction, by estimation, is  25 to 30%. The left ventricle has severely decreased function. Left ventricular diastolic parameters are consistent with Grade I diastolic dysfunction (impaired relaxation).  2. Right ventricular systolic function is normal. The right ventricular size is normal. There is normal pulmonary artery systolic pressure.  3. The mitral valve is normal in structure. Trivial mitral valve regurgitation.  4. The aortic valve is abnormal. Aortic valve regurgitation is not visualized. Mild to moderate aortic valve sclerosis/calcification is present, without any evidence of aortic stenosis.  5. The inferior vena cava is normal in size with greater than 50% respiratory variability, suggesting right atrial pressure of 3 mmHg. FINDINGS  Left Ventricle: LVEF is severely depressed There is akinesis of the mid, distal and apical LV apex Global longitudinal strain is -8.2%. Left ventricular ejection fraction, by estimation, is 25 to 30%. The left ventricle has severely decreased function. Definity contrast agent was given IV to delineate the left ventricular endocardial borders. The left  ventricular internal cavity size was normal in size. There is no left ventricular hypertrophy. Left ventricular diastolic parameters are consistent with Grade I diastolic dysfunction (impaired relaxation). Right Ventricle: The right ventricular size is normal. Right vetricular wall thickness was not assessed. Right ventricular systolic function is normal. There is normal pulmonary artery systolic pressure. The tricuspid regurgitant velocity is 1.64 m/s, and with an assumed right atrial pressure of 3 mmHg, the estimated right ventricular systolic pressure is 13.8 mmHg. Left Atrium: Left atrial size was normal in size. Right Atrium: Right atrial size was normal in size. Pericardium: There is no evidence of pericardial effusion. Mitral Valve: The mitral valve is normal in structure. Trivial mitral valve regurgitation. Tricuspid Valve: The tricuspid valve  is normal in structure. Tricuspid valve regurgitation is trivial. Aortic Valve: The aortic valve is abnormal. Aortic valve regurgitation is not visualized. Mild to moderate aortic valve sclerosis/calcification is present, without any evidence of aortic stenosis. Pulmonic Valve: The pulmonic valve was grossly normal. Pulmonic valve regurgitation is not visualized. Aorta: The aortic root and ascending aorta are structurally normal, with no evidence of dilitation. Venous: The inferior vena cava is normal in size with greater than 50% respiratory variability, suggesting right atrial pressure of 3 mmHg. IAS/Shunts: The interatrial septum was not assessed.  LEFT VENTRICLE PLAX 2D LVIDd:         5.00 cm      Diastology LVIDs:         4.00 cm      LV e' medial:    5.51 cm/s LV PW:         1.00 cm      LV E/e' medial:  9.6 LV IVS:        1.00 cm      LV e' lateral:   4.22 cm/s LVOT diam:     2.10 cm      LV E/e' lateral: 12.6 LV SV:         47 LV SV Index:   25 LVOT Area:     3.46 cm  LV Volumes (MOD) LV vol d, MOD A2C: 165.0  ml LV vol d, MOD A4C: 147.0 ml LV vol s, MOD A2C: 102.0 ml LV vol s, MOD A4C: 89.5 ml LV SV MOD A2C:     63.0 ml LV SV MOD A4C:     147.0 ml LV SV MOD BP:      59.1 ml RIGHT VENTRICLE RV S prime:     11.20 cm/s TAPSE (M-mode): 1.5 cm LEFT ATRIUM             Index       RIGHT ATRIUM           Index LA diam:        2.90 cm 1.52 cm/m  RA Area:     10.80 cm LA Vol (A2C):   30.9 ml 16.19 ml/m RA Volume:   22.80 ml  11.94 ml/m LA Vol (A4C):   31.7 ml 16.61 ml/m LA Biplane Vol: 31.9 ml 16.71 ml/m  AORTIC VALVE LVOT Vmax:   80.30 cm/s LVOT Vmean:  51.800 cm/s LVOT VTI:    0.136 m  AORTA Ao Root diam: 3.40 cm Ao Asc diam:  2.90 cm MITRAL VALVE               TRICUSPID VALVE MV Area (PHT): 3.99 cm    TR Peak grad:   10.8 mmHg MV Decel Time: 190 msec    TR Vmax:        164.00 cm/s MV E velocity: 53.10 cm/s MV A velocity: 56.10 cm/s  SHUNTS MV E/A ratio:  0.95        Systemic VTI:  0.14 m                             Systemic Diam: 2.10 cm Dietrich Pates MD Electronically signed by Dietrich Pates MD Signature Date/Time: 04/23/2020/2:52:48 PM    Final     Cardiac Studies   Coronary/Graft Acute MI Revascularization  LEFT HEART CATH AND CORONARY ANGIOGRAPHY    Conclusion    2nd Diag lesion is 20% stenosed.  Mid LAD to Dist LAD lesion is 100% stenosed.  Post intervention, there is a 0% residual stenosis.  Mid LAD lesion is 20% stenosed.  A stent was successfully placed.  The left ventricular ejection fraction is 25-35% by visual estimate.  LV end diastolic pressure is mildly elevated.  There is severe left ventricular systolic dysfunction.   Acute anterolateral myocardial infarction secondary to total occlusion of the LAD immediately proximal to the previously placed mid LAD stent with initial TIMI 0 flow.  No significant concomitant CAD with a normal ramus intermediate, a large dominant normal left circumflex vessel, and a normal nondominant RCA.  Successful PCI to the LAD with ultimate insertion of a 3.0 x 26 mm Resolute stent postdilated with taper from 3.2-3.43mm with residual stenosis being reduced to 0%.  There is resumption of brisk TIMI-3 flow.  There is no change in the mild 20% stenosis proximal to the second diagonal vessel and 20% ostial second diagonal stenosis.  Severe LV dysfunction with severe hypocontractility involving the mid, distal anterolateral wall extending around the apex and involving the apical inferior segment with EF estimated at approximately 25 -30% acutely.  LVEDP 19 mm.  RECOMMENDATION: DAPT for minimum of 12 months but recommend longer duration.  Guideline directed medical therapy for LV dysfunction with initiation of carvedilol, with subsequent ARB and potential transition to Entresto, aldosterone blockade and possible SGLT2 inhibition depending upon LV function recovery.  Aggressive lipid-lowering therapy with target LDL less than 70.  Smoking  cessation.   Echo: IMPRESSIONS    1. LVEF is severely depressed There is akinesis of the mid, distal and  apical LV apex   Global longitudinal strain is -8.2%. Left ventricular ejection  fraction, by estimation, is 25 to 30%. The left ventricle has severely  decreased function. Left ventricular diastolic parameters are consistent  with Grade I diastolic dysfunction  (impaired relaxation).  2. Right ventricular systolic function is normal. The right ventricular  size is normal. There is normal pulmonary artery systolic pressure.  3. The mitral valve is normal in structure. Trivial mitral valve  regurgitation.  4. The aortic valve is abnormal. Aortic valve regurgitation is not  visualized. Mild to moderate aortic valve sclerosis/calcification is  present, without any evidence of aortic stenosis.  5. The inferior vena cava is normal in size with greater than 50%  respiratory variability, suggesting right atrial pressure of 3 mmHg.    Patient Profile     55 y.o. male remote stent of LAD in 2009 presents late in course of anterior STEMI. Reports he hasn't taken any meds in 3 years. +smoker.   Assessment & Plan    1. Acute anterior STEMI. Occluded mid LAD at site of prior stent. Now s/p PCI with new DES. Significant myocardial injury with troponin > 27,000. Low EF 25-30%. Needs DAPT indefinitely with 2 layers of stent in LAD. On  beta blocker and high dose statin. Risk factor modification 2. Acute systolic CHF. EF 25-30%. Will initiate beta blocker and ARB. Add low dose aldactone.  [facility] Case management requested to assess cost.  Low sodium diet. 3. HLD. LDL 89. Goal < 70. On high dose lipitor 4. Tobacco abuse- counsel on smoking cessation. 5. NSVT. Improved but at higher risk with low EF. Should consider Lifevest at DC.    Will transfer to telemetry today. Anticipate DC tomorrow.   For questions or updates, please contact [facility] Please  consult [email or web address] for contact info under        Signed, Peter SwazilandJordan, MD  04/24/2020, 9:09 AM

## 2020-04-24 NOTE — Plan of Care (Signed)

## 2020-04-25 ENCOUNTER — Encounter (HOSPITAL_COMMUNITY): Payer: Self-pay | Admitting: Cardiovascular Disease

## 2020-04-25 ENCOUNTER — Other Ambulatory Visit: Payer: Self-pay | Admitting: Cardiovascular Disease

## 2020-04-25 DIAGNOSIS — I2102 ST elevation (STEMI) myocardial infarction involving left anterior descending coronary artery: Secondary | ICD-10-CM | POA: Diagnosis not present

## 2020-04-25 DIAGNOSIS — I5021 Acute systolic (congestive) heart failure: Secondary | ICD-10-CM | POA: Diagnosis not present

## 2020-04-25 DIAGNOSIS — E7800 Pure hypercholesterolemia, unspecified: Secondary | ICD-10-CM | POA: Diagnosis not present

## 2020-04-25 LAB — BASIC METABOLIC PANEL
Anion gap: 11 (ref 5–15)
BUN: 10 mg/dL (ref 6–20)
CO2: 22 mmol/L (ref 22–32)
Calcium: 9 mg/dL (ref 8.9–10.3)
Chloride: 106 mmol/L (ref 98–111)
Creatinine, Ser: 0.79 mg/dL (ref 0.61–1.24)
GFR, Estimated: >60 mL/min (ref >60)
Glucose, Bld: 127 mg/dL — ABNORMAL HIGH (ref 70–99)
Potassium: 3.9 mmol/L (ref 3.5–5.1)
Sodium: 139 mmol/L (ref 135–145)

## 2020-04-25 MED ORDER — TICAGRELOR 90 MG PO TABS: 90.0000 mg | ORAL_TABLET | Freq: Two times a day (BID) | ORAL | 0 refills | Status: DC

## 2020-04-25 MED ORDER — TICAGRELOR 90 MG PO TABS: 90.0000 mg | ORAL_TABLET | Freq: Two times a day (BID) | ORAL | 2 refills | Status: DC

## 2020-04-25 MED ORDER — NITROGLYCERIN 0.4 MG SL SUBL: 0.4000 mg | SUBLINGUAL_TABLET | SUBLINGUAL | 2 refills | Status: DC | PRN

## 2020-04-25 MED ORDER — ATORVASTATIN CALCIUM 80 MG PO TABS: 80.0000 mg | ORAL_TABLET | Freq: Every day | ORAL | 1 refills | Status: DC

## 2020-04-25 MED ORDER — DAPAGLIFLOZIN PROPANEDIOL 10 MG PO TABS: 10.0000 mg | ORAL_TABLET | Freq: Every day | ORAL | 1 refills | Status: DC

## 2020-04-25 MED ORDER — ASPIRIN 81 MG PO CHEW: 81.0000 mg | CHEWABLE_TABLET | Freq: Every day | ORAL | 1 refills | Status: DC

## 2020-04-25 MED ORDER — SPIRONOLACTONE 25 MG PO TABS: 12.5000 mg | ORAL_TABLET | Freq: Every day | ORAL | 0 refills | Status: DC

## 2020-04-25 MED ORDER — CARVEDILOL 3.125 MG PO TABS: 3.1250 mg | ORAL_TABLET | Freq: Two times a day (BID) | ORAL | 0 refills | Status: DC

## 2020-04-25 MED ORDER — LOSARTAN POTASSIUM 25 MG PO TABS: 25.0000 mg | ORAL_TABLET | Freq: Every day | ORAL | 0 refills | Status: DC

## 2020-04-25 MED ORDER — DAPAGLIFLOZIN PROPANEDIOL 10 MG PO TABS: 10.0000 mg | ORAL_TABLET | Freq: Every day | ORAL | 0 refills | Status: DC

## 2020-04-25 MED FILL — FARXIGA 10 MG TABLET: 10 | 30 days supply | Qty: 30 | Fill #0

## 2020-04-25 MED FILL — BRILINTA 90 MG TABLET: 90 | 30 days supply | Qty: 60 | Fill #0

## 2020-04-25 NOTE — Discharge Summary (Signed)
Discharge Summary    Patient ID: [name] MRN: [id number]; DOB: 1965/06/12  Admit date: 04/22/2020 Discharge date: 04/25/2020  PCP:  Georganna Skeans, MD   [facility]  Cardiologist:  Nicki Guadalajara, MD (new to follow up in [place]) Advanced Practice Provider:  No care team member to display Electrophysiologist:  None   Discharge Diagnoses    Principal Problem:   STEMI (ST elevation myocardial infarction) [facility]) Active Problems:   Hypercholesterolemia   History of tobacco abuse   STEMI involving left anterior descending coronary artery (HCC)   Acute systolic CHF (congestive heart failure) [facility])  Diagnostic Studies/Procedures    Cath: 04/22/20   2nd Diag lesion is 20% stenosed.  Mid LAD to Dist LAD lesion is 100% stenosed.  Post intervention, there is a 0% residual stenosis.  Mid LAD lesion is 20% stenosed.  A stent was successfully placed.  The left ventricular ejection fraction is 25-35% by visual estimate.  LV end diastolic pressure is mildly elevated.  There is severe left ventricular systolic dysfunction.   Acute anterolateral myocardial infarction secondary to total occlusion of the LAD immediately proximal to the previously placed mid LAD stent with initial TIMI 0 flow.  No significant concomitant CAD with a normal ramus intermediate, a large dominant normal left circumflex vessel, and a normal nondominant RCA.  Successful PCI to the LAD with ultimate insertion of a 3.0 x 26 mm Resolute stent postdilated with taper from 3.2-3.57mm with residual stenosis being reduced to 0%.  There is resumption of brisk TIMI-3 flow.  There is no change in the mild 20% stenosis proximal to the second diagonal vessel and 20% ostial second diagonal stenosis.  Severe LV dysfunction with severe hypocontractility involving the mid, distal anterolateral wall extending around the apex and involving the apical inferior segment with EF estimated at approximately  25 -30% acutely.  LVEDP 19 mm.  RECOMMENDATION: DAPT for minimum of 12 months but recommend longer duration.  Guideline directed medical therapy for LV dysfunction with initiation of carvedilol, with subsequent ARB and potential transition to Entresto, aldosterone blockade and possible SGLT2 inhibition depending upon LV function recovery.  Aggressive lipid-lowering therapy with target LDL less than 70.  Smoking cessation.  Diagnostic Dominance: Left    Intervention     Echo: 04/23/20  IMPRESSIONS    1. LVEF is severely depressed There is akinesis of the mid, distal and  apical LV apex   Global longitudinal strain is -8.2%. Left ventricular ejection  fraction, by estimation, is 25 to 30%. The left ventricle has severely  decreased function. Left ventricular diastolic parameters are consistent  with Grade I diastolic dysfunction  (impaired relaxation).  2. Right ventricular systolic function is normal. The right ventricular  size is normal. There is normal pulmonary artery systolic pressure.  3. The mitral valve is normal in structure. Trivial mitral valve  regurgitation.  4. The aortic valve is abnormal. Aortic valve regurgitation is not  visualized. Mild to moderate aortic valve sclerosis/calcification is  present, without any evidence of aortic stenosis.  5. The inferior vena cava is normal in size with greater than 50%  respiratory variability, suggesting right atrial pressure of 3 mmHg.  _____________   History of Present Illness     [name] is a 55 y.o. male with PMH of CAD with DES to LAD '09, polysubstance abuse, smoker, HLD who presented to [facility] with chest pain. He described the pain as a ripping and tearing pain.  He also had  nausea and vomiting.  He thought that the symptoms were GI in nature and took various GI medications without improvement.   Finally, he went to [facility] the following morning. He came by EMS who gave him  sublingual nitroglycerin x2 and a 250 cc bolus.  He was still having the pain, it was still severe rated 9/10.  He had labs drawn including a troponin, a CMET, and INR, a CBC with differential, and a urinalysis.  He also had a 6-hour Covid swab done.  In the ER, he was given ASA 324 mg, 4 mg of Zofran, 4 mg of morphine, 4000 units of heparin and 10 mg of Lipitor.  His ECG was felt to represent a STEMI and he was transported emergently to [facility].  He was swabbed for Covid and taken to the Cath Lab.  Hospital Course     1. Anterior STEMI: hsTn >27000. Underwent cardiac cath noted above with occluded mLAD (prior stent), s/p PCI with DESx1. Placed on DAPT with ASA/Brilinta for at least one year, but ideally indefinitely given double layer of stenting. No recurrent chest pain. Worked well with cardiac rehab. If cost of Brilinta is an issue, will need to transition to plavix. -- ASA, Brilinta, BB, ARB, spiro and statin  2. Acute systolic CHF: EF noted at 25-30% G1DD with akinesis of the m/d and apical LV apex. No evidence of volume overload during admission.  -- started on coreg 3.125mg  BID, losartan 12.5mg  daily and spiro 12.5mg  daily -- recommended for Lifevest which was ordered and fitted prior to discharge -- unable to transition to [facility] prior to discharge as blood pressures remain stable but soft -- added Farxiga prior to discharge  3. HLD: LDL 89 -- started on atorvastatin 80mg  daily -- FLP/LFTs in 8 weeks  4. NSVT: improved throughout admission with addition of low dose coreg 3.125mg  BID -- fitted for Lifevest as above  5. Tobacco use: counseled on cessation during admission  General: Well developed, well nourished, male appearing in no acute distress. Head: Normocephalic, atraumatic.  Neck: Supple without bruits, JVD. Lungs:  Resp regular and unlabored, CTA. Heart: RRR, S1, S2, no S3, S4, or murmur; no rub. Abdomen: Soft, non-tender, non-distended with normoactive bowel  sounds. No hepatomegaly. No rebound/guarding. No obvious abdominal masses. Extremities: No clubbing, cyanosis, edema. Distal pedal pulses are 2+ bilaterally. Right radial cath site stable without bruising or hematoma Neuro: Alert and oriented X 3. Moves all extremities spontaneously. Psych: Normal affect.   Did the patient have an acute coronary syndrome (MI, NSTEMI, STEMI, etc) this admission?:  Yes                               AHA/ACC Clinical Performance & Quality Measures: 1. Aspirin prescribed? - Yes 2. ADP Receptor Inhibitor (Plavix/Clopidogrel, Brilinta/Ticagrelor or Effient/Prasugrel) prescribed (includes medically managed patients)? - Yes 3. Beta Blocker prescribed? - Yes 4. High Intensity Statin (Lipitor 40-80mg  or Crestor 20-40mg ) prescribed? - Yes 5. EF assessed during THIS hospitalization? - Yes 6. For EF <40%, was ACEI/ARB prescribed? - Yes 7. For EF <40%, Aldosterone Antagonist (Spironolactone or Eplerenone) prescribed? - Yes 8. Cardiac Rehab Phase II ordered (including medically managed patients)? - Yes       _____________  Discharge Vitals Blood pressure 104/74, pulse 72, temperature 97.8 F (36.6 C), temperature source Oral, resp. rate 16, height 5\' 11"  (1.803 m), weight 71.3 kg, SpO2 97 %.  Filed Weights  04/23/20 0530 04/24/20 0400 04/25/20 0359  Weight: 71.8 kg 72.8 kg 71.3 kg    Labs & Radiologic Studies    CBC Recent Labs    04/22/20 1304 04/22/20 1355 04/22/20 1534 04/23/20 0056  WBC 15.0*  --  11.5* 11.6*  NEUTROABS 12.8*  --   --   --   HGB 13.4   < > 11.8* 12.5*  HCT 40.3   < > 34.6* 37.9*  MCV 86.5  --  85.6 86.9  PLT 297  --  247 256   < > = values in this interval not displayed.   Basic Metabolic Panel Recent Labs    16/12/9600/22/22 0057 04/25/20 0250  NA 139 139  K 3.7 3.9  CL 109 106  CO2 20* 22  GLUCOSE 135* 127*  BUN 9 10  CREATININE 0.80 0.79  CALCIUM 8.7* 9.0   Liver Function Tests Recent Labs    04/22/20 1304  AST 66*   ALT 27  ALKPHOS 55  BILITOT 1.2  PROT 6.3*  ALBUMIN 3.8   Recent Labs    04/22/20 1304  LIPASE 33   High Sensitivity Troponin:   Recent Labs  Lab 04/22/20 1304 04/22/20 1534 04/22/20 1747  TROPONINIHS 5,228* >27,000* >27,000*    BNP Invalid input(s): POCBNP D-Dimer No results for input(s): DDIMER in the last 72 hours. Hemoglobin A1C Recent Labs    04/24/20 0057  HGBA1C 5.4   Fasting Lipid Panel Recent Labs    04/23/20 1010  CHOL 141  HDL 36*  LDLCALC 89  TRIG 79  CHOLHDL 3.9   Thyroid Function Tests No results for input(s): TSH, T4TOTAL, T3FREE, THYROIDAB in the last 72 hours.  Invalid input(s): FREET3 _____________  CARDIAC CATHETERIZATION  Result Date: 04/22/2020  2nd Diag lesion is 20% stenosed.  Mid LAD to Dist LAD lesion is 100% stenosed.  Post intervention, there is a 0% residual stenosis.  Mid LAD lesion is 20% stenosed.  A stent was successfully placed.  The left ventricular ejection fraction is 25-35% by visual estimate.  LV end diastolic pressure is mildly elevated.  There is severe left ventricular systolic dysfunction.  Acute anterolateral myocardial infarction secondary to total occlusion of the LAD immediately proximal to the previously placed mid LAD stent with initial TIMI 0 flow. No significant concomitant CAD with a normal ramus intermediate, a large dominant normal left circumflex vessel, and a normal nondominant RCA. Successful PCI to the LAD with ultimate insertion of a 3.0 x 26 mm Resolute stent postdilated with taper from 3.2-3.451mm with residual stenosis being reduced to 0%.  There is resumption of brisk TIMI-3 flow.  There is no change in the mild 20% stenosis proximal to the second diagonal vessel and 20% ostial second diagonal stenosis. Severe LV dysfunction with severe hypocontractility involving the mid, distal anterolateral wall extending around the apex and involving the apical inferior segment with EF estimated at approximately 25  -30% acutely.  LVEDP 19 mm. RECOMMENDATION: DAPT for minimum of 12 months but recommend longer duration.  Guideline directed medical therapy for LV dysfunction with initiation of carvedilol, with subsequent ARB and potential transition to Entresto, aldosterone blockade and possible SGLT2 inhibition depending upon LV function recovery.  Aggressive lipid-lowering therapy with target LDL less than 70.  Smoking cessation.  ECHOCARDIOGRAM COMPLETE  Result Date: 04/23/2020    ECHOCARDIOGRAM REPORT   Patient Name:   [name] Date of Exam: 04/23/2020 Medical Rec #:  [id number]       Height:  71.0 in Accession #:    [id number]      Weight:       158.3 lb Date of Birth:  12/12/1965        BSA:          1.909 m Patient Age:    54 years        BP:           101/67 mmHg Patient Gender: M               HR:           77 bpm. Exam Location:  Inpatient Procedure: 2D Echo, Cardiac Doppler, Color Doppler and Intracardiac            Opacification Agent Indications:    Acute myocardial infarction, unspecified I21.9  History:        Patient has no prior history of Echocardiogram examinations.                 CHF, CAD, Arrythmias:NSVT; Risk Factors:Current Smoker and                 Dyslipidemia.  Sonographer:    Leta Jungling RDCS Referring Phys: [phone] THOMAS A KELLY IMPRESSIONS  1. LVEF is severely depressed There is akinesis of the mid, distal and apical LV apex     Global longitudinal strain is -8.2%. Left ventricular ejection fraction, by estimation, is 25 to 30%. The left ventricle has severely decreased function. Left ventricular diastolic parameters are consistent with Grade I diastolic dysfunction (impaired relaxation).  2. Right ventricular systolic function is normal. The right ventricular size is normal. There is normal pulmonary artery systolic pressure.  3. The mitral valve is normal in structure. Trivial mitral valve regurgitation.  4. The aortic valve is abnormal. Aortic valve regurgitation is not visualized.  Mild to moderate aortic valve sclerosis/calcification is present, without any evidence of aortic stenosis.  5. The inferior vena cava is normal in size with greater than 50% respiratory variability, suggesting right atrial pressure of 3 mmHg. FINDINGS  Left Ventricle: LVEF is severely depressed There is akinesis of the mid, distal and apical LV apex Global longitudinal strain is -8.2%. Left ventricular ejection fraction, by estimation, is 25 to 30%. The left ventricle has severely decreased function. Definity contrast agent was given IV to delineate the left ventricular endocardial borders. The left  ventricular internal cavity size was normal in size. There is no left ventricular hypertrophy. Left ventricular diastolic parameters are consistent with Grade I diastolic dysfunction (impaired relaxation). Right Ventricle: The right ventricular size is normal. Right vetricular wall thickness was not assessed. Right ventricular systolic function is normal. There is normal pulmonary artery systolic pressure. The tricuspid regurgitant velocity is 1.64 m/s, and with an assumed right atrial pressure of 3 mmHg, the estimated right ventricular systolic pressure is 13.8 mmHg. Left Atrium: Left atrial size was normal in size. Right Atrium: Right atrial size was normal in size. Pericardium: There is no evidence of pericardial effusion. Mitral Valve: The mitral valve is normal in structure. Trivial mitral valve regurgitation. Tricuspid Valve: The tricuspid valve is normal in structure. Tricuspid valve regurgitation is trivial. Aortic Valve: The aortic valve is abnormal. Aortic valve regurgitation is not visualized. Mild to moderate aortic valve sclerosis/calcification is present, without any evidence of aortic stenosis. Pulmonic Valve: The pulmonic valve was grossly normal. Pulmonic valve regurgitation is not visualized. Aorta: The aortic root and ascending aorta are structurally normal, with no evidence of dilitation. Venous: The  inferior vena cava is normal in size with greater than 50% respiratory variability, suggesting right atrial pressure of 3 mmHg. IAS/Shunts: The interatrial septum was not assessed.  LEFT VENTRICLE PLAX 2D LVIDd:         5.00 cm      Diastology LVIDs:         4.00 cm      LV e' medial:    5.51 cm/s LV PW:         1.00 cm      LV E/e' medial:  9.6 LV IVS:        1.00 cm      LV e' lateral:   4.22 cm/s LVOT diam:     2.10 cm      LV E/e' lateral: 12.6 LV SV:         47 LV SV Index:   25 LVOT Area:     3.46 cm  LV Volumes (MOD) LV vol d, MOD A2C: 165.0 ml LV vol d, MOD A4C: 147.0 ml LV vol s, MOD A2C: 102.0 ml LV vol s, MOD A4C: 89.5 ml LV SV MOD A2C:     63.0 ml LV SV MOD A4C:     147.0 ml LV SV MOD BP:      59.1 ml RIGHT VENTRICLE RV S prime:     11.20 cm/s TAPSE (M-mode): 1.5 cm LEFT ATRIUM             Index       RIGHT ATRIUM           Index LA diam:        2.90 cm 1.52 cm/m  RA Area:     10.80 cm LA Vol (A2C):   30.9 ml 16.19 ml/m RA Volume:   22.80 ml  11.94 ml/m LA Vol (A4C):   31.7 ml 16.61 ml/m LA Biplane Vol: 31.9 ml 16.71 ml/m  AORTIC VALVE LVOT Vmax:   80.30 cm/s LVOT Vmean:  51.800 cm/s LVOT VTI:    0.136 m  AORTA Ao Root diam: 3.40 cm Ao Asc diam:  2.90 cm MITRAL VALVE               TRICUSPID VALVE MV Area (PHT): 3.99 cm    TR Peak grad:   10.8 mmHg MV Decel Time: 190 msec    TR Vmax:        164.00 cm/s MV E velocity: 53.10 cm/s MV A velocity: 56.10 cm/s  SHUNTS MV E/A ratio:  0.95        Systemic VTI:  0.14 m                            Systemic Diam: 2.10 cm Dietrich Pates MD Electronically signed by Dietrich Pates MD Signature Date/Time: 04/23/2020/2:52:48 PM    Final    Disposition   Pt is being discharged home today in good condition.  Follow-up Plans & Appointments     Follow-up Information    Jonelle Sidle, MD Follow up on 06/05/2020.   Specialty: Cardiology Why: 10:30pm for your follow up appt Contact information: [address] [place] [address] [phone]         [facility]. Go on 04/27/2020.   Specialty: Cardiology Why: at 11AM, this is a 1 hour appt. You will see a HF provider, Pharmacist and Child psychotherapist.  Bring your red bag with all  medications and pill box to appt. Use FREE valet parking at [employer] information: [address] [id number] mc [place] [address] [phone]             Discharge Instructions    (HEART FAILURE PATIENTS) Call MD:  Anytime you have any of the following symptoms: 1) 3 pound weight gain in 24 hours or 5 pounds in 1 week 2) shortness of breath, with or without a dry hacking cough 3) swelling in the hands, feet or stomach 4) if you have to sleep on extra pillows at night in order to breathe.   Complete by: As directed    Amb Referral to Cardiac Rehabilitation   Complete by: As directed    Diagnosis:  Coronary Stents STEMI PTCA     After initial evaluation and assessments completed: Virtual Based Care may be provided alone or in conjunction with Phase 2 Cardiac Rehab based on patient barriers.: Yes   Call MD for:  difficulty breathing, headache or visual disturbances   Complete by: As directed    Call MD for:  persistant dizziness or light-headedness   Complete by: As directed    Call MD for:  redness, tenderness, or signs of infection (pain, swelling, redness, odor or green/yellow discharge around incision site)   Complete by: As directed    Diet - low sodium heart healthy   Complete by: As directed    Discharge instructions   Complete by: As directed    Radial Site Care Refer to this sheet in the next few weeks. These instructions provide you with information on caring for yourself after your procedure. Your caregiver may also give you more specific instructions. Your treatment has been planned according to current medical practices, but problems sometimes occur. Call your caregiver if you have any problems or questions after your  procedure. HOME CARE INSTRUCTIONS You may shower the day after the procedure.Remove the bandage (dressing) and gently wash the site with plain soap and water.Gently pat the site dry.  Do not apply powder or lotion to the site.  Do not submerge the affected site in water for 3 to 5 days.  Inspect the site at least twice daily.  Do not flex or bend the affected arm for 24 hours.  No lifting over 5 pounds (2.3 kg) for 5 days after your procedure.  Do not drive home if you are discharged the same day of the procedure. Have someone else drive you.  You may drive 24 hours after the procedure unless otherwise instructed by your caregiver.  What to expect: Any bruising will usually fade within 1 to 2 weeks.  Blood that collects in the tissue (hematoma) may be painful to the touch. It should usually decrease in size and tenderness within 1 to 2 weeks.  SEEK IMMEDIATE MEDICAL CARE IF: You have unusual pain at the radial site.  You have redness, warmth, swelling, or pain at the radial site.  You have drainage (other than a small amount of blood on the dressing).  You have chills.  You have a fever or persistent symptoms for more than 72 hours.  You have a fever and your symptoms suddenly get worse.  Your arm becomes pale, cool, tingly, or numb.  You have heavy bleeding from the site. Hold pressure on the site.   PLEASE DO NOT MISS ANY DOSES OF YOUR BRILINTA!!!!! Also keep a log of you blood pressures and bring back to your follow up appt. Please call  the office with any questions.   Patients taking blood thinners should generally stay away from medicines like ibuprofen, Advil, Motrin, naproxen, and Aleve due to risk of stomach bleeding. You may take Tylenol as directed or talk to your primary doctor about alternatives.   PLEASE ENSURE THAT YOU DO NOT RUN OUT OF YOUR BRILINTA.This medication is very important to remain on for at least one year. IF you have issues obtaining this medication due to  cost please CALL the office 3-5 business days prior to running out in order to prevent missing doses of this medication.   Patient was instructed regarding no driving until cleared by his cardiologist given the need for lifevest.   Increase activity slowly   Complete by: As directed      Discharge Medications   Allergies as of 04/25/2020   No Known Allergies     Medication List    TAKE these medications   aspirin 81 MG chewable tablet Chew 1 tablet (81 mg total) by mouth daily.   atorvastatin 80 MG tablet Commonly known as: LIPITOR Take 1 tablet (80 mg total) by mouth daily.   carvedilol 3.125 MG tablet Commonly known as: COREG Take 1 tablet (3.125 mg total) by mouth 2 (two) times daily with a meal.   dapagliflozin propanediol 10 MG Tabs tablet Commonly known as: FARXIGA Take 1 tablet (10 mg total) by mouth daily before breakfast.   losartan 25 MG tablet Commonly known as: COZAAR Take 1 tablet (25 mg total) by mouth daily.   nitroGLYCERIN 0.4 MG SL tablet Commonly known as: Nitrostat Place 1 tablet (0.4 mg total) under the tongue every 5 (five) minutes as needed.   spironolactone 25 MG tablet Commonly known as: ALDACTONE Take 0.5 tablets (12.5 mg total) by mouth daily.   ticagrelor 90 MG Tabs tablet Commonly known as: BRILINTA Take 1 tablet (90 mg total) by mouth 2 (two) times daily.            Durable Medical Equipment  (From admission, onward)         Start     Ordered   04/24/20 1323  For home use only DME Vest life vest  Once       Comments: Start date 04/24/20 Length of need 3 months VT 150j VF 200j   04/24/20 1324          Outstanding Labs/Studies   FLP/LFTs in 8 weeks  Duration of Discharge Encounter   Greater than 30 minutes including physician time.  Signed, Laverda Page, NP 04/25/2020, 11:26 AM

## 2020-04-25 NOTE — Progress Notes (Signed)
Came to ambulate and took vitals however Lifevest rep came. Encouraged pt to ambulate independently after rep is done. Brief review of low sodium, smoking cessation and ambulating at home. Pt voiced understanding. [id number] Ethelda Chick CES, ACSM 10:23 AM 04/25/2020

## 2020-04-25 NOTE — Discharge Instructions (Signed)
Radial Site Care  This sheet gives you information about how to care for yourself after your procedure. Your health care provider may also give you more specific instructions. If you have problems or questions, contact your health care provider. What can I expect after the procedure? After the procedure, it is common to have:  Bruising and tenderness at the catheter insertion area. Follow these instructions at home: Medicines  Take over-the-counter and prescription medicines only as told by your health care provider. Insertion site care  Follow instructions from your health care provider about how to take care of your insertion site. Make sure you: ? Wash your hands with soap and water before you change your bandage (dressing). If soap and water are not available, use hand sanitizer. ? Change your dressing as told by your health care provider. ? Leave stitches (sutures), skin glue, or adhesive strips in place. These skin closures may need to stay in place for 2 weeks or longer. If adhesive strip edges start to loosen and curl up, you may trim the loose edges. Do not remove adhesive strips completely unless your health care provider tells you to do that.  Check your insertion site every day for signs of infection. Check for: ? Redness, swelling, or pain. ? Fluid or blood. ? Pus or a bad smell. ? Warmth.  Do not take baths, swim, or use a hot tub until your health care provider approves.  You may shower 24-48 hours after the procedure, or as directed by your health care provider. ? Remove the dressing and gently wash the site with plain soap and water. ? Pat the area dry with a clean towel. ? Do not rub the site. That could cause bleeding.  Do not apply powder or lotion to the site. Activity  For 24 hours after the procedure, or as directed by your health care provider: ? Do not flex or bend the affected arm. ? Do not push or pull heavy objects with the affected arm. ? Do not drive  yourself home from the hospital or clinic. You may drive 24 hours after the procedure unless your health care provider tells you not to. ? Do not operate machinery or power tools.  Do not lift anything that is heavier than 10 lb (4.5 kg), or the limit that you are told, until your health care provider says that it is safe.  Ask your health care provider when it is okay to: ? Return to work or school. ? Resume usual physical activities or sports. ? Resume sexual activity.   General instructions  If the catheter site starts to bleed, raise your arm and put firm pressure on the site. If the bleeding does not stop, get help right away. This is a medical emergency.  If you went home on the same day as your procedure, a responsible adult should be with you for the first 24 hours after you arrive home.  Keep all follow-up visits as told by your health care provider. This is important. Contact a health care provider if:  You have a fever.  You have redness, swelling, or yellow drainage around your insertion site. Get help right away if:  You have unusual pain at the radial site.  The catheter insertion area swells very fast.  The insertion area is bleeding, and the bleeding does not stop when you hold steady pressure on the area.  Your arm or hand becomes pale, cool, tingly, or numb. These symptoms may represent a serious   problem that is an emergency. Do not wait to see if the symptoms will go away. Get medical help right away. Call your local emergency services (911 in the [place] Do not drive yourself to the hospital. Summary  After the procedure, it is common to have bruising and tenderness at the site.  Follow instructions from your health care provider about how to take care of your radial site wound. Check the wound every day for signs of infection.  Do not lift anything that is heavier than 10 lb (4.5 kg), or the limit that you are told, until your health care provider says that it  is safe. This information is not intended to replace advice given to you by your health care provider. Make sure you discuss any questions you have with your health care provider. Document Revised: 03/25/2017 Document Reviewed: 03/25/2017 Elsevier Patient Education  2021 Elsevier Inc.  

## 2020-04-26 NOTE — Progress Notes (Signed)
[facility] Transitions [facility] Care [facility]  PCP: No PCP Primary Cardiologist: Nicki Guadalajara  HPI:  [name] is a 55 y.o.  male  with a PMH significant for CAD/ s/p PCI 2009 (in [place] LAD), Tobacco use, polysubstance use, Chronic Systolic CHF   First admitted to [facility] in 2012 for typical chest pain, he reported fairly recent methamphetamine use w/in 3wks and heavy smoking around 3ppd at that time.  Cardiac enzymes were negative x3 and ecg not consistent with ACS. He was placed on nitro drip had myovue scan which was low risk and with estimated EF of 48%.  ECHO also with EF of 50% and no other abnormalities.    He was discharged to a drug rehab program.    Had another admission later that year for similar symptoms this time had a LHC which demonstrated patent LAD stent(from [place]). No significant CAD.  Ejection fraction 50%.  Admitted for further chest pain in 2013 with negative enzymes, continued on medication therapy and discharged.  Was doing okay until 04/2020 where he had severe substernal chest pain and was found to have an Acute Anterolateral STEMI secondary to total occlusion of the LAD immediately proximal to the previously placed mid LAD stent with initial TIMI 0 flow.  He received a Successful PCI to the LAD with brisk timi 3 flow afterward.  Unfortunately was found to have Severe LV dysfunction with severe hypocontractility involving the mid, distal anterolateral wall extending around the apex and involving the apical inferior segment with EF estimated at approximately 25 -30% acutely. LVEDP 19 mm.  ECHO also reflected an EF of 25-30% with RWMA,  normal valves, normal RV function.    He has been dyspneic occasionally, hasn't really exerted himself.  Has only been out of the hospital for a few days.  The walk from the parking lot to here he had some mild dyspnea but did not need to rest.  He denies any chest discomfort.  Lays flat when he sleeps, no PND.   Weighing himself daily, weighs 153 without the vest 158 which has been stable for the last few days.    ROS: All systems negative except as listed in HPI, PMH and Problem List.  SH:  Social History   Socioeconomic History  . Marital status: Married    Spouse name: Dorian Duval  . Number of children: Not on file  . Years of education: Not on file  . Highest education level: Not on file  Occupational History  . Not on file  Tobacco Use  . Smoking status: Former Smoker    Packs/day: 0.50    Years: 41.00    Pack years: 20.50    Types: Cigarettes    Quit date: 09/01/2010    Years since quitting: 9.6  . Smokeless tobacco: Never Used  . Tobacco comment: smoked up to 3 ppd x 41 yrs  Vaping Use  . Vaping Use: Never used  Substance and Sexual Activity  . Alcohol use: No  . Drug use: Not Currently    Types: Methamphetamines    Comment: previously used methamphetamines - quit 09/2010  . Sexual activity: Yes    Partners: Female    Birth control/protection: None  Other Topics Concern  . Not on file  Social History Narrative   Lives in [place].  Step-dtr near-by.  Works for a [employer].   Social Determinants of Health   Financial Resource Strain: High Risk  . Difficulty of Paying Living Expenses:  Hard  Food Insecurity: Food Insecurity Present  . Worried About Programme researcher, broadcasting/film/video in the Last Year: Sometimes true  . Ran Out of Food in the Last Year: Sometimes true  Transportation Needs: No Transportation Needs  . Lack of Transportation (Medical): No  . Lack of Transportation (Non-Medical): No  Physical Activity: Insufficiently Active  . Days of Exercise per Week: 3 days  . Minutes of Exercise per Session: 20 min  Stress: Not on file  Social Connections: Not on file  Intimate Partner Violence: Not At Risk  . Fear of Current or Ex-Partner: No  . Emotionally Abused: No  . Physically Abused: No  . Sexually Abused: No    FH:  Family History  Problem Relation Age of Onset   . Heart attack Father        died @ 83  . Coronary artery disease Mother        alive in late 43's    Past Medical History:  Diagnosis Date  . Coronary arteriosclerosis    a. PCI LAD 2009 in [place];  b. 12/2010 Cath: LM: nl, LAD: patent stent mid, 25 mid, LCX nl, RCA: non dominant, nl, EF 50%  . History of methamphetamine abuse (HCC)    a. Used x 6 yrs, quit July 2012  . History of tobacco abuse    a. 41 yrs, up to 3 ppd, quit July 2012  . Hypercholesterolemia     Current Outpatient Medications  Medication Sig Dispense Refill  . aspirin 81 MG chewable tablet Chew 1 tablet (81 mg total) by mouth daily. 90 tablet 1  . atorvastatin (LIPITOR) 80 MG tablet Take 1 tablet (80 mg total) by mouth daily. 90 tablet 1  . carvedilol (COREG) 3.125 MG tablet Take 1 tablet (3.125 mg total) by mouth 2 (two) times daily with a meal. 60 tablet 0  . dapagliflozin propanediol (FARXIGA) 10 MG TABS tablet Take 1 tablet (10 mg total) by mouth daily before breakfast. 30 tablet 0  . losartan (COZAAR) 25 MG tablet Take 1 tablet (25 mg total) by mouth daily. 90 tablet 0  . nitroGLYCERIN (NITROSTAT) 0.4 MG SL tablet Place 1 tablet (0.4 mg total) under the tongue every 5 (five) minutes as needed. 25 tablet 2  . spironolactone (ALDACTONE) 25 MG tablet Take 0.5 tablets (12.5 mg total) by mouth daily. 30 tablet 0  . ticagrelor (BRILINTA) 90 MG TABS tablet Take 1 tablet (90 mg total) by mouth 2 (two) times daily. 60 tablet 0   No current facility-administered medications for this encounter.    Vitals:   04/27/20 1105  BP: 90/60  Pulse: 67  SpO2: 98%  Weight: 73.7 kg (162 lb 6.4 oz)    PHYSICAL EXAM: Cardiac: JVD flat, normal rate and rhythm, clear s1 and s2, no murmurs, rubs or gallops, no LE edema Pulmonary: CTAB, not in distress Abdominal: non distended abdomen, soft and nontender Psych: Alert, conversant, in good spirits    ECG   ASSESSMENT & PLAN: Chronic Systolic CHF, Ischemic  Cardiomyopathy: -CAD/ s/p PCI 2009 (in [place] LAD) -2012 EF of 50% and no other abnormalities -2012  Repeat LHC patent LAD stent(from [place] 2009). No significant CAD -04/2020 Acute Anterolateral STEMI with successful PCI just prox to existing patent LAD stent -04/2020 ECHO with EF of 25-30% with RWMA,  normal valves, normal RV function. -NYHA Class II symptoms -continue lifevest, needs repeat ECHO 3 months -on good GDMT continue carvedilol, losartan, spiro, farxiga no room in bp or  heart rate for titration today his bp is low but he is not symptomatic, likely won't tolerate Entresto -given recent addition of meds including spiro, pt will come back for lab visit next week -recommend referral to cardiac rehab, he could really benefit from this  CAD: -continue asa, brilinta, carvedilol  Tobacco Use: -has had one cigarette since admission  -has only been successful going cold [place] has failed gum, patches, vaping and chantix  Polysubstance Use: -has not used any recreational drugs since 2012  SDOH:  -financial assessment and assistance provided today  Referral for PCP  Follow up with [facility]

## 2020-04-27 ENCOUNTER — Ambulatory Visit (HOSPITAL_COMMUNITY)
Admit: 2020-04-27 | Discharge: 2020-04-27 | Disposition: A | Discharge status: Home / Self Care | Payer: BC Managed Care – PPO | Attending: Internal Medicine | Admitting: Internal Medicine

## 2020-04-27 ENCOUNTER — Other Ambulatory Visit: Payer: Self-pay

## 2020-04-27 ENCOUNTER — Encounter (HOSPITAL_COMMUNITY): Payer: Self-pay

## 2020-04-27 ENCOUNTER — Telehealth (HOSPITAL_COMMUNITY): Payer: Self-pay

## 2020-04-27 VITALS — BP 90/60 | HR 67 | Wt 162.4 lb

## 2020-04-27 DIAGNOSIS — I5022 Chronic systolic (congestive) heart failure: Secondary | ICD-10-CM

## 2020-04-27 DIAGNOSIS — Z79899 Other long term (current) drug therapy: Secondary | ICD-10-CM | POA: Diagnosis not present

## 2020-04-27 DIAGNOSIS — E7800 Pure hypercholesterolemia, unspecified: Secondary | ICD-10-CM | POA: Diagnosis not present

## 2020-04-27 DIAGNOSIS — I2510 Atherosclerotic heart disease of native coronary artery without angina pectoris: Secondary | ICD-10-CM | POA: Diagnosis not present

## 2020-04-27 DIAGNOSIS — Z7982 Long term (current) use of aspirin: Secondary | ICD-10-CM | POA: Diagnosis not present

## 2020-04-27 DIAGNOSIS — Z955 Presence of coronary angioplasty implant and graft: Secondary | ICD-10-CM | POA: Insufficient documentation

## 2020-04-27 DIAGNOSIS — Z87891 Personal history of nicotine dependence: Secondary | ICD-10-CM | POA: Diagnosis not present

## 2020-04-27 DIAGNOSIS — F1511 Other stimulant abuse, in remission: Secondary | ICD-10-CM

## 2020-04-27 DIAGNOSIS — I255 Ischemic cardiomyopathy: Secondary | ICD-10-CM | POA: Insufficient documentation

## 2020-04-27 DIAGNOSIS — Z7984 Long term (current) use of oral hypoglycemic drugs: Secondary | ICD-10-CM | POA: Diagnosis not present

## 2020-04-27 NOTE — Progress Notes (Signed)
Heart and Vascular Care Navigation  04/27/2020  [name] [birth year] [id number]  Reason for Referral: Patient seen in [facility] clinic.                                                                                                    Assessment: Patient lives with his disabled wife in a single family home.  He reports that he has been working at [employer] and due to recent hospitalization is out on leave. Patient does not have any short term disability benefit and concerned about finances. He states that "things will get tight" for food and other household needs as the only income will be his wife's disability. Patient reports he was bale to get his medications and has transportation to and from medical appointments.                                     HRT/VAS Care Coordination     Patients Home Cardiology Office --  [facility]   Outpatient Care Team Social Worker   Social Worker Name: Lasandra Beech, [place] [phone]   Living arrangements for the past 2 months Single Family Home   Lives with: Spouse   Patient Current Insurance Coverage Commercial Insurance   Patient Has Concern With Paying Medical Bills Yes   Does Patient Have Prescription Coverage? Yes   Home Assistive Devices/Equipment None       Social History:                                                                             SDOH Screenings   Alcohol Screen: Low Risk   . Last Alcohol Screening Score (AUDIT): 0  Depression (PHQ2-9): Not on file  Financial Resource Strain: High Risk  . Difficulty of Paying Living Expenses: Very hard  Food Insecurity: Food Insecurity Present  . Worried About Programme researcher, broadcasting/film/video in the Last Year: Sometimes true  . Ran Out of Food in the Last Year: Sometimes true  Housing: Medium Risk  . Last Housing Risk Score: 1  Physical Activity: Insufficiently Active  . Days of Exercise per Week: 3 days  . Minutes of Exercise per Session: 20 min  Social Connections: Not on file  Stress: Not  on file  Tobacco Use: Medium Risk  . Smoking Tobacco Use: Former Smoker  . Smokeless Tobacco Use: Never Used  Transportation Needs: No Transportation Needs  . Lack of Transportation (Medical): No  . Lack of Transportation (Non-Medical): No    SDOH Interventions: Financial Resources:  Financial Strain Interventions: Other (Comment) (Community resources and Patient Care Fund) Referr to Patient Care Fund to assist with mortgage payment.  Food Insecurity:  Food Insecurity Interventions: Assist with [employer] Application  Housing Insecurity:   n/a  Transportation:    n/a    Follow-up plan: CSW discussed options in the community for assistance with finances. Patient will apply for food stamps at [facility] as he is out of work and wife disabled. Patient will return to CSW a copy of mortgage statement to explore assistance and was provided a [employer] card to cover gas/food expenses.  CSW will follow up with mortgage assistance when patient returns with paperwork. Patient also provided a PCP list and will follow up with obtaining an appointment. CSW available as needed. Lasandra Beech, LCSW, CCSW-MCS [phone]

## 2020-04-27 NOTE — Telephone Encounter (Signed)
Confirmed [facility] TOC clinic appt at 11 today. Gave directions, instructed to bring all medications with patient. Pt wearing life vest, charged.   Confirmed appt before ending call.   Ozella Rocks, RN, BSN Heart Failure Nurse Navigator [phone]

## 2020-04-27 NOTE — Patient Instructions (Addendum)
No Labs done today.   No medication changes were made. Please continue all current medications as prescribed.  Your physician recommends that you schedule a follow-up appointment in: 1 week for a lab only appointment at [facility] in [place] and in 2-3 weeks for an appointment with our Heart Failure Clinic. (same location you were seen today.)   If you have any questions or concerns before your next appointment please send [place] a message through [place] or call our office at [phone].    TO LEAVE A MESSAGE FOR THE NURSE SELECT OPTION 2, PLEASE LEAVE A MESSAGE INCLUDING: . YOUR NAME . DATE OF BIRTH . CALL BACK NUMBER . REASON FOR CALL**this is important as we prioritize the call backs  YOU WILL RECEIVE A CALL BACK THE SAME DAY AS LONG AS YOU CALL BEFORE 4:00 PM   Do the following things EVERYDAY: 1) Weigh yourself in the morning before breakfast. Write it down and keep it in a log. 2) Take your medicines as prescribed 3) Eat low salt foods-Limit salt (sodium) to 2000 mg per day.  4) Stay as active as you can everyday 5) Limit all fluids for the day to less than 2 liters   At the [facility], you and your health needs are our priority. As part of our continuing mission to provide you with exceptional heart care, we have created designated Provider Care Teams. These Care Teams include your primary Cardiologist (physician) and Advanced Practice Providers (APPs- Physician Assistants and Nurse Practitioners) who all work together to provide you with the care you need, when you need it.   You may see any of the following providers on your designated Care Team at your next follow up: Marland Kitchen Dr Arvilla Meres . Dr Marca Ancona . Tonye Becket, NP . Robbie Lis, PA . Karle Plumber, PharmD   Please be sure to bring in all your medications bottles to every appointment.

## 2020-04-29 ENCOUNTER — Encounter (HOSPITAL_COMMUNITY): Payer: Self-pay

## 2020-04-30 ENCOUNTER — Telehealth (HOSPITAL_COMMUNITY): Payer: Self-pay | Admitting: *Deleted

## 2020-04-30 NOTE — Telephone Encounter (Signed)
Patients wife called stating zoll life vest alarmed yesterday and today it vibrated like it wanted to shock him but he paused the shock. Pt c/o shortness of breath but denies dizziness and chest pain  Secure chat sent to Dr.Winfrey

## 2020-04-30 NOTE — Telephone Encounter (Signed)
Per Dr.Winfrey have rep send report. I contacted Alvino Chapel she reviewed the report and emailed it to me. Per Alvino Chapel the report showed some artifact other than that device is fine and patients heart rate is normal and in rhythm. Device was not going to shock pt. Dr.Winfrey and patient aware.

## 2020-05-02 ENCOUNTER — Telehealth: Payer: Self-pay | Admitting: *Deleted

## 2020-05-02 ENCOUNTER — Encounter (HOSPITAL_COMMUNITY): Payer: Self-pay

## 2020-05-02 MED ORDER — CLOPIDOGREL BISULFATE 75 MG PO TABS: 300.0000 mg | ORAL_TABLET | Freq: Once | ORAL | 3 refills | Status: DC

## 2020-05-02 NOTE — Telephone Encounter (Signed)
-----  Message from [place], [facility] sent at 04/27/2020 12:28 PM EST ----- Regarding: RE: Brilinta vs Plavix Our typical protocol would be to give Plavix 300 mg once when the next Brilinta dose would be due (i.e. 12h after the last Brilinta dose), then start Plavix 75 mg the day after. Let me know if I can be of any other assistance. [phone]. Thanks  Rebbeca Paul, PharmD PGY1 Pharmacy Resident 04/27/2020 12:30 PM    ----- Message ----- From: Satira Sark, MD Sent: 04/27/2020  12:09 PM EST To: Merlene Laughter, RN, Christy Gentles, [facility] Subject: RE: Brilinta vs Plavix                         I reviewed the chart, it does not appear that I have ever met this patient.  Apparently he was scheduled to follow-up with me in the [place] office.  I will reach out to my [place] nurse to have her take care of the Plavix prescription, would please let us know about transition and loading dose of Plavix when he switches from Brilinta as this will be very important. ----- Message ----- From: Christy Gentles, [place]: 04/27/2020  11:51 AM EST To: Satira Sark, MD Subject: Brilinta vs Plavix                             Hi Dr. Domenic Polite,   I followed this patient for his recent hospitalization at [facility]. His discharge medications were a little complicated since he works at [employer], he has to fill all his prescriptions there for them to be affordable. However his Kary Kos was still expensive at [facility] ($108 per month) so we filled 30 days free at the [facility] and let him know that he may need to switch to Plavix as an outpatient from a cost standpoint. He is scheduled to follow up with you on April 5th but he will run out of his Brilinta prior to that and cannot afford to refill it. He had a visit with the [facility] today and the pharmacist there communicated that with me, so I wanted to reach out to see if you could go ahead and write him a prescription for Plavix so that  he can fill that after he runs out of his Brilinta prior to his first visit with you. His preferred pharmacy is the [facility] in [place].   Thank you!  Rebbeca Paul, PharmD PGY1 Pharmacy Resident 04/27/2020 11:49 AM  Please check [email or web address] for unit-specific pharmacy phone numbers.

## 2020-05-02 NOTE — Telephone Encounter (Signed)
Patient and significant other informed and verbalized understanding of plan. Patient will send a message through [facility] when he has one more week of brilinta left so that his plavix rx can be sent to the pharmacy.

## 2020-05-03 ENCOUNTER — Encounter (HOSPITAL_COMMUNITY): Payer: Self-pay | Admitting: *Deleted

## 2020-05-03 NOTE — Progress Notes (Signed)
Pt's disability forms completed and faxed to [facility] at [phone], pt is aware this was done and copy mailed to him

## 2020-05-15 ENCOUNTER — Other Ambulatory Visit: Payer: Self-pay | Admitting: Internal Medicine

## 2020-05-16 LAB — BASIC METABOLIC PANEL
BUN/Creatinine Ratio: 14 (ref 9–20)
BUN: 14 mg/dL (ref 6–24)
CO2: 22 mmol/L (ref 20–29)
Calcium: 10.1 mg/dL (ref 8.7–10.2)
Chloride: 105 mmol/L (ref 96–106)
Creatinine, Ser: 0.97 mg/dL (ref 0.76–1.27)
Glucose: 94 mg/dL (ref 65–99)
Potassium: 5.1 mmol/L (ref 3.5–5.2)
Sodium: 141 mmol/L (ref 134–144)
eGFR: 92 mL/min/{1.73_m2} (ref >59)

## 2020-05-17 ENCOUNTER — Telehealth (HOSPITAL_COMMUNITY): Payer: Self-pay | Admitting: Pharmacist

## 2020-05-17 ENCOUNTER — Other Ambulatory Visit: Payer: Self-pay | Admitting: *Deleted

## 2020-05-17 MED ORDER — DAPAGLIFLOZIN PROPANEDIOL 10 MG PO TABS: 10.0000 mg | ORAL_TABLET | Freq: Every day | ORAL | 1 refills | Status: DC

## 2020-05-17 MED ORDER — SPIRONOLACTONE 25 MG PO TABS: 12.5000 mg | ORAL_TABLET | Freq: Every day | ORAL | 1 refills | Status: DC

## 2020-05-17 MED ORDER — CARVEDILOL 3.125 MG PO TABS: 3.1250 mg | ORAL_TABLET | Freq: Two times a day (BID) | ORAL | 1 refills | Status: DC

## 2020-05-17 NOTE — Telephone Encounter (Signed)
Pharmacy Transitions of Care Follow-up Telephone Call  Date of discharge: 04/25/20 Discharge Diagnosis: STEMI w/stent  How have you been since you were released from the hospital? "good"   Medication changes made at discharge:   aspirin  atorvastatin(LIPITOR)  carvedilol(COREG)  dapagliflozin propanediol(FARXIGA)  losartan(COZAAR)  nitroGLYCERIN(Nitrostat)  spironolactone(ALDACTONE)  ticagrelor(BRILINTA)    Medication changes obtained and verified? Yes    Medication Accessibility:  Home Pharmacy: [name]  Was the patient provided with refills on discharged medications? Not all meds were issued with refills. Will reach out to MD to send refills to [place], [name].  Have all prescriptions been transferred from [facility] to home pharmacy? N/A   Is the patient able to afford medications? Yes, except will need assistance on Farxiga.  Brilinta is being switched to Plavix . Notable copays: n/a . Eligible patient assistance: Will contact MD TK:[id number]    Medication Review:    Reviewed both Brilinta and Plavix, as recent cardio follow-up states that pt will be switched to Plavix after finishing initial supply of Brilinta.  Pt is aware of this plan.  TICAGRELOR (BRILINTA) Ticagrelor 90 mg BID initiated on 04/22/20 inpatient.  - Educated patient on expected duration of therapy of aspirin 81mg  with ticagrelor. Advised patient that dose of ticagrelor will be changed to plavix once initial supply runs out. Pt was aware of plan.  Aspirin will be continued for at least 12 months. - Discussed importance of taking medication around the same time every day, - Reviewed potential DDIs with patient - Advised patient of medications to avoid (NSAIDs, aspirin maintenance doses>100 mg daily) - Educated that Tylenol (acetaminophen) will be the preferred analgesic to prevent risk of bleeding  - Emphasized importance of monitoring for signs and symptoms of bleeding (abnormal bruising,  prolonged bleeding, nose bleeds, bleeding from gums, discolored urine, black tarry stools)  - Educated patient to notify doctor if shortness of breath or abnormal heartbeat occur - Advised patient to alert all providers of antiplatelet therapy prior to starting a new medication or having a procedure   CLOPIDOGREL (PLAVIX) Clopidogrel will be initiated once Brilinta supply runs out due to cost.  - Educated patient on expected duration of therapy of aspirin 81mg  with clopidogrel.  - Reviewed potential DDIs with patient  - Advised patient of medications to avoid (NSAIDs, ASA)  - Educated that Tylenol (acetaminophen) will be the preferred analgesic to prevent risk of bleeding  - Emphasized importance of monitoring for signs and symptoms of bleeding (abnormal bruising, prolonged bleeding, nose bleeds, bleeding from gums, discolored urine, black tarry stools)  - Advised patient to alert all providers of anticoagulation therapy prior to starting a new medication or having a procedure    Follow-up Appointments:  PCP follow-up with Dr. on 06/05/20   [facility]u appt confirmed? Pt was seen by cardiology on 04/27/20 and will follow-up again on 05/22/20, pt aware of appt.  If their condition worsens, is the pt aware to call PCP or go to the Emergency Dept.? Yes  Final Patient Assessment: Pt was pleasant and seemed to be doing well.  He was unsure about the names of some of his medcations but was able to gather all prescription bottles and we reviewed each ensuring he was taking them appropriately.  He is having no issues and understands the importance of follow-up with cardiology. It was a pleasure speaking with this patient.

## 2020-05-17 NOTE — Telephone Encounter (Signed)
Message received via secure chat from [facility] Outpatient pharmacist [name]:see below [facility], I hope I am reaching out to the correct person. I spoke with [name] as part of our Transitions of Care pharmacy program. I have reveiwed his discharge medications/plan. I noticed that he is being switched from Brilinta to Plavix for cost savings. However, he is also taking [place] and may need financial assistance with this. Do you know if this is being addressed? Also, he currently uses [facility] and I noticed that many of his medications were issued without refills. Are you able to send refills to avoid any lapses in therapy? Thank you so much, please reach out if you have any questions! I will be in today until 5:00. [name] [phone] Thank you!!  Spoke with patient who says that he does not have farxiga 10 mg on hand. Advised to come by office to get samples and number given to patient to contact [place] for patient assistance [phone]).  [facility] contacted to get copay for farxiga and cost is $15.02 for a 30 day supply Patient informed via vm and advised to call back if he still need samples and patient assistance.

## 2020-05-22 ENCOUNTER — Ambulatory Visit (HOSPITAL_COMMUNITY)
Admission: RE | Admit: 2020-05-22 | Discharge: 2020-05-22 | Disposition: A | Discharge status: Home / Self Care | Payer: BC Managed Care – PPO | Source: Ambulatory Visit | Attending: Adult Health | Admitting: Adult Health

## 2020-05-22 ENCOUNTER — Other Ambulatory Visit: Payer: Self-pay

## 2020-05-22 ENCOUNTER — Encounter (HOSPITAL_COMMUNITY): Payer: Self-pay

## 2020-05-22 ENCOUNTER — Other Ambulatory Visit: Payer: Self-pay | Admitting: *Deleted

## 2020-05-22 VITALS — BP 116/82 | HR 65 | Wt 162.4 lb

## 2020-05-22 DIAGNOSIS — I252 Old myocardial infarction: Secondary | ICD-10-CM | POA: Insufficient documentation

## 2020-05-22 DIAGNOSIS — Z955 Presence of coronary angioplasty implant and graft: Secondary | ICD-10-CM | POA: Insufficient documentation

## 2020-05-22 DIAGNOSIS — R0602 Shortness of breath: Secondary | ICD-10-CM | POA: Insufficient documentation

## 2020-05-22 DIAGNOSIS — I5022 Chronic systolic (congestive) heart failure: Secondary | ICD-10-CM | POA: Insufficient documentation

## 2020-05-22 DIAGNOSIS — I2510 Atherosclerotic heart disease of native coronary artery without angina pectoris: Secondary | ICD-10-CM

## 2020-05-22 DIAGNOSIS — Z87891 Personal history of nicotine dependence: Secondary | ICD-10-CM

## 2020-05-22 DIAGNOSIS — Z79899 Other long term (current) drug therapy: Secondary | ICD-10-CM | POA: Insufficient documentation

## 2020-05-22 DIAGNOSIS — Z7902 Long term (current) use of antithrombotics/antiplatelets: Secondary | ICD-10-CM | POA: Diagnosis not present

## 2020-05-22 DIAGNOSIS — F17210 Nicotine dependence, cigarettes, uncomplicated: Secondary | ICD-10-CM | POA: Diagnosis not present

## 2020-05-22 DIAGNOSIS — Z7901 Long term (current) use of anticoagulants: Secondary | ICD-10-CM | POA: Insufficient documentation

## 2020-05-22 DIAGNOSIS — Z7984 Long term (current) use of oral hypoglycemic drugs: Secondary | ICD-10-CM | POA: Diagnosis not present

## 2020-05-22 DIAGNOSIS — Z7982 Long term (current) use of aspirin: Secondary | ICD-10-CM | POA: Insufficient documentation

## 2020-05-22 DIAGNOSIS — Z8249 Family history of ischemic heart disease and other diseases of the circulatory system: Secondary | ICD-10-CM | POA: Insufficient documentation

## 2020-05-22 MED ORDER — CLOPIDOGREL BISULFATE 75 MG PO TABS: 300.0000 mg | ORAL_TABLET | ORAL | 0 refills | Status: DC

## 2020-05-22 MED ORDER — DIGOXIN 125 MCG PO TABS
0.1250 mg | ORAL_TABLET | Freq: Every day | ORAL | 3 refills | Status: DC
Start: 2020-05-22 — End: 2020-06-29

## 2020-05-22 NOTE — Patient Instructions (Addendum)
No Labs done today.   START Digoxin 0..125mg (1 tablet) by mouth daily.  No other medication changes were made. Please continue all current medications as prescribed.  Your physician recommends that you schedule a follow-up appointment in: 8 weeks with Dr. Shirlee Latch.  If you have any questions or concerns before your next appointment please send [place] a message through [place] or call our office at [phone].    TO LEAVE A MESSAGE FOR THE NURSE SELECT OPTION 2, PLEASE LEAVE A MESSAGE INCLUDING: . YOUR NAME . DATE OF BIRTH . CALL BACK NUMBER . REASON FOR CALL**this is important as we prioritize the call backs  YOU WILL RECEIVE A CALL BACK THE SAME DAY AS LONG AS YOU CALL BEFORE 4:00 PM   Do the following things EVERYDAY: 1) Weigh yourself in the morning before breakfast. Write it down and keep it in a log. 2) Take your medicines as prescribed 3) Eat low salt foods--Limit salt (sodium) to 2000 mg per day.  4) Stay as active as you can everyday 5) Limit all fluids for the day to less than 2 liters   At the [facility], you and your health needs are our priority. As part of our continuing mission to provide you with exceptional heart care, we have created designated Provider Care Teams. These Care Teams include your primary Cardiologist (physician) and Advanced Practice Providers (APPs- Physician Assistants and Nurse Practitioners) who all work together to provide you with the care you need, when you need it.   You may see any of the following providers on your designated Care Team at your next follow up: Marland Kitchen Dr Arvilla Meres . Dr Marca Ancona . Tonye Becket, NP . Robbie Lis, PA . Karle Plumber, PharmD   Please be sure to bring in all your medications bottles to every appointment.

## 2020-05-22 NOTE — Progress Notes (Signed)
ADVANCED HF CLINIC CONSULT NOTE  Referring Physician: Dr Frances Furbish  Primary Care: Dr Andrey Campanile  Primary Cardiologist: Dr Tresa Endo   HPI: Eric [name] is a 55 year old with a history of CAD, Des to LAD 2009 and 04/2020 LAD, methamphetamine abuse quit in 2012, chronic systolic heart failure, and tobacco abuse.   Admitted 04/22/20 with chest pain--> STEMI. Taken for cath and had 100 occlusion LAD with subsequent stent. Succuessful PCI to LAD. Plan for DAPT for 2 months. ECHO completed and showed EF 25-30%. Discharged on 04/25/20 with Life Vest.   Had TOC visit on 04/27/20. At that time he had minimal symptoms. BP at that time was 90/60. No room for med titration.   [name] is being referred to AHF clinic by Dr Frances Furbish for HF consultation. Overall feeling ok but has occasional shortness of breath with exertion. Denies PND/Orthopnea. No bleeding issues. Wearing Life Vest. Appetite ok. No fever or chills. Weight at home 153-156  pounds. Smoking 3 cigarettes a day.Taking all medications. 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 Lives with wife.   He is wearing Technical sales engineer and says it keeps alarming so he just silences it.   ZOLL - I called ZOLL rep. Average heart rate 86 , No VT.  Cardiac Studies  ECHO  04/23/20--EF 25-30%  Akinesis mid, distal, and apical LV apex. RV normal   2009 PCI DES to LAD in [place]  04/2020 LHC  LVEPD 19 - DES LAD  2nd Diag lesion is 20% stenosed.  Mid LAD to Dist LAD lesion is 100% stenosed.  Post intervention, there is a 0% residual stenosis.  Mid LAD lesion is 20% stenosed.  A stent was successfully placed.  The left ventricular ejection fraction is 25-35% by visual estimate.  LV end diastolic pressure is mildly elevated.  There is severe left ventricular systolic dysfunction     Review of Systems: [y] = yes, [ ]  = no   General: Weight gain [ ] ; Weight loss [ ] ; Anorexia [ ] ; Fatigue [ ] ; Fever [ ] ; Chills [ ] ; Weakness [Y  ]  Cardiac: Chest pain/pressure [ ] ; Resting SOB [ ] ; Exertional SOB [ Y]; Orthopnea [ ] ; Pedal Edema [ ] ; Palpitations [ ] ; Syncope [ ] ; Presyncope [ ] ; Paroxysmal nocturnal dyspnea[ ]   Pulmonary: Cough [ ] ; Wheezing[ ] ; Hemoptysis[ ] ; Sputum [ ] ; Snoring [ ]   GI: Vomiting[ ] ; Dysphagia[ ] ; Melena[ ] ; Hematochezia [ ] ; Heartburn[ ] ; Abdominal pain [ ] ; Constipation [ ] ; Diarrhea [ ] ; BRBPR [ ]   GU: Hematuria[ ] ; Dysuria [ ] ; Nocturia[ ]   Vascular: Pain in legs with walking [ ] ; Pain in feet with lying flat [ ] ; Non-healing sores [ ] ; Stroke [ ] ; TIA [ ] ; Slurred speech [ ] ;  Neuro: Headaches[ ] ; Vertigo[ ] ; Seizures[ ] ; Paresthesias[ ] ;Blurred vision [ ] ; Diplopia [ ] ; Vision changes [ ]   Ortho/Skin: Arthritis [ ] ; Joint pain [ ] ; Muscle pain [ ] ; Joint swelling [ ] ; Back Pain [Y ]; Rash [ ]   Psych: Depression[ ] ; Anxiety[ ]   Heme: Bleeding problems [ ] ; Clotting disorders [ ] ; Anemia [ ]   Endocrine: Diabetes [ ] ; Thyroid dysfunction[ ]    Past Medical History:  Diagnosis Date  . Coronary arteriosclerosis    a. PCI LAD 2009 in [place];  b. 12/2010 Cath: LM: nl, LAD: patent stent mid, 25 mid, LCX nl, RCA: non dominant, nl, EF 50%  . History of methamphetamine abuse (HCC)  a. Used x 6 yrs, quit July 2012  . History of tobacco abuse    a. 41 yrs, up to 3 ppd, quit July 2012  . Hypercholesterolemia     Current Outpatient Medications  Medication Sig Dispense Refill  . aspirin 81 MG chewable tablet Chew 1 tablet (81 mg total) by mouth daily. 90 tablet 1  . atorvastatin (LIPITOR) 80 MG tablet Take 1 tablet (80 mg total) by mouth daily. 90 tablet 1  . carvedilol (COREG) 3.125 MG tablet Take 1 tablet (3.125 mg total) by mouth 2 (two) times daily with a meal. 60 tablet 1  . dapagliflozin propanediol (FARXIGA) 10 MG TABS tablet Take 1 tablet (10 mg total) by mouth daily before breakfast. 30 tablet 1  . losartan (COZAAR) 25 MG tablet Take 1 tablet (25 mg total) by mouth daily. 90 tablet 0  .  nitroGLYCERIN (NITROSTAT) 0.4 MG SL tablet Place 1 tablet (0.4 mg total) under the tongue every 5 (five) minutes as needed. 25 tablet 2  . spironolactone (ALDACTONE) 25 MG tablet Take 0.5 tablets (12.5 mg total) by mouth daily. 30 tablet 1  . [START ON 05/30/2020] clopidogrel (PLAVIX) 75 MG tablet Take 4 tablets (300 mg total) by mouth as directed. Once 12 hours after last dose of brilinta; then 75 mg daily thereafter 4 tablet 0   No current facility-administered medications for this encounter.    No Known Allergies    Social History   Socioeconomic History  . Marital status: Married    Spouse name: Brodyn Depuy  . Number of children: Not on file  . Years of education: Not on file  . Highest education level: Not on file  Occupational History  . Not on file  Tobacco Use  . Smoking status: Light Tobacco Smoker    Packs/day: 0.50    Years: 41.00    Pack years: 20.50    Types: Cigarettes    Last attempt to quit: 09/01/2010    Years since quitting: 9.7  . Smokeless tobacco: Never Used  . Tobacco comment: smoked up to 3 ppd x 41 yrs  Vaping Use  . Vaping Use: Never used  Substance and Sexual Activity  . Alcohol use: No  . Drug use: Not Currently    Types: Methamphetamines    Comment: previously used methamphetamines - quit 09/2010  . Sexual activity: Yes    Partners: Female    Birth control/protection: None  Other Topics Concern  . Not on file  Social History Narrative   Lives in [place].  Step-dtr near-by.  Works for a [employer].   Social Determinants of Health   Financial Resource Strain: High Risk  . Difficulty of Paying Living Expenses: Very hard  Food Insecurity: Food Insecurity Present  . Worried About Programme researcher, broadcasting/film/video in the Last Year: Sometimes true  . Ran Out of Food in the Last Year: Sometimes true  Transportation Needs: No Transportation Needs  . Lack of Transportation (Medical): No  . Lack of Transportation (Non-Medical): No  Physical Activity:  Insufficiently Active  . Days of Exercise per Week: 3 days  . Minutes of Exercise per Session: 20 min  Stress: Not on file  Social Connections: Not on file  Intimate Partner Violence: Not At Risk  . Fear of Current or Ex-Partner: No  . Emotionally Abused: No  . Physically Abused: No  . Sexually Abused: No      Family History  Problem Relation Age of Onset  . Heart attack  Father        died @ 49  . Coronary artery disease Mother        alive in late 68's    Vitals:   05/22/20 1336  BP: 116/82  Pulse: 65  SpO2: 100%  Weight: 73.7 kg    PHYSICAL EXAM: General:  Well appearing. No respiratory difficulty HEENT: normal Neck: supple. no JVD. Carotids 2+ bilat; no bruits. No lymphadenopathy or thryomegaly appreciated. Cor: PMI nondisplaced. Regular rate & rhythm. No rubs, gallops or murmurs. Life Vest on Lungs: clear Abdomen: soft, nontender, nondistended. No hepatosplenomegaly. No bruits or masses. Good bowel sounds. Extremities: no cyanosis, clubbing, rash, edema Neuro: alert & oriented x 3, cranial nerves grossly intact. moves all 4 extremities w/o difficulty. Affect pleasant.  ECG: SR 66 bpm QRS 94 ms    ASSESSMENT & PLAN:  1. Chronic Systolic Heart Failure, suspect ICM. Echo 04/2020 EF 25-30% RV normal. Had cath  NYHA III. Volume status stable. Does not need diuretics.   Continue coreg 3.125 mg twice a day - Add digoxin 0.125 mg daily - Continue losartan 25 mg daily. Hold off on entresto and consider next visit. K 5.1 on recent check  - Continue spiro 12.5 mg daily  - Continue farxiga 10 mg daily  - Continue Life Vest until repeat ECHO. If EF remains < 35% will need to refer to EP. QRS narrow so no role for CRT - I reviewed BMET from 05/15/20  2. CAD  S/P DES LAD 04/2020 - No chest pain.  - On high intensity statin.  - Continue brillinta until he completes on 3/30 then he will then start plavix as directed.  - Continue aspirin 81 mg daily - Continue current dose  of coreg and farxiga  3. Tobacco Abuse Discussed smoking cessation. He has significantly cut back.   I personally called the [employer]oll Rep, Alvino Chapel and discuss his alarms. She will follow up with him today. No VT/VF noted. Discussed current plan to include on going medication titrations with repeat ECHO in 2 months. We discussed if EF remains  < 35% will need to consider ICD. He will also need cardiac rehab.   He has follow up with Dr Diona Browner next month and this will be closer to his home. Would like to see a few more times in HF clinic and with Dr Shirlee Latch.   Follow up in 8 weeks with Dr Shirlee Latch.   Christianna Belmonte NP-C  3:48 PM

## 2020-05-30 ENCOUNTER — Telehealth: Payer: Self-pay | Admitting: Cardiology

## 2020-05-30 ENCOUNTER — Other Ambulatory Visit: Payer: Self-pay | Admitting: *Deleted

## 2020-05-30 MED ORDER — CLOPIDOGREL BISULFATE 75 MG PO TABS: 75.0000 mg | ORAL_TABLET | Freq: Every day | ORAL | 6 refills | Status: DC

## 2020-05-30 MED ORDER — CLOPIDOGREL BISULFATE 75 MG PO TABS: 300.0000 mg | ORAL_TABLET | ORAL | 0 refills | Status: DC

## 2020-05-30 NOTE — Telephone Encounter (Signed)
New message      *STAT* If patient is at the pharmacy, call can be transferred to refill team.   1. Which medications need to be refilled? (please list name of each medication and dose if known) clopidogrel (PLAVIX) 75 MG tablet  2. Which pharmacy/location (including street and city if local pharmacy) is medication to be sent to? [facility] [place]  3. Do they need a 30 day or 90 day supply?  Send in enough to get him to appointment on Tuesday , pharmacy only gave him 4 pills

## 2020-05-30 NOTE — Telephone Encounter (Signed)
Medication sent to pharmacy  

## 2020-06-05 ENCOUNTER — Ambulatory Visit: Payer: BC Managed Care – PPO | Admitting: Cardiology

## 2020-06-06 ENCOUNTER — Encounter (HOSPITAL_COMMUNITY): Admission: RE | Admit: 2020-06-06 | Payer: BC Managed Care – PPO | Source: Ambulatory Visit

## 2020-06-16 ENCOUNTER — Encounter (HOSPITAL_COMMUNITY): Payer: BC Managed Care – PPO

## 2020-06-22 ENCOUNTER — Other Ambulatory Visit: Payer: Self-pay | Admitting: Cardiology

## 2020-06-22 NOTE — Telephone Encounter (Signed)
This is a CHF pt 

## 2020-06-28 ENCOUNTER — Other Ambulatory Visit (HOSPITAL_COMMUNITY): Payer: Self-pay

## 2020-06-28 NOTE — Progress Notes (Signed)
Cardiology Office Note  Date: 06/29/2020   ID: [name], DOB [birth year], MRN [id number]  PCP:  Pcp, No  Cardiologist:  No primary care provider on file. Electrophysiologist:  None   Chief Complaint: Hospital follow up STEMI  History of Present Illness: [name] is a 55 y.o. male with a history of CAD, STEMI, acute systolic heart failure, hyperlipidemia, history of tobacco abuse, history of methamphetamine abuse, polysubstance abuse, smoker  Previous history of CAD with DES to LAD in 2009.  He presented to [facility] ED on 04/22/2020 with complaints of chest pain the prior night described as intermittent.  Early the next morning the chest pain became more consistent and was associated with nausea, vomiting and shortness of breath.  Described the pain as crushing precordial chest pain.  He was slightly diaphoretic.  EKG demonstrated ST elevation in V2, V3, V4.  Subsequent EKG 15 to 20 minutes later demonstrated persistent ST elevation in V3 and V4.  [name] was called and STEMI system activated.  Initial troponin was 7691.  Troponin peaked at 27,000.  He was sent via EMS to [facility] ED.  En route he received 2 doses of sublingual nitroglycerin, 250 mL of normal saline, 324 mg of aspirin, 4 mg morphine, 4 mg Zofran, heparin 4000 units.  He he underwent cardiac catheterization on 04/22/2020 demonstrating 20% second diagonal lesion, mid LAD to distal LAD lesion 100%, DES to LAD.  Post intervention there was 0% residual stenosis.  Mid LAD lesion 20%.  LVEF 25 to 35% by visual estimate LVEDP mildly elevated.  DAPT therapy for minimum of 12 months but recommended longer duration with aspirin and Brilinta.  His EF was noted to be 25 to 30% , grade 1 diastolic dysfunction with akinesis of the mid/distal and apical LV apex.  No evidence of volume overload.  He was started on Coreg 3.125 mg p.o. twice daily.  Losartan 12.5 mg p.o. daily and spironolactone 12.5 mg p.o. daily.  He  was started on atorvastatin 80 mg daily.  His LDL was 89.  He was counseled on smoking cessation.  He had nonsustained VT which improved with low-dose Coreg at 3.125 mg p.o. twice daily.  He was fitted for a LifeVest.  He was unable to be transitioned to [facility] prior to discharge due to soft blood pressures.  [name] was added prior to discharge.  He is here today for follow-up status post STEMI.  He is wearing a LifeVest.  He denies any discharges on LifeVest.  Has some mild left precordial chest pain at times.  States the pain does not feel like the symptoms he was having prior to her MI.  He states symptoms were primarily of feeling as though he had indigestion prior to presentation for MI.  He states since discharge he has not stopped smoking but has cut way down on smoking.  States he is smoking about a half a pack of cigarettes per day versus 3 packs before MI.  States he does have some mild shortness of breath when walking short distances.  He denies any orthostatic symptoms, CVA or TIA-like symptoms, palpitations or arrhythmias, PND, orthopnea.  Denies any bleeding.  Denies any claudication-like symptoms, DVT or PE-like symptoms, or lower extremity edema.  Denies any issues with his right radial access site.  He states he is compliant with all of his medications.  He has an upcoming appointment with Dr. Shirlee Latch at advanced heart failure clinic on May 27.  Past Medical History:  Diagnosis Date  . Coronary arteriosclerosis    a. PCI LAD 2009 in [place];  b. 12/2010 Cath: LM: nl, LAD: patent stent mid, 25 mid, LCX nl, RCA: non dominant, nl, EF 50%  . History of methamphetamine abuse (HCC)    a. Used x 6 yrs, quit July 2012  . History of tobacco abuse    a. 41 yrs, up to 3 ppd, quit July 2012  . Hypercholesterolemia     Past Surgical History:  Procedure Laterality Date  . BACK SURGERY     fusion-lumbar  . CORONARY ANGIOPLASTY WITH STENT PLACEMENT    . CORONARY/GRAFT ACUTE MI REVASCULARIZATION  N/A 04/22/2020   Procedure: Coronary/Graft Acute MI Revascularization;  Surgeon: Lennette Bihari, MD;  Location: [facility] INVASIVE CV LAB;  Service: Cardiovascular;  Laterality: N/A;  . LEFT HEART CATH AND CORONARY ANGIOGRAPHY N/A 04/22/2020   Procedure: LEFT HEART CATH AND CORONARY ANGIOGRAPHY;  Surgeon: Lennette Bihari, MD;  Location: [facility] INVASIVE CV LAB;  Service: Cardiovascular;  Laterality: N/A;    Current Outpatient Medications  Medication Sig Dispense Refill  . aspirin 81 MG chewable tablet Chew 1 tablet (81 mg total) by mouth daily. 90 tablet 1  . atorvastatin (LIPITOR) 80 MG tablet Take 1 tablet (80 mg total) by mouth daily. 90 tablet 1  . carvedilol (COREG) 3.125 MG tablet Take 1 tablet (3.125 mg total) by mouth 2 (two) times daily with a meal. 60 tablet 1  . clopidogrel (PLAVIX) 75 MG tablet Take 1 tablet (75 mg total) by mouth daily at 6 (six) AM. 30 tablet 6  . dapagliflozin propanediol (FARXIGA) 10 MG TABS tablet Take 1 tablet (10 mg total) by mouth daily before breakfast. 30 tablet 1  . digoxin (LANOXIN) 0.125 MG tablet Take 1 tablet (0.125 mg total) by mouth daily. 90 tablet 3  . losartan (COZAAR) 25 MG tablet Take 1 tablet by mouth once daily 60 tablet 0  . nitroGLYCERIN (NITROSTAT) 0.4 MG SL tablet Place 1 tablet (0.4 mg total) under the tongue every 5 (five) minutes as needed. 25 tablet 2  . spironolactone (ALDACTONE) 25 MG tablet Take 0.5 tablets (12.5 mg total) by mouth daily. 30 tablet 1   No current facility-administered medications for this visit.   Allergies:  Patient has no known allergies.   Social History: The patient  reports that he has been smoking cigarettes. He has a 20.50 pack-year smoking history. He has never used smokeless tobacco. He reports previous drug use. Drug: Methamphetamines. He reports that he does not drink alcohol.   Family History: The patient's family history includes Coronary artery disease in his mother; Heart attack in his father.   ROS:  Please  see the history of present illness. Otherwise, complete review of systems is positive for none.  All other systems are reviewed and negative.   Physical Exam: VS:  BP 114/72   Pulse 66   Ht 5\' 11"  (1.803 m)   Wt 164 lb 12.8 oz (74.8 kg)   SpO2 99%   BMI 22.98 kg/m , BMI Body mass index is 22.98 kg/m.  Wt Readings from Last 3 Encounters:  06/29/20 164 lb 12.8 oz (74.8 kg)  05/22/20 162 lb 6.4 oz (73.7 kg)  04/27/20 162 lb 6.4 oz (73.7 kg)    General: Patient appears comfortable at rest. Neck: Supple, no elevated JVP or carotid bruits, no thyromegaly. Lungs: Clear to auscultation, nonlabored breathing at rest. Cardiac: Regular rate and rhythm, no S3 or  significant systolic murmur, no pericardial rub. Extremities: No pitting edema, distal pulses 2+. Skin: Warm and dry.  Right radial access site clean and dry with 2+ pulses Musculoskeletal: No kyphosis. Neuropsychiatric: Alert and oriented x3, affect grossly appropriate.  ECG:  EKG May 22, 2020 normal sinus rhythm rate of 66, anteroseptal infarct, age undetermined, T wave abnormality consider lateral ischemia.  Recent Labwork: 04/22/2020: ALT 27; AST 66 04/23/2020: Hemoglobin 12.5; Platelets 256 05/15/2020: BUN 14; Creatinine, Ser 0.97; Potassium 5.1; Sodium 141     Component Value Date/Time   CHOL 141 04/23/2020 1010   TRIG 79 04/23/2020 1010   HDL 36 (L) 04/23/2020 1010   CHOLHDL 3.9 04/23/2020 1010   VLDL 16 04/23/2020 1010   LDLCALC 89 04/23/2020 1010    Other Studies Reviewed Today: Cath: 04/22/20   2nd Diag lesion is 20% stenosed.  Mid LAD to Dist LAD lesion is 100% stenosed.  Post intervention, there is a 0% residual stenosis.  Mid LAD lesion is 20% stenosed.  A stent was successfully placed.  The left ventricular ejection fraction is 25-35% by visual estimate.  LV end diastolic pressure is mildly elevated.  There is severe left ventricular systolic dysfunction.  Acute anterolateral myocardial  infarction secondary to total occlusion of the LAD immediately proximal to the previously placed mid LAD stent with initial TIMI 0 flow.  No significant concomitant CAD with a normal ramus intermediate, a large dominant normal left circumflex vessel, and a normal nondominant RCA.  Successful PCI to the LAD with ultimate insertion of a 3.0 x 26 mm Resolute stent postdilated with taper from 3.2-3.27mm with residual stenosis being reduced to 0%. There is resumption of brisk TIMI-3 flow. There is no change in the mild 20% stenosis proximal to the second diagonal vessel and 20% ostial second diagonal stenosis.  Severe LV dysfunction with severe hypocontractility involving the mid, distal anterolateral wall extending around the apex and involving the apical inferior segment with EF estimated at approximately 25 -30% acutely. LVEDP 19 mm.  RECOMMENDATION: DAPT for minimum of 12 months but recommend longer duration. Guideline directed medical therapy for LV dysfunction with initiation of carvedilol, with subsequent ARB and potential transition to Entresto, aldosterone blockade and possible SGLT2 inhibition depending upon LV function recovery. Aggressive lipid-lowering therapy with target LDL less than 70. Smoking cessation.  Diagnostic Dominance: Left    Intervention     Echo: 04/23/20  IMPRESSIONS    1. LVEF is severely depressed There is akinesis of the mid, distal and  apical LV apex   Global longitudinal strain is -8.2%. Left ventricular ejection  fraction, by estimation, is 25 to 30%. The left ventricle has severely  decreased function. Left ventricular diastolic parameters are consistent  with Grade I diastolic dysfunction  (impaired relaxation).  2. Right ventricular systolic function is normal. The right ventricular  size is normal. There is normal pulmonary artery systolic pressure.  3. The mitral valve is normal in structure. Trivial mitral valve   regurgitation.  4. The aortic valve is abnormal. Aortic valve regurgitation is not  visualized. Mild to moderate aortic valve sclerosis/calcification is  present, without any evidence of aortic stenosis.  5. The inferior vena cava is normal in size with greater than 50%  respiratory variability, suggesting right atrial pressure of 3 mmHg.  _____________   Assessment and Plan:  1. STEMI involving left anterior descending coronary artery (HCC)   2. HFrEF (heart failure with reduced ejection fraction) (HCC)   3. Mixed hyperlipidemia  4. Tobacco abuse   5. Non-sustained ventricular tachycardia (HCC)    1. STEMI involving left anterior descending coronary artery (HCC) Recent anterolateral STEMI secondary to total occlusion of LAD immediately proximal to previously placed mid LAD stent.  Successful overlapping stent placed with 0% residual stenosis post intervention. States he has some mild transient chest pain on the left side but nothing resembling symptoms he had prior to recent intervention.  Continue aspirin 81 mg daily.  Continue Plavix 75 mg daily.  Continue sublingual nitroglycerin as needed.  Continue Coreg 3.125 mg p.o. twice daily.  Continue Farxiga 10 mg daily.  2. HFrEF (heart failure with reduced ejection fraction) (HCC) Recent echo 04/23/2020 with severely depressed LVEF of 25 to 30%.  G1 DD.  Patient is wearing a LifeVest.  He exhibits no clinical evidence of heart failure.  Denies any significant shortness of breath, weight gain, or lower extremity edema.  Lungs are clear to auscultation.  Continue carvedilol 3.125 mg p.o. daily.  Continue digoxin 0.125 mg daily.  Continue losartan 25 mg daily.  Continue spironolactone 12.5 mg p.o. daily.  Keep follow-up with Dr. Shirlee LatchMclean at heart failure clinic Friday, May 27 2:20 PM.  Prior to  6552-month follow-up please obtain a follow-up echocardiogram  3. Mixed hyperlipidemia Continue atorvastatin 80 mg daily.  Please get follow-up FLP's and  LFTs in 6 to 8 weeks.  4. Tobacco abuse Patient states he has not stopped smoking.  He states prior to MI he was smoking 3 packs/day.  He states he is down to 1/2 pack/day now.  Encouraged cessation.  5. Non-sustained ventricular tachycardia (HCC) Patient was having some nonsustained VT during hospital stay.  After starting carvedilol episodes improved.  Continue carvedilol 3.125 mg p.o. twice daily.  Medication Adjustments/Labs and Tests Ordered: Current medicines are reviewed at length with the patient today.  Concerns regarding medicines are outlined above.   Disposition: Follow-up with Dr. Diona BrownerMcDowell or APP 3 months  Signed, Rennis HardingAndrew Quinn, NP 06/29/2020 8:21 AM    [facility] [address], [place], [place] [address] Phone: [phone]; Fax: [phone]

## 2020-06-29 ENCOUNTER — Other Ambulatory Visit: Payer: Self-pay

## 2020-06-29 ENCOUNTER — Ambulatory Visit (INDEPENDENT_AMBULATORY_CARE_PROVIDER_SITE_OTHER): Payer: BC Managed Care – PPO | Admitting: Family Medicine

## 2020-06-29 ENCOUNTER — Encounter: Payer: Self-pay | Admitting: *Deleted

## 2020-06-29 ENCOUNTER — Encounter: Payer: Self-pay | Admitting: Family Medicine

## 2020-06-29 VITALS — BP 114/72 | HR 66 | Ht 71.0 in | Wt 164.8 lb

## 2020-06-29 DIAGNOSIS — I4729 Other ventricular tachycardia: Secondary | ICD-10-CM

## 2020-06-29 DIAGNOSIS — I5020 Unspecified systolic (congestive) heart failure: Secondary | ICD-10-CM | POA: Diagnosis not present

## 2020-06-29 DIAGNOSIS — I2102 ST elevation (STEMI) myocardial infarction involving left anterior descending coronary artery: Secondary | ICD-10-CM | POA: Diagnosis not present

## 2020-06-29 DIAGNOSIS — E782 Mixed hyperlipidemia: Secondary | ICD-10-CM | POA: Diagnosis not present

## 2020-06-29 DIAGNOSIS — I472 Ventricular tachycardia: Secondary | ICD-10-CM

## 2020-06-29 DIAGNOSIS — Z720 Tobacco use: Secondary | ICD-10-CM | POA: Diagnosis not present

## 2020-06-29 MED ORDER — SPIRONOLACTONE 25 MG PO TABS: 12.5000 mg | ORAL_TABLET | Freq: Every day | ORAL | 3 refills | Status: DC

## 2020-06-29 MED ORDER — ASPIRIN EC 81 MG PO TBEC: 81.0000 mg | DELAYED_RELEASE_TABLET | Freq: Every day | ORAL | 3 refills | Status: AC

## 2020-06-29 MED ORDER — CLOPIDOGREL BISULFATE 75 MG PO TABS: 75.0000 mg | ORAL_TABLET | Freq: Every day | ORAL | 3 refills | Status: DC

## 2020-06-29 MED ORDER — DIGOXIN 125 MCG PO TABS: 0.1250 mg | ORAL_TABLET | Freq: Every day | ORAL | 3 refills | Status: DC

## 2020-06-29 MED ORDER — ATORVASTATIN CALCIUM 80 MG PO TABS: 80.0000 mg | ORAL_TABLET | Freq: Every day | ORAL | 3 refills | Status: DC

## 2020-06-29 MED ORDER — CARVEDILOL 3.125 MG PO TABS: 3.1250 mg | ORAL_TABLET | Freq: Two times a day (BID) | ORAL | 3 refills | Status: DC

## 2020-06-29 MED ORDER — DAPAGLIFLOZIN PROPANEDIOL 10 MG PO TABS: 10.0000 mg | ORAL_TABLET | Freq: Every day | ORAL | 3 refills | Status: DC

## 2020-06-29 MED ORDER — NITROGLYCERIN 0.4 MG SL SUBL: 0.4000 mg | SUBLINGUAL_TABLET | SUBLINGUAL | 3 refills | Status: DC | PRN

## 2020-06-29 MED ORDER — LOSARTAN POTASSIUM 25 MG PO TABS: 25.0000 mg | ORAL_TABLET | Freq: Every day | ORAL | 3 refills | Status: DC

## 2020-06-29 NOTE — Patient Instructions (Addendum)
Medication Instructions:  Continue all current medications.  Labwork:  FLP, LFT - due in 6-8 weeks.   Will mail reminder when time.  Testing/Procedures: Your physician has requested that you have an echocardiogram. Echocardiography is a painless test that uses sound waves to create images of your heart. It provides your doctor with information about the size and shape of your heart and how well your heart's chambers and valves are working. This procedure takes approximately one hour. There are no restrictions for this procedure - DUE JUST PRIOR TO NEXT VISIT   Follow-Up: 3 months   Any Other Special Instructions Will Be Listed Below (If Applicable). Out of work note given until Jul 30, 2020.  If you need a refill on your cardiac medications before your next appointment, please call your pharmacy.

## 2020-07-27 ENCOUNTER — Ambulatory Visit (HOSPITAL_COMMUNITY)
Admission: RE | Admit: 2020-07-27 | Discharge: 2020-07-27 | Disposition: A | Discharge status: Home / Self Care | Payer: BC Managed Care – PPO | Source: Ambulatory Visit | Attending: Cardiology | Admitting: Cardiology

## 2020-07-27 ENCOUNTER — Encounter (HOSPITAL_COMMUNITY): Payer: Self-pay | Admitting: Cardiology

## 2020-07-27 ENCOUNTER — Other Ambulatory Visit: Payer: Self-pay

## 2020-07-27 VITALS — BP 98/60 | HR 82 | Wt 164.2 lb

## 2020-07-27 DIAGNOSIS — I255 Ischemic cardiomyopathy: Secondary | ICD-10-CM | POA: Insufficient documentation

## 2020-07-27 DIAGNOSIS — I2510 Atherosclerotic heart disease of native coronary artery without angina pectoris: Secondary | ICD-10-CM | POA: Insufficient documentation

## 2020-07-27 DIAGNOSIS — Z7982 Long term (current) use of aspirin: Secondary | ICD-10-CM | POA: Diagnosis not present

## 2020-07-27 DIAGNOSIS — Z955 Presence of coronary angioplasty implant and graft: Secondary | ICD-10-CM | POA: Insufficient documentation

## 2020-07-27 DIAGNOSIS — Z7902 Long term (current) use of antithrombotics/antiplatelets: Secondary | ICD-10-CM | POA: Insufficient documentation

## 2020-07-27 DIAGNOSIS — E7800 Pure hypercholesterolemia, unspecified: Secondary | ICD-10-CM | POA: Diagnosis not present

## 2020-07-27 DIAGNOSIS — Z79899 Other long term (current) drug therapy: Secondary | ICD-10-CM | POA: Insufficient documentation

## 2020-07-27 DIAGNOSIS — Z7901 Long term (current) use of anticoagulants: Secondary | ICD-10-CM | POA: Diagnosis not present

## 2020-07-27 DIAGNOSIS — Z7984 Long term (current) use of oral hypoglycemic drugs: Secondary | ICD-10-CM | POA: Diagnosis not present

## 2020-07-27 DIAGNOSIS — R0602 Shortness of breath: Secondary | ICD-10-CM | POA: Diagnosis present

## 2020-07-27 DIAGNOSIS — I5022 Chronic systolic (congestive) heart failure: Secondary | ICD-10-CM | POA: Diagnosis not present

## 2020-07-27 DIAGNOSIS — I252 Old myocardial infarction: Secondary | ICD-10-CM | POA: Insufficient documentation

## 2020-07-27 DIAGNOSIS — F17210 Nicotine dependence, cigarettes, uncomplicated: Secondary | ICD-10-CM | POA: Diagnosis not present

## 2020-07-27 DIAGNOSIS — Z8249 Family history of ischemic heart disease and other diseases of the circulatory system: Secondary | ICD-10-CM | POA: Diagnosis not present

## 2020-07-27 LAB — LIPID PANEL
Cholesterol: 120 mg/dL (ref 0–200)
HDL: 31 mg/dL — ABNORMAL LOW
LDL Cholesterol: 45 mg/dL (ref 0–99)
Total CHOL/HDL Ratio: 3.9 ratio
Triglycerides: 221 mg/dL — ABNORMAL HIGH
VLDL: 44 mg/dL — ABNORMAL HIGH (ref 0–40)

## 2020-07-27 LAB — BASIC METABOLIC PANEL WITH GFR
Anion gap: 8 (ref 5–15)
BUN: 11 mg/dL (ref 6–20)
CO2: 26 mmol/L (ref 22–32)
Calcium: 9.6 mg/dL (ref 8.9–10.3)
Chloride: 104 mmol/L (ref 98–111)
Creatinine, Ser: 0.87 mg/dL (ref 0.61–1.24)
GFR, Estimated: >60 mL/min
Glucose, Bld: 72 mg/dL (ref 70–99)
Potassium: 4.3 mmol/L (ref 3.5–5.1)
Sodium: 138 mmol/L (ref 135–145)

## 2020-07-27 LAB — BRAIN NATRIURETIC PEPTIDE: B Natriuretic Peptide: 29.6 pg/mL (ref 0.0–100.0)

## 2020-07-27 LAB — DIGOXIN LEVEL: Digoxin Level: 0.5 ng/mL — ABNORMAL LOW (ref 0.8–2.0)

## 2020-07-27 MED ORDER — SPIRONOLACTONE 25 MG PO TABS: 25.0000 mg | ORAL_TABLET | Freq: Every day | ORAL | 3 refills | Status: DC

## 2020-07-27 MED ORDER — BUPROPION HCL ER (SR) 150 MG PO TB12: 150.0000 mg | ORAL_TABLET | Freq: Two times a day (BID) | ORAL | 2 refills | Status: DC

## 2020-07-27 NOTE — Patient Instructions (Addendum)
Labs done today. We will contact you only if your labs are abnormal.  INCREASE Spironolactone to 25mg  (1 tablet) by mouth daily.  START Wellbutrin 150mg  (1 tablet) by mouth daily for 3 days THEN INCREASE to 150mg  (1 tablet) by mouth 2 times daily.  No other medication changes were made. Please continue all current medications as prescribed.  Your physician has recommended that you have a pulmonary function test. Pulmonary Function Tests are a group of tests that measure how well air moves in and out of your lungs. Our office will be in contact with you regarding scheduling this at [facility]  Your physician recommends that you schedule a follow-up appointment in: 3 months  If you have any questions or concerns before your next appointment please send a message through [place] or call our office at [phone].    TO LEAVE A MESSAGE FOR THE NURSE SELECT OPTION 2, PLEASE LEAVE A MESSAGE INCLUDING: . YOUR NAME . DATE OF BIRTH . CALL BACK NUMBER . REASON FOR CALL**this is important as we prioritize the call backs  YOU WILL RECEIVE A CALL BACK THE SAME DAY AS LONG AS YOU CALL BEFORE 4:00 PM   Do the following things EVERYDAY: 1) Weigh yourself in the morning before breakfast. Write it down and keep it in a log. 2) Take your medicines as prescribed 3) Eat low salt foods--Limit salt (sodium) to 2000 mg per day.  4) Stay as active as you can everyday 5) Limit all fluids for the day to less than 2 liters   At the [facility], you and your health needs are our priority. As part of our continuing mission to provide you with exceptional heart care, we have created designated Provider Care Teams. These Care Teams include your primary Cardiologist (physician) and Advanced Practice Providers (APPs- Physician Assistants and Nurse Practitioners) who all work together to provide you with the care you need, when you need it.   You may see any of the following providers on your  designated Care Team at your next follow up: [address] [place] . Dr [phone] . Marland Kitchen, NP . Arvilla Meres, PA . Marca Ancona, PharmD   Please be sure to bring in all your medications bottles to every appointment.

## 2020-07-27 NOTE — Progress Notes (Signed)
Per Dr Shirlee Latch pt needs to remain out of work for at least another 3 months, pt brought [place] disability forms with him to appt, forms completed and signed by Dr Shirlee Latch. Forms faxed into [place] at [phone], pt aware and given copy for his records.

## 2020-07-29 NOTE — Progress Notes (Signed)
ADVANCED HF CLINIC CONSULT NOTE  PCP: Pcp, No Cardiology: Dr. Shirlee Latch  HPI: [name] is a 55 y.o. with a history of CAD, DES to LAD 2009 and 04/2020 LAD, methamphetamine abuse quit in 2012, chronic systolic heart failure, and tobacco abuse.   Admitted 04/22/20 with chest pain--> anterolatelal STEMI. Taken for cath and had total occlusion mid LAD with subsequent PCI to LAD. Echo in 2/22 showed EF 25-30%. Discharged on 04/25/20 with Lifevest.   Patient returns for followup of CAD and CHF. He is still smoking about 1/2 ppd. He walks a couple of blocks daily but gets short of breath after 1/2 block.  He rides his bike some.  No chest pain.  No orthopnea/PND.  No lightheadedness. Wearing Lifevest.   Labs (3/22): K 5.1, creatinine 0.97  PMH: 1. Active smoker.  2. CAD: 2009 PCI to mid LAD.  - 2/22 anterolateral STEMI: mLAD occluded just beyond prior stent, treated with PCI to mLAD.  3. Chronic systolic CHF: Ischemic cardiomyopathy.  Echo (2/22) with EF 25-30% with LAD-territory WMAs, normal RV.  4. H/o methamphetamine use, quit 2012.  5. Hyperlipidemia  ROS: All systems reviewed and negative except as per HPI.    Current Outpatient Medications  Medication Sig Dispense Refill  . aspirin EC 81 MG tablet Take 1 tablet (81 mg total) by mouth daily. 90 tablet 3  . atorvastatin (LIPITOR) 80 MG tablet Take 1 tablet (80 mg total) by mouth daily. 90 tablet 3  . buPROPion (WELLBUTRIN SR) 150 MG 12 hr tablet Take 1 tablet (150 mg total) by mouth 2 (two) times daily. 60 tablet 2  . carvedilol (COREG) 3.125 MG tablet Take 1 tablet (3.125 mg total) by mouth 2 (two) times daily with a meal. 180 tablet 3  . clopidogrel (PLAVIX) 75 MG tablet Take 1 tablet (75 mg total) by mouth daily. 90 tablet 3  . dapagliflozin propanediol (FARXIGA) 10 MG TABS tablet Take 1 tablet (10 mg total) by mouth daily. 90 tablet 3  . digoxin (LANOXIN) 0.125 MG tablet Take 1 tablet (0.125 mg total) by mouth daily. 90 tablet 3   . losartan (COZAAR) 25 MG tablet Take 1 tablet (25 mg total) by mouth daily. 90 tablet 3  . nitroGLYCERIN (NITROSTAT) 0.4 MG SL tablet Place 1 tablet (0.4 mg total) under the tongue every 5 (five) minutes as needed. 25 tablet 3  . spironolactone (ALDACTONE) 25 MG tablet Take 1 tablet (25 mg total) by mouth daily. 90 tablet 3   No current facility-administered medications for this encounter.    No Known Allergies    Social History   Socioeconomic History  . Marital status: Married    Spouse name: [name]  . Number of children: Not on file  . Years of education: Not on file  . Highest education level: Not on file  Occupational History  . Not on file  Tobacco Use  . Smoking status: Light Tobacco Smoker    Packs/day: 0.50    Years: 41.00    Pack years: 20.50    Types: Cigarettes    Last attempt to quit: 09/01/2010    Years since quitting: 9.9  . Smokeless tobacco: Never Used  . Tobacco comment: smoked up to 3 ppd x 41 yrs  Vaping Use  . Vaping Use: Never used  Substance and Sexual Activity  . Alcohol use: No  . Drug use: Not Currently    Types: Methamphetamines    Comment: previously used methamphetamines - quit 09/2010  .  Sexual activity: Yes    Partners: Female    Birth control/protection: None  Other Topics Concern  . Not on file  Social History Narrative   Lives in [place].  Step-dtr near-by.  Works for a [employer].   Social Determinants of Health   Financial Resource Strain: High Risk  . Difficulty of Paying Living Expenses: Very hard  Food Insecurity: Food Insecurity Present  . Worried About Programme researcher, broadcasting/film/video in the Last Year: Sometimes true  . Ran Out of Food in the Last Year: Sometimes true  Transportation Needs: No Transportation Needs  . Lack of Transportation (Medical): No  . Lack of Transportation (Non-Medical): No  Physical Activity: Insufficiently Active  . Days of Exercise per Week: 3 days  . Minutes of Exercise per Session: 20 min   Stress: Not on file  Social Connections: Not on file  Intimate Partner Violence: Not At Risk  . Fear of Current or Ex-Partner: No  . Emotionally Abused: No  . Physically Abused: No  . Sexually Abused: No      Family History  Problem Relation Age of Onset  . Heart attack Father        died @ 5  . Coronary artery disease Mother        alive in late 33's    Vitals:   07/27/20 1424  BP: 98/60  Pulse: 82  SpO2: 99%  Weight: 74.5 kg (164 lb 4 oz)    PHYSICAL EXAM: General: NAD Neck: No JVD, no thyromegaly or thyroid nodule.  Lungs: Clear to auscultation bilaterally with normal respiratory effort. CV: Nondisplaced PMI.  Heart regular S1/S2, no S3/S4, no murmur.  No peripheral edema.  No carotid bruit.  Normal pedal pulses.  Abdomen: Soft, nontender, no hepatosplenomegaly, no distention.  Skin: Intact without lesions or rashes.  Neurologic: Alert and oriented x 3.  Psych: Normal affect. Extremities: No clubbing or cyanosis.  HEENT: Normal.   ASSESSMENT & PLAN:  1. Chronic systolic CHF: Ischemic cardiomyopathy. Echo 04/2020 with EF 25-30% RV normal.  Still with NYHA class II-III symptoms but not volume overloaded on exam.  BP soft but denies lightheadedness.  - Continue Coreg 3.125 mg twice a day - Continue digoxin 0.125 mg daily, check level.  - Continue losartan 25 mg daily. Hold off on transition to [place] today with relatively low BP.  - Increase spironolactone to 25 mg daily if K is ok on BMET today.  Repeat BMET 10 days.  - Continue Farxiga 10 mg daily  - Continue Lifevest and needs to be set up for repeat echo.  He will need ICD if EF < 35%, not CRT candidate with narrow QRS. 2. CAD: DES to mLAD in 2009.  Anterolateral STEMI in 2/22 with DES to mLAD.  No chest pain.  - Continue ASA 81 and Plavix 75 daily.  - Continue atorvastatin 80 daily.  - He did not have insurance coverage for cardiac rehab.  3. Tobacco Abuse: Discussed smoking cessation. He is trying to cut  back.  - I will start him on Wellbutrin for smoking cessation.  - Check PFTs.  He looks euvolemic, and I wonder if some of his current exertional dyspnea is not from COPD.   Followup in 3 months with me.  If EF < 35% on echo, will refer to EP for ICD .  Marca Ancona 07/29/2020

## 2020-08-06 ENCOUNTER — Telehealth (HOSPITAL_COMMUNITY): Payer: Self-pay | Admitting: Licensed Clinical Social Worker

## 2020-08-06 NOTE — Telephone Encounter (Signed)
CSW received call from patient stating he has some additional paperwork related to his disability. Patient requesting assistance and will fax to CSW for follow up. Lasandra Beech, LCSW, CCSW-MCS [phone]

## 2020-08-07 ENCOUNTER — Telehealth (HOSPITAL_COMMUNITY): Payer: Self-pay | Admitting: Licensed Clinical Social Worker

## 2020-08-07 NOTE — Telephone Encounter (Signed)
CSW assisted with completion of disability paperwork and faxed per patient request. CSW available as needed. Lasandra Beech, LCSW, CCSW-MCS [phone]

## 2020-08-28 ENCOUNTER — Encounter (HOSPITAL_COMMUNITY): Payer: Self-pay | Admitting: *Deleted

## 2020-08-28 NOTE — Progress Notes (Signed)
Pt's disability forms completed, signed by Dr Shirlee Latch and faxed to [place] on 08/22/20

## 2020-09-11 ENCOUNTER — Ambulatory Visit (INDEPENDENT_AMBULATORY_CARE_PROVIDER_SITE_OTHER): Payer: BC Managed Care – PPO

## 2020-09-11 DIAGNOSIS — I5020 Unspecified systolic (congestive) heart failure: Secondary | ICD-10-CM

## 2020-09-11 LAB — ECHOCARDIOGRAM COMPLETE
Area-P 1/2: 3.02 cm2
Calc EF: 48.1 %
S' Lateral: 3.21 cm
Single Plane A2C EF: 43 %
Single Plane A4C EF: 52.5 %

## 2020-09-14 ENCOUNTER — Telehealth: Payer: Self-pay | Admitting: *Deleted

## 2020-09-14 ENCOUNTER — Institutional Professional Consult (permissible substitution): Payer: BC Managed Care – PPO | Admitting: Internal Medicine

## 2020-09-14 ENCOUNTER — Encounter: Payer: Self-pay | Admitting: Internal Medicine

## 2020-09-14 ENCOUNTER — Ambulatory Visit (INDEPENDENT_AMBULATORY_CARE_PROVIDER_SITE_OTHER): Payer: BC Managed Care – PPO | Admitting: Internal Medicine

## 2020-09-14 ENCOUNTER — Other Ambulatory Visit: Payer: Self-pay

## 2020-09-14 DIAGNOSIS — R0600 Dyspnea, unspecified: Secondary | ICD-10-CM | POA: Diagnosis not present

## 2020-09-14 DIAGNOSIS — F17210 Nicotine dependence, cigarettes, uncomplicated: Secondary | ICD-10-CM

## 2020-09-14 DIAGNOSIS — R0609 Other forms of dyspnea: Secondary | ICD-10-CM | POA: Insufficient documentation

## 2020-09-14 NOTE — Assessment & Plan Note (Addendum)
Referred for low dose CT scanning 09/14/2020 >>>   Low-dose CT lung cancer screening is recommended for patients who are 72-57 years of age with a 30+ pack-year history of smoking, and who are currently smoking or quit <=15 years ago.   eligile for the next 15 ears   Counseled re importance of smoking cessation but did not meet time criteria for separate billing           Each maintenance medication was reviewed in detail including emphasizing most importantly the difference between maintenance and prns and under what circumstances the prns are to be triggered using an action plan format where appropriate.  Total time for H and P, chart review, counseling,   and generating customized AVS unique to this office visit / same day charting = 33 min

## 2020-09-14 NOTE — Telephone Encounter (Signed)
Lesle Chris, LPN  11/16/3844  5:27 PM EDT Back to Top     Notified, copy to pcp.

## 2020-09-14 NOTE — Progress Notes (Addendum)
[name], male    DOB: 12-Sep-1965,   MRN: [id number]   Brief patient profile:  55 yowwm active  smoker referred to pulmonary clinic in [place]  09/14/2020   by Doctor on demand for doe with onset 2020   history of CAD, DES to LAD 2009 and 04/2020 LAD, methamphetamine abuse quit in 2012, chronic systolic heart failure, and tobacco abuse.   Admitted 04/22/20 with chest pain--> anterolatelal STEMI. Taken for cath and had total occlusion mid LAD with subsequent PCI to LAD. Echo in 2/22 showed EF 25-30%. Discharged on 04/25/20 with [facility].    History of Present Illness  09/14/2020  Pulmonary/ 1st office eval/ [name] / [place] [facility]  Chief Complaint  Patient presents with   Pulmonary Consult    Pt states referred here by Doctor on Demand. He states he had been having some DOE but this has been better recently.   Dyspnea:  walks around block every day x 15 min some hills s stopping slow pace  = MMRC2 = can't walk a nl pace on a flat grade s sob but does fine slow and flat  Cough: none  Sleep: lies flat bed one pillow  SABA use: no inhaler  Covid statsu :  vax x 2   No obvious day to day or daytime variability or assoc excess/ purulent sputum or mucus plugs or hemoptysis or cp or chest tightness, subjective wheeze or overt sinus or hb symptoms.   Sleeping  without nocturnal  or early am exacerbation  of respiratory  c/o's or need for noct saba. Also denies any obvious fluctuation of symptoms with weather or environmental changes or other aggravating or alleviating factors except as outlined above   No unusual exposure hx or h/o childhood pna/ asthma or knowledge of premature birth.  Current Allergies, Complete Past Medical History, Past Surgical History, Family History, and Social History were reviewed in [employer] record.  ROS  The following are not active complaints unless bolded Hoarseness, sore throat, dysphagia, dental problems, itching, sneezing,   nasal congestion or discharge of excess mucus or purulent secretions, ear ache,   fever, chills, sweats, unintended wt loss or wt gain, classically pleuritic or exertional cp,  orthopnea pnd or arm/hand swelling  or leg swelling, presyncope, palpitations, abdominal pain, anorexia, nausea, vomiting, diarrhea  or change in bowel habits or change in bladder habits, change in stools or change in urine, dysuria, hematuria,  rash, arthralgias, visual complaints, headache, numbness, weakness or ataxia or problems with walking or coordination,  change in mood/ anxious or  memory.           Past Medical History:  Diagnosis Date   Coronary arteriosclerosis    a. PCI LAD 2009 in [place];  b. 12/2010 Cath: LM: nl, LAD: patent stent mid, 25 mid, LCX nl, RCA: non dominant, nl, EF 50%   History of methamphetamine abuse (HCC)    a. Used x 6 yrs, quit July 2012   History of tobacco abuse    a. 55 yrs, up to 3 ppd, quit July 2012   Hypercholesterolemia     Outpatient Medications Prior to Visit  Medication Sig Dispense Refill   aspirin EC 81 MG tablet Take 1 tablet (81 mg total) by mouth daily. 90 tablet 3   atorvastatin (LIPITOR) 80 MG tablet Take 1 tablet (80 mg total) by mouth daily. 90 tablet 3   buPROPion (WELLBUTRIN SR) 150 MG 12 hr tablet Take 1 tablet (150 mg  total) by mouth 2 (two) times daily. 60 tablet 2   carvedilol (COREG) 3.125 MG tablet Take 1 tablet (3.125 mg total) by mouth 2 (two) times daily with a meal. 180 tablet 3   clopidogrel (PLAVIX) 75 MG tablet Take 1 tablet (75 mg total) by mouth daily. 90 tablet 3   dapagliflozin propanediol (FARXIGA) 10 MG TABS tablet Take 1 tablet (10 mg total) by mouth daily. 90 tablet 3   digoxin (LANOXIN) 0.125 MG tablet Take 1 tablet (0.125 mg total) by mouth daily. 90 tablet 3   losartan (COZAAR) 25 MG tablet Take 1 tablet (25 mg total) by mouth daily. 90 tablet 3   nitroGLYCERIN (NITROSTAT) 0.4 MG SL tablet Place 1 tablet (0.4 mg total) under the tongue every 5  (five) minutes as needed. 25 tablet 3   spironolactone (ALDACTONE) 25 MG tablet Take 1 tablet (25 mg total) by mouth daily. 90 tablet 3   No facility-administered medications prior to visit.     Objective:     BP (!) 84/58 (BP Location: Left Arm, Cuff Size: Normal)   Pulse 84   Temp (!) 97 F (36.1 C) (Oral)   Ht 5\' 11"  (1.803 m)   Wt 161 lb (73 kg)   SpO2 99% Comment: on RA  BMI 22.45 kg/m   SpO2: 99 % (on RA)  Amb somber wm wearing lifevest   HEENT : pt wearing mask not removed for exam due to covid - 19 concerns.   NECK :  without JVD/Nodes/TM/ nl carotid upstrokes bilaterally   LUNGS: no acc muscle use,  Min barrel  contour chest wall with bilateral  slightly decreased bs s audible wheeze and  without cough on insp or exp maneuvers and min  Hyperresonant  to  percussion bilaterally     CV:  RRR  no s3 or murmur or increase in P2, and no edema   ABD:  soft and nontender with pos end  insp Hoover's  in the supine position. No bruits or organomegaly appreciated, bowel sounds nl  MS:   Nl gait/  ext warm without deformities, calf tenderness, cyanosis or clubbing No obvious joint restrictions   SKIN: warm and dry without lesions    NEURO:  alert, approp, nl sensorium with  no motor or cerebellar deficits apparent.             I personally reviewed images and agree with radiology impression as follows:  CXR:   portable 2/20/ Normal heart size, mediastinal contours, and pulmonary vascularity.     Assessment   DOE (dyspnea on exertion) Active smoker - onset 2020  - Echo 09/11/20  1. Left ventricular ejection fraction, by estimation, is 40 to 45%. The  left ventricle has mildly decreased function. The left ventricle  demonstrates regional wall motion abnormalities (see scoring  diagram/findings for description). Left ventricular  diastolic parameters are consistent with Grade I diastolic dysfunction    2. Right ventricular systolic function is normal. The  right ventricular  size is normal. There is normal pulmonary artery systolic pressure. The  estimated right ventricular systolic pressure is 19.0 mmHg with est right atrial pressure of 3 mmHg. - PFT's 09/14/2020 >>>  Clinically he only has mild copd s much in terms of AB or exac.    I reviewed the Fletcher curve with the patient that basically indicates  if you quit smoking when your best day FEV1 is still relatively well preserved (which is likely  the case here)  it  is highly unlikely you will progress to severe disease and informed the patient there was  no medication on the market that has proven to alter the curve/ its downward trajectory  or the likelihood of progression of their disease(unlike other chronic medical conditions such as atheroclerosis where we do think we can change the natural hx with risk reducing meds)    Therefore stopping smoking and maintaining abstinence are  the most important aspects of care, not choice of inhalers or for that matter, doctors.   Treatment other than smoking cessation  is entirely directed by severity of symptoms and focused also on reducing exacerbations, not attempting to change the natural history of the disease. Since not really limited from desired activities and not having aecopd, rx is optional at this point and he declined rx for now (lama would probably be best given his heart dz and need to avoid sympathomimetics)   Will contact him after pfts but no need for f/u here unless having limiting doe.    Cigarette smoker Referred for low dose CT scanning 09/14/2020 >>>   Low-dose CT lung cancer screening is recommended for patients who are 40-31 years of age with a 30+ pack-year history of smoking, and who are currently smoking or quit <=15 years ago.   eligile for the next 15 ears   Counseled re importance of smoking cessation but did not meet time criteria for separate billing         Each maintenance medication was reviewed in detail  including emphasizing most importantly the difference between maintenance and prns and under what circumstances the prns are to be triggered using an action plan format where appropriate.  Total time for H and P, chart review, counseling,   and generating customized AVS unique to this office visit / same day charting = 33 min   Sandrea Hughs, MD 09/14/2020

## 2020-09-14 NOTE — Telephone Encounter (Signed)
Correction , no pcp.    Saw Pulmonology today & has seen heart failure as well.

## 2020-09-14 NOTE — Patient Instructions (Addendum)
We will contact you to get into the lung cancer screening program   Also we'll let you know about the PFT schedule  and call you the results when done   The key is to stop smoking completely before smoking completely stops you!      Pulmonary follow up is as needed.

## 2020-09-14 NOTE — Telephone Encounter (Signed)
-----   Message from Netta Neat., NP sent at 09/12/2020 11:37 AM EDT ----- Please call the patient and let him know the echocardiogram showed the pumping function of his heart is improving.  The previous echocardiogram in February showed his pumping function was 25 to 30%.  The recent echocardiogram showed the pumping function has improved to 40 to 45%.  This is still a little below normal but much better than previous study.  His main pumping chamber is a little stiff and has problems relaxing.  Best way to manage it is make sure to take all the medications prescribed and keep your blood pressure at or below 130/80 consistently.  No recreational substance use.  Follow-up with heart failure clinic if scheduled to to see them.  Netta Neat, NP  09/12/2020 11:36 AM

## 2020-09-15 ENCOUNTER — Encounter: Payer: Self-pay | Admitting: Internal Medicine

## 2020-09-15 NOTE — Assessment & Plan Note (Addendum)
Active smoker - onset 2020  - Echo 09/11/20 1. Left ventricular ejection fraction, by estimation, is 40 to 45%. The  left ventricle has mildly decreased function. The left ventricle  demonstrates regional wall motion abnormalities (see scoring  diagram/findings for description). Left ventricular  diastolic parameters are consistent with Grade I diastolic dysfunction   2. Right ventricular systolic function is normal. The right ventricular  size is normal. There is normal pulmonary artery systolic pressure. The  estimated right ventricular systolic pressure is 19.0 mmHg with est right atrial pressure of 3 mmHg. - PFT's 09/14/2020 >>>  Clinically he only has mild copd s much in terms of AB or exac.    I reviewed the Fletcher curve with the patient that basically indicates  if you quit smoking when your best day FEV1 is still relatively well preserved (which is likely  the case here)  it is highly unlikely you will progress to severe disease and informed the patient there was  no medication on the market that has proven to alter the curve/ its downward trajectory  or the likelihood of progression of their disease(unlike other chronic medical conditions such as atheroclerosis where we do think we can change the natural hx with risk reducing meds)    Therefore stopping smoking and maintaining abstinence are  the most important aspects of care, not choice of inhalers or for that matter, doctors.   Treatment other than smoking cessation  is entirely directed by severity of symptoms and focused also on reducing exacerbations, not attempting to change the natural history of the disease. Since not really limited from desired activities and not having aecopd, rx is optional at this point and he declined rx for now (lama would probably be best given his heart dz and need to avoid sympathomimetics)   Will contact him after pfts but no need for f/u here unless having limiting doe.

## 2020-09-26 ENCOUNTER — Telehealth: Payer: Self-pay | Admitting: Internal Medicine

## 2020-09-26 ENCOUNTER — Other Ambulatory Visit: Payer: Self-pay | Admitting: *Deleted

## 2020-09-26 DIAGNOSIS — Z87891 Personal history of nicotine dependence: Secondary | ICD-10-CM

## 2020-09-26 DIAGNOSIS — F17210 Nicotine dependence, cigarettes, uncomplicated: Secondary | ICD-10-CM

## 2020-09-26 NOTE — Telephone Encounter (Signed)
I called the pt back to let him know that we did not call him for anything.  No results in the chart at this time.  I did not see an open encounter where we had called for anything.  I was not able to LM as the VM never connected.  Will let him know when he calls back.

## 2020-09-27 ENCOUNTER — Other Ambulatory Visit: Payer: BC Managed Care – PPO

## 2020-09-27 NOTE — Telephone Encounter (Signed)
Angelique Blonder, did you call this pt by chance? He was returning a call and I seen that lung ca screening was recently ordered. Thanks!

## 2020-09-28 ENCOUNTER — Other Ambulatory Visit: Payer: Self-pay

## 2020-09-28 ENCOUNTER — Other Ambulatory Visit (HOSPITAL_COMMUNITY)
Admission: RE | Admit: 2020-09-28 | Discharge: 2020-09-28 | Disposition: A | Discharge status: Home / Self Care | Payer: BC Managed Care – PPO | Source: Ambulatory Visit | Attending: Internal Medicine | Admitting: Internal Medicine

## 2020-09-28 ENCOUNTER — Ambulatory Visit: Payer: BC Managed Care – PPO | Admitting: Family Medicine

## 2020-09-28 DIAGNOSIS — Z20822 Contact with and (suspected) exposure to covid-19: Secondary | ICD-10-CM | POA: Insufficient documentation

## 2020-09-28 DIAGNOSIS — Z01812 Encounter for preprocedural laboratory examination: Secondary | ICD-10-CM | POA: Diagnosis present

## 2020-09-29 LAB — SARS CORONAVIRUS 2 (TAT 6-24 HRS): SARS Coronavirus 2: NEGATIVE

## 2020-10-01 ENCOUNTER — Encounter (HOSPITAL_COMMUNITY): Payer: Self-pay

## 2020-10-01 ENCOUNTER — Other Ambulatory Visit: Payer: Self-pay

## 2020-10-01 ENCOUNTER — Encounter (HOSPITAL_COMMUNITY)
Admission: RE | Admit: 2020-10-01 | Discharge: 2020-10-01 | Disposition: A | Discharge status: Home / Self Care | Payer: BC Managed Care – PPO | Source: Ambulatory Visit | Attending: Cardiovascular Disease | Admitting: Cardiovascular Disease

## 2020-10-01 VITALS — Ht 71.0 in | Wt 160.7 lb

## 2020-10-01 DIAGNOSIS — I2102 ST elevation (STEMI) myocardial infarction involving left anterior descending coronary artery: Secondary | ICD-10-CM | POA: Diagnosis present

## 2020-10-01 DIAGNOSIS — Z955 Presence of coronary angioplasty implant and graft: Secondary | ICD-10-CM | POA: Diagnosis present

## 2020-10-01 NOTE — Progress Notes (Signed)
Cardiac Individual Treatment Plan  Patient Details  Name: [name] MRN: [id number] Date of Birth: Jan 09, 1966 Referring Provider:   Flowsheet Row CARDIAC REHAB PHASE II ORIENTATION from 10/01/2020 in [facility] CARDIAC REHABILITATION  Referring Provider Dr. Tresa Endo       Initial Encounter Date:  Flowsheet Row CARDIAC REHAB PHASE II ORIENTATION from 10/01/2020 in [place] CARDIAC REHABILITATION  Date 10/01/20       Visit Diagnosis: ST elevation myocardial infarction involving left anterior descending (LAD) coronary artery [facility])  Status post coronary artery stent placement  Patient's Home Medications on Admission:  Current Outpatient Medications:    aspirin EC 81 MG tablet, Take 1 tablet (81 mg total) by mouth daily., Disp: 90 tablet, Rfl: 3   atorvastatin (LIPITOR) 80 MG tablet, Take 1 tablet (80 mg total) by mouth daily., Disp: 90 tablet, Rfl: 3   buPROPion (WELLBUTRIN SR) 150 MG 12 hr tablet, Take 1 tablet (150 mg total) by mouth 2 (two) times daily., Disp: 60 tablet, Rfl: 2   carvedilol (COREG) 3.125 MG tablet, Take 1 tablet (3.125 mg total) by mouth 2 (two) times daily with a meal., Disp: 180 tablet, Rfl: 3   clopidogrel (PLAVIX) 75 MG tablet, Take 1 tablet (75 mg total) by mouth daily., Disp: 90 tablet, Rfl: 3   dapagliflozin propanediol (FARXIGA) 10 MG TABS tablet, Take 1 tablet (10 mg total) by mouth daily., Disp: 90 tablet, Rfl: 3   digoxin (LANOXIN) 0.125 MG tablet, Take 1 tablet (0.125 mg total) by mouth daily., Disp: 90 tablet, Rfl: 3   losartan (COZAAR) 25 MG tablet, Take 1 tablet (25 mg total) by mouth daily., Disp: 90 tablet, Rfl: 3   nitroGLYCERIN (NITROSTAT) 0.4 MG SL tablet, Place 1 tablet (0.4 mg total) under the tongue every 5 (five) minutes as needed., Disp: 25 tablet, Rfl: 3   spironolactone (ALDACTONE) 25 MG tablet, Take 1 tablet (25 mg total) by mouth daily., Disp: 90 tablet, Rfl: 3  Past Medical History: Past Medical History:  Diagnosis Date   Coronary  arteriosclerosis    a. PCI LAD 2009 in [place];  b. 12/2010 Cath: LM: nl, LAD: patent stent mid, 25 mid, LCX nl, RCA: non dominant, nl, EF 50%   History of methamphetamine abuse (HCC)    a. Used x 6 yrs, quit July 2012   History of tobacco abuse    a. 41 yrs, up to 3 ppd, quit July 2012   Hypercholesterolemia     Tobacco Use: Social History   Tobacco Use  Smoking Status Light Smoker   Packs/day: 3.00   Years: 51.00   Pack years: 153.00   Types: Cigarettes  Smokeless Tobacco Never  Tobacco Comments   Currently 6-8 cigs per day    Labs: Recent Review Flowsheet Data     Labs for ITP Cardiac and Pulmonary Rehab Latest Ref Rng & Units 12/20/2010 04/22/2020 04/23/2020 04/24/2020 07/27/2020   Cholestrol 0 - 200 mg/dL 409 - 811 - 914   LDLCALC 0 - 99 mg/dL 64 - 89 - 45   HDL >78 mg/dL 29(F) - 62(Z) - 30(Q)   Trlycerides <150 mg/dL 657 - 79 - 846(N)   Hemoglobin A1c 4.8 - 5.6 % - - - 5.4 -   TCO2 22 - 32 mmol/L - 23 - - -       Capillary Blood Glucose: No results found for: GLUCAP   Exercise Target Goals: Exercise Program Goal: Individual exercise prescription set using results from initial 6 min walk  test and THRR while considering  patient's activity barriers and safety.   Exercise Prescription Goal: Starting with aerobic activity 30 plus minutes a day, 3 days per week for initial exercise prescription. Provide home exercise prescription and guidelines that participant acknowledges understanding prior to discharge.  Activity Barriers & Risk Stratification:  Activity Barriers & Cardiac Risk Stratification - 10/01/20 1255       Activity Barriers & Cardiac Risk Stratification   Activity Barriers Shortness of Breath;Decreased Ventricular Function    Cardiac Risk Stratification High             6 Minute Walk:  6 Minute Walk     Row Name 10/01/20 1421         6 Minute Walk   Phase Initial     Distance 1300 feet     Walk Time 6 minutes     # of Rest Breaks 0     MPH  2.46     METS 3.62     RPE 11     VO2 Peak 12.65     Symptoms Yes (comment)     Comments Right hip pain 4/10     Resting HR 74 bpm     Resting BP 90/60     Resting Oxygen Saturation  98 %     Exercise Oxygen Saturation  during 6 min walk 99 %     Max Ex. HR 80 bpm     Max Ex. BP 98/70     2 Minute Post BP 86/58              Oxygen Initial Assessment:   Oxygen Re-Evaluation:   Oxygen Discharge (Final Oxygen Re-Evaluation):   Initial Exercise Prescription:  Initial Exercise Prescription - 10/01/20 1400       Date of Initial Exercise RX and Referring Provider   Date 10/01/20    Referring Provider Dr. Tresa Endo    Expected Discharge Date 12/24/20      Treadmill   MPH 1.5    Grade 0    Minutes 17      NuStep   Level 1    SPM 80    Minutes 22      Prescription Details   Frequency (times per week) 3    Duration Progress to 30 minutes of continuous aerobic without signs/symptoms of physical distress      Intensity   THRR 40-80% of Max Heartrate 65-130    Ratings of Perceived Exertion 11-13    Perceived Dyspnea 0-4      Resistance Training   Training Prescription Yes    Weight 4 lbs    Reps 10-15             Perform Capillary Blood Glucose checks as needed.  Exercise Prescription Changes:   Exercise Comments:   Exercise Goals and Review:   Exercise Goals     Row Name 10/01/20 1432             Exercise Goals   Increase Physical Activity Yes       Intervention Provide advice, education, support and counseling about physical activity/exercise needs.;Develop an individualized exercise prescription for aerobic and resistive training based on initial evaluation findings, risk stratification, comorbidities and participant's personal goals.       Expected Outcomes Short Term: Attend rehab on a regular basis to increase amount of physical activity.;Long Term: Add in home exercise to make exercise part of routine and to increase amount of physical  activity.;Long Term:  Exercising regularly at least 3-5 days a week.       Increase Strength and Stamina Yes       Intervention Provide advice, education, support and counseling about physical activity/exercise needs.;Develop an individualized exercise prescription for aerobic and resistive training based on initial evaluation findings, risk stratification, comorbidities and participant's personal goals.       Expected Outcomes Short Term: Increase workloads from initial exercise prescription for resistance, speed, and METs.;Short Term: Perform resistance training exercises routinely during rehab and add in resistance training at home;Long Term: Improve cardiorespiratory fitness, muscular endurance and strength as measured by increased METs and functional capacity ( )       Able to understand and use rate of perceived exertion (RPE) scale Yes       Intervention Provide education and explanation on how to use RPE scale       Expected Outcomes Short Term: Able to use RPE daily in rehab to express subjective intensity level;Long Term:  Able to use RPE to guide intensity level when exercising independently       Knowledge and understanding of Target Heart Rate Range (THRR) Yes       Intervention Provide education and explanation of THRR including how the numbers were predicted and where they are located for reference       Expected Outcomes Short Term: Able to state/look up THRR;Long Term: Able to use THRR to govern intensity when exercising independently;Short Term: Able to use daily as guideline for intensity in rehab       Able to check pulse independently Yes       Intervention Provide education and demonstration on how to check pulse in carotid and radial arteries.;Review the importance of being able to check your own pulse for safety during independent exercise       Expected Outcomes Short Term: Able to explain why pulse checking is important during independent exercise;Long Term: Able to check  pulse independently and accurately       Understanding of Exercise Prescription Yes       Intervention Provide education, explanation, and written materials on patient's individual exercise prescription       Expected Outcomes Short Term: Able to explain program exercise prescription;Long Term: Able to explain home exercise prescription to exercise independently                Exercise Goals Re-Evaluation :    Discharge Exercise Prescription (Final Exercise Prescription Changes):   Nutrition:  Target Goals: Understanding of nutrition guidelines, daily intake of sodium 1500mg , cholesterol 200mg , calories 30% from fat and 7% or less from saturated fats, daily to have 5 or more servings of fruits and vegetables.  Biometrics:  Pre Biometrics - 10/01/20 1432       Pre Biometrics   Height 5\' 11"  (1.803 m)    Weight 160 lb 11.5 oz (72.9 kg)    Waist Circumference 33 inches    Hip Circumference 38.5 inches    Waist to Hip Ratio 0.86 %    BMI (Calculated) 22.43    Triceps Skinfold 11 mm    % Body Fat 20.5 %    Grip Strength 42.9 kg    Flexibility 0 in    Single Leg Stand 53 seconds              Nutrition Therapy Plan and Nutrition Goals:   Nutrition Assessments:  Nutrition Assessments - 10/01/20 1258       MEDFICTS Scores   Pre Score 30  MEDIFICTS Score Key: ?70 Need to make dietary changes  40-70 Heart Healthy Diet ? 40 Therapeutic Level Cholesterol Diet   Picture Your Plate Scores: <47 Unhealthy dietary pattern with much room for improvement. 41-50 Dietary pattern unlikely to meet recommendations for good health and room for improvement. 51-60 More healthful dietary pattern, with some room for improvement.  >60 Healthy dietary pattern, although there may be some specific behaviors that could be improved.    Nutrition Goals Re-Evaluation:   Nutrition Goals Discharge (Final Nutrition Goals Re-Evaluation):   Psychosocial: Target Goals:  Acknowledge presence or absence of significant depression and/or stress, maximize coping skills, provide positive support system. Participant is able to verbalize types and ability to use techniques and skills needed for reducing stress and depression.  Initial Review & Psychosocial Screening:  Initial Psych Review & Screening - 10/01/20 1256       Initial Review   Current issues with Current Stress Concerns    Source of Stress Concerns Financial    Comments Currently on short term disability but it will run out this month. This provides him with just enough money to pay his mortgage.      Family Dynamics   Good Support System? Yes    Comments His wife is his support system. He also has 5 grandchildren.      Barriers   Psychosocial barriers to participate in program The patient should benefit from training in stress management and relaxation.      Screening Interventions   Interventions Encouraged to exercise    Expected Outcomes Short Term goal: Utilizing psychosocial counselor, staff and physician to assist with identification of specific Stressors or current issues interfering with healing process. Setting desired goal for each stressor or current issue identified.;Long Term Goal: Stressors or current issues are controlled or eliminated.;Long Term goal: The participant improves quality of Life and PHQ9 Scores as seen by post scores and/or verbalization of changes             Quality of Life Scores:  Quality of Life - 10/01/20 1445       Quality of Life   Select Quality of Life      Quality of Life Scores   Health/Function Pre 9.75 %    Socioeconomic Pre 15.64 %    Psych/Spiritual Pre 24.29 %    Family Pre 9.75 %    GLOBAL Pre 14.52 %            Scores of 19 and below usually indicate a poorer quality of life in these areas.  A difference of  2-3 points is a clinically meaningful difference.  A difference of 2-3 points in the total score of the Quality of Life Index  has been associated with significant improvement in overall quality of life, self-image, physical symptoms, and general health in studies assessing change in quality of life.  PHQ-9: Recent Review Flowsheet Data     Depression screen [facility] 2/9 10/01/2020   Decreased Interest 0   Down, Depressed, Hopeless 0   PHQ - 2 Score 0   Altered sleeping 1   Tired, decreased energy 1   Change in appetite 0   Feeling bad or failure about yourself  0   Trouble concentrating 0   Moving slowly or fidgety/restless 0   Suicidal thoughts 0   PHQ-9 Score 2   Difficult doing work/chores Not difficult at all      Interpretation of Total Score  Total Score Depression Severity:  1-4 =  Minimal depression, 5-9 = Mild depression, 10-14 = Moderate depression, 15-19 = Moderately severe depression, 20-27 = Severe depression   Psychosocial Evaluation and Intervention:  Psychosocial Evaluation - 10/01/20 1436       Psychosocial Evaluation & Interventions   Interventions Stress management education;Relaxation education;Encouraged to exercise with the program and follow exercise prescription    Comments Pt has barriers that could effect his rehab which include finances and transportation. He was previously unable to do rehab do to his coinsurance. He would have been unable to pay this due to finances. He is currently on short term disability through [facility]. He reports that he has just enough money to pay his mortgage, but he is stressed because the disability runs out this month. Nicolette [employer] will not let him return to work until his life vest is removed. He has an appointment with Dr. Shirlee Latch on 8/31, and he is hopeful that the life vest will be removed then. He was interested in vocational rehab. I called the [facility] office, and they filled out a referral form for him. They stated that they would be calling him today. He is currently smoking about 1/2 pack of cigarettes per day. He has been on  bupropion for about two months and reports that it is not helping. I have given the patient a quit smoking guide by the [employer]. He reports that he is interested in stopping. He scored a 2 on his PHQ-9. He reports that he has a good support system from his wife. His goals while in the program are to decrease his SOB with activity. He is eager to start the program.    Expected Outcomes The patient will successfully stop smoking and stressors will be lessened or eliminated    Continue Psychosocial Services  No Follow up required             Psychosocial Re-Evaluation:   Psychosocial Discharge (Final Psychosocial Re-Evaluation):   Vocational Rehabilitation: Provide vocational rehab assistance to qualifying candidates.   Vocational Rehab Evaluation & Intervention:  Vocational Rehab - 10/01/20 1446       Initial Vocational Rehab Evaluation & Intervention   Assessment shows need for Vocational Rehabilitation Yes    Documents faxed to  Dept of Vocational Rehabilitation 10/01/20   referral form filled out via phone            Education: Education Goals: Education classes will be provided on a weekly basis, covering required topics. Participant will state understanding/return demonstration of topics presented.  Learning Barriers/Preferences:  Learning Barriers/Preferences - 10/01/20 1300       Learning Barriers/Preferences   Learning Barriers Reading    Learning Preferences Individual Instruction;Skilled Demonstration             Education Topics: Hypertension, Hypertension Reduction -Define heart disease and high blood pressure. Discus how high blood pressure affects the body and ways to reduce high blood pressure.   Exercise and Your Heart -Discuss why it is important to exercise, the FITT principles of exercise, normal and abnormal responses to exercise, and how to exercise safely.   Angina -Discuss definition of angina, causes of  angina, treatment of angina, and how to decrease risk of having angina.   Cardiac Medications -Review what the following cardiac medications are used for, how they affect the body, and side effects that may occur when taking the medications.  Medications include Aspirin, Beta blockers, calcium channel blockers, ACE Inhibitors, angiotensin receptor blockers, diuretics,  digoxin, and antihyperlipidemics.   Congestive Heart Failure -Discuss the definition of CHF, how to live with CHF, the signs and symptoms of CHF, and how keep track of weight and sodium intake.   Heart Disease and Intimacy -Discus the effect sexual activity has on the heart, how changes occur during intimacy as we age, and safety during sexual activity.   Smoking Cessation / COPD -Discuss different methods to quit smoking, the health benefits of quitting smoking, and the definition of COPD.   Nutrition I: Fats -Discuss the types of cholesterol, what cholesterol does to the heart, and how cholesterol levels can be controlled.   Nutrition II: Labels -Discuss the different components of food labels and how to read food label   Heart Parts/Heart Disease and PAD -Discuss the anatomy of the heart, the pathway of blood circulation through the heart, and these are affected by heart disease.   Stress I: Signs and Symptoms -Discuss the causes of stress, how stress may lead to anxiety and depression, and ways to limit stress.   Stress II: Relaxation -Discuss different types of relaxation techniques to limit stress.   Warning Signs of Stroke / TIA -Discuss definition of a stroke, what the signs and symptoms are of a stroke, and how to identify when someone is having stroke.   Knowledge Questionnaire Score:  Knowledge Questionnaire Score - 10/01/20 1301       Knowledge Questionnaire Score   Pre Score 24/28             Core Components/Risk Factors/Patient Goals at Admission:  Personal Goals and Risk Factors at  Admission - 10/01/20 1305       Core Components/Risk Factors/Patient Goals on Admission    Weight Management Yes;Weight Maintenance    Intervention Weight Management: Develop a combined nutrition and exercise program designed to reach desired caloric intake, while maintaining appropriate intake of nutrient and fiber, sodium and fats, and appropriate energy expenditure required for the weight goal.;Weight Management: Provide education and appropriate resources to help participant work on and attain dietary goals.    Expected Outcomes Short Term: Continue to assess and modify interventions until short term weight is achieved;Long Term: Adherence to nutrition and physical activity/exercise program aimed toward attainment of established weight goal;Weight Maintenance: Understanding of the daily nutrition guidelines, which includes 25-35% calories from fat, 7% or less cal from saturated fats, less than 200mg  cholesterol, less than 1.5gm of sodium, & 5 or more servings of fruits and vegetables daily    Tobacco Cessation Yes    Number of packs per day 1/2    Intervention Assist the participant in steps to quit. Provide individualized education and counseling about committing to Tobacco Cessation, relapse prevention, and pharmacological support that can be provided by physician.; , assist with locating and accessing local/national Quit Smoking programs, and support quit date choice.    Expected Outcomes Short Term: Will demonstrate readiness to quit, by selecting a quit date.;Short Term: Will quit all tobacco product use, adhering to prevention of relapse plan.;Long Term: Complete abstinence from all tobacco products for at least 12 months from quit date.    Improve shortness of breath with ADL's Yes    Intervention Provide education, individualized exercise plan and daily activity instruction to help decrease symptoms of SOB with activities of daily living.    Expected Outcomes  Short Term: Improve cardiorespiratory fitness to achieve a reduction of symptoms when performing ADLs;Long Term: Be able to perform more ADLs without symptoms or delay the onset  of symptoms    Stress Yes    Intervention Offer individual and/or small group education and counseling on adjustment to heart disease, stress management and health-related lifestyle change. Teach and support self-help strategies.;Refer participants experiencing significant psychosocial distress to appropriate mental health specialists for further evaluation and treatment. When possible, include family members and significant others in education/counseling sessions.    Expected Outcomes Short Term: Participant demonstrates changes in health-related behavior, relaxation and other stress management skills, ability to obtain effective social support, and compliance with psychotropic medications if prescribed.;Long Term: Emotional wellbeing is indicated by absence of clinically significant psychosocial distress or social isolation.             Core Components/Risk Factors/Patient Goals Review:    Core Components/Risk Factors/Patient Goals at Discharge (Final Review):    ITP Comments:   Comments: Patient arrived for 1st visit/orientation/education at 1230. Patient was referred to CR by Dr. Tresa EndoKelly due to ST elevation myocardial infarction involving left anterior descending (LAD) coronary artery (I21.02) and status post coronary artery stent placement (Z95.5). During orientation advised patient on arrival and appointment times what to wear, what to do before, during and after exercise. Reviewed attendance and class policy.  Pt is scheduled to return Cardiac Rehab on 10/03/20 at 1100. Pt was advised to come to class 15 minutes before class starts.  Discussed RPE/Dpysnea scales. Patient participated in warm up stretches. Patient was able to complete 6 minute walk test.  Telemetry:NSR. Patient was measured for the equipment. Discussed  equipment safety with patient. Took patient pre-anthropometric measurements. Patient finished visit at 1400.

## 2020-10-01 NOTE — Telephone Encounter (Signed)
See referral notes. I have spoken with pt and scheduled him for lung cancer screening.

## 2020-10-01 NOTE — Progress Notes (Signed)
Cardiac/Pulmonary Rehab Medication Review by a Pharmacist  Does the patient  feel that his/her medications are working for him/her?  Feels like bupropion not working for smoking cessation.  Has the patient been experiencing any side effects to the medications prescribed?  no  Does the patient measure his/her own blood pressure or blood glucose at home?  no   Does the patient have any problems obtaining medications due to transportation or finances?   no  Understanding of regimen: good Understanding of indications: good Potential of compliance: good  Questions asked to Determine Patient Understanding of Medication Regimen:  1. What is the name of the medication?  2. What is the medication used for?  3. When should it be taken?  4. How much should be taken?  5. How will you take it?  6. What side effects should you report?  Understanding Defined as: Excellent: All questions above are correct Good: Questions 1-4 are correct Fair: Questions 1-2 are correct  Poor: 1 or none of the above questions are correct   Pharmacist comments:  Overall, patient has good understanding of medications and high compliance.  Discussed smoking cessation with patient and how he has been on bupropion SR for 2 months without improvement.     Tad Moore 10/01/2020 1:25 PM

## 2020-10-02 ENCOUNTER — Ambulatory Visit (HOSPITAL_COMMUNITY)
Admission: RE | Admit: 2020-10-02 | Discharge: 2020-10-02 | Disposition: A | Discharge status: Home / Self Care | Payer: BC Managed Care – PPO | Source: Ambulatory Visit | Attending: Internal Medicine | Admitting: Internal Medicine

## 2020-10-02 ENCOUNTER — Encounter (HOSPITAL_COMMUNITY): Payer: BC Managed Care – PPO

## 2020-10-02 ENCOUNTER — Encounter: Payer: Self-pay | Admitting: *Deleted

## 2020-10-02 DIAGNOSIS — R0600 Dyspnea, unspecified: Secondary | ICD-10-CM

## 2020-10-02 DIAGNOSIS — R0609 Other forms of dyspnea: Secondary | ICD-10-CM

## 2020-10-02 LAB — PULMONARY FUNCTION TEST
DL/VA % pred: 59 %
DL/VA: 2.54 ml/min/mmHg/L
DLCO unc % pred: 60 %
DLCO unc: 17.62 ml/min/mmHg
FEF 25-75 Post: 3.01 L/sec
FEF 25-75 Pre: 2.83 L/sec
FEF2575-%Change-Post: 6 %
FEF2575-%Pred-Post: 93 %
FEF2575-%Pred-Pre: 87 %
FEV1-%Change-Post: 1 %
FEV1-%Pred-Post: 98 %
FEV1-%Pred-Pre: 96 %
FEV1-Post: 3.77 L
FEV1-Pre: 3.72 L
FEV1FVC-%Change-Post: 2 %
FEV1FVC-%Pred-Pre: 95 %
FEV6-%Change-Post: 0 %
FEV6-%Pred-Post: 103 %
FEV6-%Pred-Pre: 103 %
FEV6-Post: 5.01 L
FEV6-Pre: 5.01 L
FEV6FVC-%Change-Post: 1 %
FEV6FVC-%Pred-Post: 104 %
FEV6FVC-%Pred-Pre: 102 %
FVC-%Change-Post: -1 %
FVC-%Pred-Post: 99 %
FVC-%Pred-Pre: 101 %
FVC-Post: 5.02 L
FVC-Pre: 5.1 L
Post FEV1/FVC ratio: 75 %
Post FEV6/FVC ratio: 100 %
Pre FEV1/FVC ratio: 73 %
Pre FEV6/FVC Ratio: 98 %
RV % pred: 105 %
RV: 2.36 L
TLC % pred: 103 %
TLC: 7.41 L

## 2020-10-02 MED ORDER — ALBUTEROL SULFATE (2.5 MG/3ML) 0.083% IN NEBU
2.5000 mg | INHALATION_SOLUTION | Freq: Once | RESPIRATORY_TRACT | Status: AC
  Administered 2020-10-02: 2.5 mg via RESPIRATORY_TRACT

## 2020-10-03 ENCOUNTER — Other Ambulatory Visit: Payer: Self-pay

## 2020-10-03 ENCOUNTER — Encounter (HOSPITAL_COMMUNITY)
Admission: RE | Admit: 2020-10-03 | Discharge: 2020-10-03 | Disposition: A | Discharge status: Home / Self Care | Payer: BC Managed Care – PPO | Source: Ambulatory Visit | Attending: Cardiovascular Disease | Admitting: Cardiovascular Disease

## 2020-10-03 DIAGNOSIS — I2102 ST elevation (STEMI) myocardial infarction involving left anterior descending coronary artery: Secondary | ICD-10-CM | POA: Diagnosis not present

## 2020-10-03 DIAGNOSIS — Z955 Presence of coronary angioplasty implant and graft: Secondary | ICD-10-CM

## 2020-10-03 NOTE — Progress Notes (Signed)
Daily Session Note  Patient Details  Name: AVIGDOR DOLLAR MRN: [id number] Date of Birth: 1965-10-28 Referring Provider:   Flowsheet Row CARDIAC REHAB PHASE II ORIENTATION from 10/01/2020 in [place]  Referring Provider Dr. Claiborne Billings       Encounter Date: 10/03/2020  Check In:  Session Check In - 10/03/20 1100       Check-In   Supervising physician immediately available to respond to emergencies [facility] MD immediately available    Physician(s) Dr. Domenic Polite    Location [facility]    Staff Present Hoy Register, MS, ACSM-CEP, Exercise Physiologist;Debra Wynetta Emery, RN, BSN    Virtual Visit No    Medication changes reported     No    Fall or balance concerns reported    No    Tobacco Cessation No Change    Warm-up and Cool-down Performed as group-led instruction    Resistance Training Performed Yes    VAD Patient? No    PAD/SET Patient? No      Pain Assessment   Currently in Pain? No/denies    Multiple Pain Sites No             Capillary Blood Glucose: No results found for this or any previous visit (from the past 24 hour(s)).    Social History   Tobacco Use  Smoking Status Light Smoker   Packs/day: 3.00   Years: 51.00   Pack years: 153.00   Types: Cigarettes  Smokeless Tobacco Never  Tobacco Comments   Currently 6-8 cigs per day    Goals Met:  Independence with exercise equipment Exercise tolerated well No report of cardiac concerns or symptoms Strength training completed today  Goals Unmet:  Not Applicable  Comments: checkout time is 1200   Dr. Kathie Dike is Medical Director for [facility]monary Re[facility].

## 2020-10-05 ENCOUNTER — Encounter (HOSPITAL_COMMUNITY)
Admission: RE | Admit: 2020-10-05 | Discharge: 2020-10-05 | Disposition: A | Discharge status: Home / Self Care | Payer: BC Managed Care – PPO | Source: Ambulatory Visit | Attending: Cardiovascular Disease | Admitting: Cardiovascular Disease

## 2020-10-05 ENCOUNTER — Other Ambulatory Visit: Payer: Self-pay

## 2020-10-05 DIAGNOSIS — I2102 ST elevation (STEMI) myocardial infarction involving left anterior descending coronary artery: Secondary | ICD-10-CM | POA: Diagnosis not present

## 2020-10-05 DIAGNOSIS — Z955 Presence of coronary angioplasty implant and graft: Secondary | ICD-10-CM

## 2020-10-05 NOTE — Progress Notes (Signed)
Daily Session Note  Patient Details  Name: [name] MRN: [id number] Date of Birth: 01-Dec-1965 Referring Provider:   Flowsheet Row CARDIAC REHAB PHASE II ORIENTATION from 10/01/2020 in [place]  Referring Provider Dr. Claiborne Billings       Encounter Date: 10/05/2020  Check In:  Session Check In - 10/05/20 1100       Check-In   Supervising physician immediately available to respond to emergencies [facility] immediately available    Physician(s) Dr. Harl Bowie    Location [facility]    Staff Present Hoy Register, MS, ACSM-CEP, Exercise Physiologist;Debra Wynetta Emery, RN, BSN    Virtual Visit No    Medication changes reported     No    Fall or balance concerns reported    No    Tobacco Cessation No Change    Warm-up and Cool-down Performed as group-led instruction    Resistance Training Performed Yes    VAD Patient? No    PAD/SET Patient? No      Pain Assessment   Currently in Pain? No/denies    Multiple Pain Sites No             Capillary Blood Glucose: No results found for this or any previous visit (from the past 24 hour(s)).    Social History   Tobacco Use  Smoking Status Light Smoker   Packs/day: 3.00   Years: 51.00   Pack years: 153.00   Types: Cigarettes  Smokeless Tobacco Never  Tobacco Comments   Currently 6-8 cigs per day    Goals Met:  Independence with exercise equipment Exercise tolerated well No report of cardiac concerns or symptoms Strength training completed today  Goals Unmet:  Not Applicable  Comments: checkout time is 1200   Dr. Kathie Dike is Medical Director for [facility] Pulmonary Rehab.

## 2020-10-08 ENCOUNTER — Other Ambulatory Visit: Payer: Self-pay

## 2020-10-08 ENCOUNTER — Encounter (HOSPITAL_COMMUNITY)
Admission: RE | Admit: 2020-10-08 | Discharge: 2020-10-08 | Disposition: A | Discharge status: Home / Self Care | Payer: BC Managed Care – PPO | Source: Ambulatory Visit | Attending: Cardiovascular Disease | Admitting: Cardiovascular Disease

## 2020-10-08 DIAGNOSIS — I2102 ST elevation (STEMI) myocardial infarction involving left anterior descending coronary artery: Secondary | ICD-10-CM

## 2020-10-08 DIAGNOSIS — Z955 Presence of coronary angioplasty implant and graft: Secondary | ICD-10-CM

## 2020-10-08 NOTE — Progress Notes (Signed)
Daily Session Note  Patient Details  Name: [name] MRN: [id number] Date of Birth: 05/05/1965 Referring Provider:   Flowsheet Row CARDIAC REHAB PHASE II ORIENTATION from 10/01/2020 in [place] CARD[facility]  Referring Provider Dr. Kelly       Encounter Date: 10/08/2020  Check In:  Session Check In - 10/08/20 1100       Check-In   Supervising physician immediately available to respond to emergencies [facility] immediately available    Physician(s) Dr. Nishan    Location [facility]    Staff Present Dalton Fletcher, MS, ACSM-CEP, Exercise Physiologist;Debra Johnson, RN, BSN    Virtual Visit No    Medication changes reported     No    Fall or balance concerns reported    No    Tobacco Cessation No Change    Warm-up and Cool-down Performed as group-led instruction    Resistance Training Performed Yes    VAD Patient? No    PAD/SET Patient? No      Pain Assessment   Currently in Pain? No/denies    Multiple Pain Sites No             Capillary Blood Glucose: No results found for this or any previous visit (from the past 24 hour(s)).    Social History   Tobacco Use  Smoking Status Light Smoker   Packs/day: 3.00   Years: 51.00   Pack years: 153.00   Types: Cigarettes  Smokeless Tobacco Never  Tobacco Comments   Currently 6-8 cigs per day    Goals Met:  Independence with exercise equipment Exercise tolerated well No report of cardiac concerns or symptoms Strength training completed today  Goals Unmet:  Not Applicable  Comments: checkout time is 1200   Dr. Jehanzeb Memon is Medical Director for  Pulmonary Rehab. 

## 2020-10-10 ENCOUNTER — Emergency Department (HOSPITAL_COMMUNITY): Payer: BC Managed Care – PPO

## 2020-10-10 ENCOUNTER — Encounter (HOSPITAL_COMMUNITY): Payer: Self-pay | Admitting: Emergency Medicine

## 2020-10-10 ENCOUNTER — Encounter (HOSPITAL_COMMUNITY)
Admission: RE | Admit: 2020-10-10 | Discharge: 2020-10-10 | Disposition: A | Discharge status: Home / Self Care | Payer: BC Managed Care – PPO | Source: Ambulatory Visit | Attending: Cardiovascular Disease | Admitting: Cardiovascular Disease

## 2020-10-10 ENCOUNTER — Emergency Department (HOSPITAL_COMMUNITY)
Admission: EM | Admit: 2020-10-10 | Discharge: 2020-10-10 | Disposition: A | Discharge status: Home / Self Care | Payer: BC Managed Care – PPO | Attending: Emergency Medicine | Admitting: Emergency Medicine

## 2020-10-10 ENCOUNTER — Telehealth: Payer: Self-pay | Admitting: Internal Medicine

## 2020-10-10 ENCOUNTER — Other Ambulatory Visit: Payer: Self-pay

## 2020-10-10 DIAGNOSIS — R0609 Other forms of dyspnea: Secondary | ICD-10-CM

## 2020-10-10 DIAGNOSIS — Z5321 Procedure and treatment not carried out due to patient leaving prior to being seen by health care provider: Secondary | ICD-10-CM | POA: Insufficient documentation

## 2020-10-10 DIAGNOSIS — R0602 Shortness of breath: Secondary | ICD-10-CM | POA: Diagnosis present

## 2020-10-10 DIAGNOSIS — Z955 Presence of coronary angioplasty implant and graft: Secondary | ICD-10-CM

## 2020-10-10 DIAGNOSIS — R0789 Other chest pain: Secondary | ICD-10-CM | POA: Insufficient documentation

## 2020-10-10 DIAGNOSIS — R0600 Dyspnea, unspecified: Secondary | ICD-10-CM

## 2020-10-10 DIAGNOSIS — I2102 ST elevation (STEMI) myocardial infarction involving left anterior descending coronary artery: Secondary | ICD-10-CM

## 2020-10-10 NOTE — Telephone Encounter (Signed)
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 Sherene Sires. Pt verbalized understanding and denied any questions. Pt is ok with referral and I have sent order to [facility].

## 2020-10-10 NOTE — Progress Notes (Signed)
Incomplete Session Note  Patient Details  Name: [name] MRN: [id number] Date of Birth: Apr 10, 1965 Referring Provider:   Flowsheet Row CARDIAC REHAB PHASE II ORIENTATION from 10/01/2020 in [facility] CARDIAC REHABILITATION  Referring Provider Dr. [name] did not complete his rehab session.  He arrived for rehab and participated in the warm up. During the beginning of the weights portion of the warm up, he had to sit down. He reported that he felt light headed, short of breath, and was having chest tightness. After sitting for several minutes, he still was short of breath. He was transported via wheelchair to the emergency department.

## 2020-10-10 NOTE — Telephone Encounter (Signed)
Pt is returning phone call in regards to PFT results. Pls regard; [phone].

## 2020-10-10 NOTE — ED Triage Notes (Signed)
Pt was in cardiac rehab and was warming up. Pt became short of breath and chest tightness.  Pt has an EF of 40 and had a stemi in February 2020.

## 2020-10-10 NOTE — Progress Notes (Signed)
LMTCB

## 2020-10-12 ENCOUNTER — Encounter (HOSPITAL_COMMUNITY)
Admission: RE | Admit: 2020-10-12 | Discharge: 2020-10-12 | Disposition: A | Discharge status: Home / Self Care | Payer: BC Managed Care – PPO | Source: Ambulatory Visit | Attending: Cardiovascular Disease | Admitting: Cardiovascular Disease

## 2020-10-12 ENCOUNTER — Other Ambulatory Visit: Payer: Self-pay

## 2020-10-12 DIAGNOSIS — I2102 ST elevation (STEMI) myocardial infarction involving left anterior descending coronary artery: Secondary | ICD-10-CM

## 2020-10-12 DIAGNOSIS — Z955 Presence of coronary angioplasty implant and graft: Secondary | ICD-10-CM

## 2020-10-12 NOTE — Progress Notes (Signed)
Daily Session Note  Patient Details  Name: [name] MRN: [id number] Date of Birth: 09/12/1965 Referring Provider:   Flowsheet Row CARDIAC REHAB PHASE II ORIENTATION from 10/01/2020 in [place] CARDIAC REHABILITATION  Referring Provider Dr. Kelly       Encounter Date: 10/12/2020  Check In:  Session Check In - 10/12/20 1100       Check-In   Supervising physician immediately available to respond to emergencies [facility] immediately available    Physician(s) Dr. Ross    Location [facility]    Staff Present Ritu Gagliardo, MS, ACSM-CEP, Exercise Physiologist;Other    Virtual Visit No    Medication changes reported     No    Fall or balance concerns reported    No    Tobacco Cessation No Change    Warm-up and Cool-down Performed as group-led instruction    Resistance Training Performed Yes    VAD Patient? No    PAD/SET Patient? No      Pain Assessment   Currently in Pain? No/denies    Pain Score 0-No pain    Multiple Pain Sites No             Capillary Blood Glucose: No results found for this or any previous visit (from the past 24 hour(s)).    Social History   Tobacco Use  Smoking Status Light Smoker   Packs/day: 0.50   Years: 51.00   Pack years: 25.50   Types: Cigarettes  Smokeless Tobacco Never  Tobacco Comments   Currently 6-8 cigs per day    Goals Met:  Independence with exercise equipment Exercise tolerated well No report of cardiac concerns or symptoms Strength training completed today  Goals Unmet:  Not Applicable  Comments: checkout time is 1200   Dr. Jehanzeb Memon is Medical Director for  Pulmonary Rehab. 

## 2020-10-12 NOTE — Progress Notes (Signed)
Daily Session Note  Patient Details  Name: SAIVON PROWSE MRN: [id number] Date of Birth: 1965-05-07 Referring Provider:   Flowsheet Row CARDIAC REHAB PHASE II ORIENTATION from 10/01/2020 in [place]  Referring Provider Dr. Claiborne Billings       Encounter Date: 10/12/2020  Check In:  Session Check In - 10/12/20 1100       Check-In   Supervising physician immediately available to respond to emergencies [facility] immediately available    Physician(s) Dr. Harrington Challenger    Location [facility]    Staff Present Hoy Register, MS, ACSM-CEP, Exercise Physiologist;Other    Virtual Visit No    Medication changes reported     No    Fall or balance concerns reported    No    Tobacco Cessation No Change    Warm-up and Cool-down Performed as group-led instruction    Resistance Training Performed Yes    VAD Patient? No    PAD/SET Patient? No      Pain Assessment   Currently in Pain? No/denies    Pain Score 0-No pain    Multiple Pain Sites No             Capillary Blood Glucose: No results found for this or any previous visit (from the past 24 hour(s)).    Social History   Tobacco Use  Smoking Status Light Smoker   Packs/day: 0.50   Years: 51.00   Pack years: 25.50   Types: Cigarettes  Smokeless Tobacco Never  Tobacco Comments   Currently 6-8 cigs per day    Goals Met:  Independence with exercise equipment Exercise tolerated well No report of cardiac concerns or symptoms Strength training completed today  Goals Unmet:  Not Applicable  Comments: checkout time is 1200   Dr. Kathie Dike is Medical Director for [facility] Pulmonary Rehab.

## 2020-10-15 ENCOUNTER — Encounter (HOSPITAL_COMMUNITY)
Admission: RE | Admit: 2020-10-15 | Discharge: 2020-10-15 | Disposition: A | Discharge status: Home / Self Care | Payer: BC Managed Care – PPO | Source: Ambulatory Visit | Attending: Cardiovascular Disease | Admitting: Cardiovascular Disease

## 2020-10-15 ENCOUNTER — Other Ambulatory Visit: Payer: Self-pay

## 2020-10-15 VITALS — Wt 163.8 lb

## 2020-10-15 DIAGNOSIS — I2102 ST elevation (STEMI) myocardial infarction involving left anterior descending coronary artery: Secondary | ICD-10-CM

## 2020-10-15 DIAGNOSIS — Z955 Presence of coronary angioplasty implant and graft: Secondary | ICD-10-CM

## 2020-10-15 NOTE — Progress Notes (Signed)
Daily Session Note  Patient Details  Name: [name] MRN: [id number] Date of Birth: 1965-09-02 Referring Provider:   Flowsheet Row CARDIAC REHAB PHASE II ORIENTATION from 10/01/2020 in [place]  Referring Provider Dr. Claiborne Billings       Encounter Date: 10/15/2020  Check In:  Session Check In - 10/15/20 1100       Check-In   Supervising physician immediately available to respond to emergencies [facility] immediately available    Physician(s) Dr. Domenic Polite    Location [facility]    Staff Present Hoy Register, MS, ACSM-CEP, Exercise Physiologist;Other    Virtual Visit No    Medication changes reported     No    Fall or balance concerns reported    No    Tobacco Cessation No Change    Warm-up and Cool-down Performed as group-led instruction    Resistance Training Performed Yes    VAD Patient? No    PAD/SET Patient? No      Pain Assessment   Currently in Pain? No/denies    Pain Score 0-No pain    Multiple Pain Sites No             Capillary Blood Glucose: No results found for this or any previous visit (from the past 24 hour(s)).    Social History   Tobacco Use  Smoking Status Light Smoker   Packs/day: 0.50   Years: 51.00   Pack years: 25.50   Types: Cigarettes  Smokeless Tobacco Never  Tobacco Comments   Currently 6-8 cigs per day    Goals Met:  Independence with exercise equipment Exercise tolerated well No report of cardiac concerns or symptoms Strength training completed today  Goals Unmet:  Not Applicable  Comments: checkout time is 1200   Dr. Kathie Dike is Medical Director for [facility] Pulmonary Rehab.

## 2020-10-17 ENCOUNTER — Other Ambulatory Visit: Payer: Self-pay

## 2020-10-17 ENCOUNTER — Encounter (HOSPITAL_COMMUNITY)
Admission: RE | Admit: 2020-10-17 | Discharge: 2020-10-17 | Disposition: A | Discharge status: Home / Self Care | Payer: BC Managed Care – PPO | Source: Ambulatory Visit | Attending: Cardiovascular Disease | Admitting: Cardiovascular Disease

## 2020-10-17 DIAGNOSIS — Z955 Presence of coronary angioplasty implant and graft: Secondary | ICD-10-CM

## 2020-10-17 DIAGNOSIS — I2102 ST elevation (STEMI) myocardial infarction involving left anterior descending coronary artery: Secondary | ICD-10-CM

## 2020-10-17 NOTE — Progress Notes (Signed)
Daily Session Note  Patient Details  Name: MARQUS MACPHEE MRN: [id number] Date of Birth: 01-16-1966 Referring Provider:   Flowsheet Row CARDIAC REHAB PHASE II ORIENTATION from 10/01/2020 in [place]  Referring Provider Dr. Claiborne Billings       Encounter Date: 10/17/2020  Check In:  Session Check In - 10/17/20 1100       Check-In   Supervising physician immediately available to respond to emergencies [facility] immediately available    Physician(s) Dr. Harl Bowie    Location [facility]    Staff Present Aundra Dubin, RN, BSN;Other    Virtual Visit No    Medication changes reported     No    Fall or balance concerns reported    No    Tobacco Cessation No Change    Warm-up and Cool-down Performed as group-led instruction    Resistance Training Performed Yes    VAD Patient? No    PAD/SET Patient? No      Pain Assessment   Currently in Pain? No/denies    Pain Score 0-No pain    Multiple Pain Sites No             Capillary Blood Glucose: No results found for this or any previous visit (from the past 24 hour(s)).    Social History   Tobacco Use  Smoking Status Light Smoker   Packs/day: 0.50   Years: 51.00   Pack years: 25.50   Types: Cigarettes  Smokeless Tobacco Never  Tobacco Comments   Currently 6-8 cigs per day    Goals Met:  Independence with exercise equipment Exercise tolerated well No report of cardiac concerns or symptoms Strength training completed today  Goals Unmet:  Not Applicable  Comments: Check out 1200.   Dr. Kathie Dike is Medical Director for [facility] Pulmonary Rehab.

## 2020-10-17 NOTE — Progress Notes (Signed)
Cardiology Office Note  Date: 10/18/2020   ID: Eric Beamrnest E Velasquez, DOB 05/06/1965, MRN [id number]  PCP:  Pcp, No  Cardiologist:  None Electrophysiologist:  None   Chief Complaint: 4338-month follow-up  History of Present Illness: [name] is a 55 y.o. male with a history of CAD, STEMI, acute systolic heart failure, hyperlipidemia, history of tobacco abuse, history of methamphetamine abuse, polysubstance abuse, smoker  Previous history of CAD with DES to LAD in 2009.  He presented to [facility] on 04/22/2020 with complaints of chest pain the prior night described as intermittent.  Early the next morning the chest pain became more consistent and was associated with nausea, vomiting and shortness of breath.  Described the pain as crushing precordial chest pain.  He was slightly diaphoretic.  EKG demonstrated ST elevation in V2, V3, V4.  Subsequent EKG 15 to 20 minutes later demonstrated persistent ST elevation in V3 and V4.  [facility].  Initial troponin was 7691.  Troponin peaked at 27,000.  [facility] EMS [facility].  En route he received 2 doses of sublingual nitroglycerin, 250 mL of normal saline, 324 mg of aspirin, 4 mg morphine, 4 mg Zofran, heparin 4000 units.  He he underwent cardiac catheterization on 04/22/2020 demonstrating 20% second diagonal lesion, mid LAD to distal LAD lesion 100%, DES to LAD.  Post intervention there was 0% residual stenosis.  Mid LAD lesion 20%.  LVEF 25 to 35% by visual estimate LVEDP mildly elevated.  DAPT therapy for minimum of 12 months but recommended longer duration with aspirin and Brilinta.  His EF was noted to be 25 to 30% , grade 1 diastolic dysfunction with akinesis of the mid/distal and apical LV apex.  No evidence of volume overload.  He was started on Coreg 3.125 mg p.o. twice daily.  Losartan 12.5 mg p.o. daily and spironolactone 12.5 mg p.o. daily.  He was started on atorvastatin 80 mg  daily.  His LDL was 89.  He was counseled on smoking cessation.  He had nonsustained VT which improved with low-dose Coreg at 3.125 mg p.o. twice daily.  He was fitted for a LifeVest.  [facility] due to soft blood pressures.  Marcelline DeistFarxiga was added prior to discharge.  Was last here for follow-up status post STEMI.  He was wearing a LifeVest.  He denied any discharges on LifeVest.  Had some mild left precordial chest pain at times.  Stated the pain did not feel like the symptoms he was having prior to her MI.  Stated symptoms were primarily of feeling as though he had indigestion prior to presentation for MI.  He stated since discharge he had not stopped smoking but cut way down on smoking.  He was smoking about a half a pack of cigarettes per day versus 3 packs before MI.  He did have some mild shortness of breath when walking short distances.  He denies any orthostatic symptoms, CVA or TIA-like symptoms, palpitations or arrhythmias, PND, orthopnea.  Denied any bleeding.  Denied any claudication-like symptoms, DVT or PE-like symptoms, or lower extremity edema.  Denied any issues with his right radial access site.  He states he is compliant with all of his medications.  He had an upcoming appointment with Dr. Shirlee LatchMcLean at advanced heart failure clinic on May 27.  He saw Dr. Shirlee LatchMcLean on 07/27/2020.  He was continuing Coreg 3.125  mg p.o. twice daily, digoxin 0.125 mg daily, losartan 25 mg daily.  Dr. Shirlee Latch held off on starting [facility] during that visit due to low blood pressure.  Plan was to increase spironolactone to 25 mg daily if potassium was okay on BMP.  He was continuing [place] 10 mg daily.  He was continuing life vest.  Plan was if EF remains less than 35% would need ICD.  He was continuing aspirin 81 mg and Plavix 75 mg daily.  He was continued atorvastatin 80 mg daily.  Smoking cessation was discussed.  He was started on Wellbutrin for smoking cessation.   Plans were to check PFTs.  Dr. Shirlee Latch wondered if some of his current exertional dyspnea was from COPD.  Plan was if follow-up echo showed EF less than 35% to refer to EP for ICD.  He is here for 9-month follow-up today and his recent follow-up echocardiogram showed improvement in EF to 40 to 45% from previous EF 25 to 30% in February.  He continues to wear his LifeVest.  He states I will be glad when I can stop wearing the LifeVest.  He denies any particular issues now.  He continues to smoke states he is cutting down on the amount of cigarettes he is smoking.  He is using nicotine gum to help.  His current cardiac regimen includes; aspirin 81 mg daily, atorvastatin 80 mg daily(Recent follow-up FLP on 07/27/2020 demonstrated LDL of 45.),  Carvedilol 3.125 mg p.o. twice daily, Plavix 75 mg daily, Farxiga 10 mg daily, digoxin 0.125 mg p.o. daily.  Losartan 25 mg daily, sublingual nitroglycerin as needed, spironolactone 25 mg daily.  He states he has an occasional twinge of chest pain.  Denies any significant DOE or SOB.  States he had an episode about 2 weeks ago during rehab where he felt a little bit short of breath but has not had no issues since that time.  He denies any PND or orthopnea.  No palpitations or arrhythmias.  No LifeVest shocks.  Denies any CVA or TIA-like symptoms.  Denies any bleeding.  No lower extremity edema.    Past Medical History:  Diagnosis Date   Coronary arteriosclerosis    a. PCI LAD 2009 in [place];  b. 12/2010 Cath: LM: nl, LAD: patent stent mid, 25 mid, LCX nl, RCA: non dominant, nl, EF 50%   History of methamphetamine abuse (HCC)    a. Used x 6 yrs, quit July 2012   History of tobacco abuse    a. 41 yrs, up to 3 ppd, quit July 2012   Hypercholesterolemia     Past Surgical History:  Procedure Laterality Date   BACK SURGERY     fusion-lumbar   CORONARY ANGIOPLASTY WITH STENT PLACEMENT     CORONARY/GRAFT ACUTE MI REVASCULARIZATION N/A 04/22/2020   Procedure:  Coronary/Graft Acute MI Revascularization;  Surgeon: Lennette Bihari, MD;  Location: [facility] INVASIVE CV LAB;  Service: Cardiovascular;  Laterality: N/A;   LEFT HEART CATH AND CORONARY ANGIOGRAPHY N/A 04/22/2020   Procedure: LEFT HEART CATH AND CORONARY ANGIOGRAPHY;  Surgeon: Lennette Bihari, MD;  Location: [facility] INVASIVE CV LAB;  Service: Cardiovascular;  Laterality: N/A;    Current Outpatient Medications  Medication Sig Dispense Refill   aspirin EC 81 MG tablet Take 1 tablet (81 mg total) by mouth daily. 90 tablet 3   atorvastatin (LIPITOR) 80 MG tablet Take 1 tablet (80 mg total) by mouth daily. 90 tablet 3   buPROPion (WELLBUTRIN SR) 150 MG 12 hr  tablet Take 1 tablet (150 mg total) by mouth 2 (two) times daily. 60 tablet 2   carvedilol (COREG) 3.125 MG tablet Take 1 tablet (3.125 mg total) by mouth 2 (two) times daily with a meal. 180 tablet 3   clopidogrel (PLAVIX) 75 MG tablet Take 1 tablet (75 mg total) by mouth daily. 90 tablet 3   dapagliflozin propanediol (FARXIGA) 10 MG TABS tablet Take 1 tablet (10 mg total) by mouth daily. 90 tablet 3   digoxin (LANOXIN) 0.125 MG tablet Take 1 tablet (0.125 mg total) by mouth daily. 90 tablet 3   losartan (COZAAR) 25 MG tablet Take 1 tablet (25 mg total) by mouth daily. 90 tablet 3   nitroGLYCERIN (NITROSTAT) 0.4 MG SL tablet Place 1 tablet (0.4 mg total) under the tongue every 5 (five) minutes as needed. 25 tablet 3   spironolactone (ALDACTONE) 25 MG tablet Take 1 tablet (25 mg total) by mouth daily. 90 tablet 3   No current facility-administered medications for this visit.   Allergies:  Patient has no known allergies.   Social History: The patient  reports that he has been smoking cigarettes. He has a 25.50 pack-year smoking history. He has never used smokeless tobacco. He reports that he does not currently use drugs after having used the following drugs: Methamphetamines. He reports that he does not drink alcohol.   Family History: The patient's family  history includes Coronary artery disease in his mother; Heart attack in his father.   ROS:  Please see the history of present illness. Otherwise, complete review of systems is positive for none.  All other systems are reviewed and negative.   Physical Exam: VS:  BP 118/68   Pulse 68   Ht 5\' 11"  (1.803 m)   Wt 161 lb 8 oz (73.3 kg)   SpO2 97%   BMI 22.52 kg/m , BMI Body mass index is 22.52 kg/m.  Wt Readings from Last 3 Encounters:  10/18/20 161 lb 8 oz (73.3 kg)  10/15/20 163 lb 12.8 oz (74.3 kg)  10/10/20 160 lb 11.5 oz (72.9 kg)    General: Patient appears comfortable at rest. Neck: Supple, no elevated JVP or carotid bruits, no thyromegaly. Lungs: Clear to auscultation, nonlabored breathing at rest. Cardiac: Regular rate and rhythm, no S3 or significant systolic murmur, no pericardial rub. Extremities: No pitting edema, distal pulses 2+. Skin: Warm and dry.  Right radial access site clean and dry with 2+ pulses Musculoskeletal: No kyphosis. Neuropsychiatric: Alert and oriented x3, affect grossly appropriate.  ECG:  EKG May 22, 2020 normal sinus rhythm rate of 66, anteroseptal infarct, age undetermined, T wave abnormality consider lateral ischemia.  Recent Labwork: 04/22/2020: ALT 27; AST 66 04/23/2020: Hemoglobin 12.5; Platelets 256 07/27/2020: B Natriuretic Peptide 29.6; BUN 11; Creatinine, Ser 0.87; Potassium 4.3; Sodium 138     Component Value Date/Time   CHOL 120 07/27/2020 1458   TRIG 221 (H) 07/27/2020 1458   HDL 31 (L) 07/27/2020 1458   CHOLHDL 3.9 07/27/2020 1458   VLDL 44 (H) 07/27/2020 1458   LDLCALC 45 07/27/2020 1458    Other Studies Reviewed Today:  Echocardiogram 09/11/2020  1. Left ventricular ejection fraction, by estimation, is 40 to 45%. The left ventricle has mildly decreased function. The left ventricle demonstrates regional wall motion abnormalities (see scoring diagram/findings for description). Left ventricular diastolic parameters are  consistent with Grade I diastolic dysfunction (impaired relaxation). 2. Right ventricular systolic function is normal. The right ventricular size is normal. There is normal  pulmonary artery systolic pressure. The estimated right ventricular systolic pressure is 19.0 mmHg. 3. The mitral valve is grossly normal. No evidence of mitral valve regurgitation. 4. The aortic valve is tricuspid. Aortic valve regurgitation is not visualized. 5. The inferior vena cava is normal in size with greater than 50% respiratory variability, suggesting right atrial pressure of 3 mmHg. Comparison(s): Echocardiogram done 04/23/20 showed an EF of 25-30%.   Cath: 04/22/20   2nd Diag lesion is 20% stenosed. Mid LAD to Dist LAD lesion is 100% stenosed. Post intervention, there is a 0% residual stenosis. Mid LAD lesion is 20% stenosed. A stent was successfully placed. The left ventricular ejection fraction is 25-35% by visual estimate. LV end diastolic pressure is mildly elevated. There is severe left ventricular systolic dysfunction.   Acute anterolateral myocardial infarction secondary to total occlusion of the LAD immediately proximal to the previously placed mid LAD stent with initial TIMI 0 flow.   No significant concomitant CAD with a normal ramus intermediate, a large dominant normal left circumflex vessel, and a normal nondominant RCA.   Successful PCI to the LAD with ultimate insertion of a 3.0 x 26 mm Resolute stent postdilated with taper from 3.2-3.62mm with residual stenosis being reduced to 0%.  There is resumption of brisk TIMI-3 flow.  There is no change in the mild 20% stenosis proximal to the second diagonal vessel and 20% ostial second diagonal stenosis.   Severe LV dysfunction with severe hypocontractility involving the mid, distal anterolateral wall extending around the apex and involving the apical inferior segment with EF estimated at approximately 25 -30% acutely.  LVEDP 19 mm.    RECOMMENDATION: DAPT for minimum of 12 months but recommend longer duration.  Guideline directed medical therapy for LV dysfunction with initiation of carvedilol, with subsequent ARB and potential transition to Entresto, aldosterone blockade and possible SGLT2 inhibition depending upon LV function recovery.  Aggressive lipid-lowering therapy with target LDL less than 70.  Smoking cessation.   Diagnostic Dominance: Left      Intervention        Echo: 04/23/20   IMPRESSIONS     1. LVEF is severely depressed There is akinesis of the mid, distal and  apical LV apex      Global longitudinal strain is -8.2%. Left ventricular ejection  fraction, by estimation, is 25 to 30%. The left ventricle has severely  decreased function. Left ventricular diastolic parameters are consistent  with Grade I diastolic dysfunction  (impaired relaxation).   2. Right ventricular systolic function is normal. The right ventricular  size is normal. There is normal pulmonary artery systolic pressure.   3. The mitral valve is normal in structure. Trivial mitral valve  regurgitation.   4. The aortic valve is abnormal. Aortic valve regurgitation is not  visualized. Mild to moderate aortic valve sclerosis/calcification is  present, without any evidence of aortic stenosis.   5. The inferior vena cava is normal in size with greater than 50%  respiratory variability, suggesting right atrial pressure of 3 mmHg.  _____________   Assessment and Plan:  1. CAD in native artery   2. HFrEF (heart failure with reduced ejection fraction) (HCC)   3. Mixed hyperlipidemia   4. Tobacco abuse   5. Non-sustained ventricular tachycardia (HCC)     1. STEMI involving left anterior descending coronary artery (HCC) Recent anterolateral STEMI secondary to total occlusion of LAD immediately proximal to previously placed mid LAD stent.  Successful overlapping stent placed with 0% residual stenosis post intervention.  Has  occasional twinge of chest pain on the left side but nothing resembling symptoms he had prior to recent intervention.  Continue aspirin 81 mg daily.  Continue Plavix 75 mg daily.  Continue sublingual nitroglycerin as needed.  Continue Coreg 3.125 mg p.o. twice daily.  Continue Farxiga 10 mg daily.  2. HFrEF (heart failure with reduced ejection fraction) (HCC) Recent echo 04/23/2020 with severely depressed LVEF of 25 to 30%.  G1 DD.  Recent follow-up echocardiogram demonstrated improvement in EF.  Echocardiogram On 09/11/2020 demonstrated EF of 40 to 45%.  + WMA's.  G1 DD. Spironolactone was recently increased to 25 mg by Dr. Shirlee Latch.  He continues on carvedilol 3.125 mg p.o. twice daily.  Farxiga 10 mg daily.  Digoxin 0.125 mg p.o. daily, losartan 25 mg daily, spironolactone 25 mg daily.  He has a follow-up with Dr. Shirlee Latch on August 31 at 10 AM  3. Mixed hyperlipidemia Continue atorvastatin 80 mg daily.  Follow-up lipid profile on 07/27/2020 total cholesterol 128, HDL 31, LDL 45, TG 221, VLDL 44.  4. Tobacco abuse Patient states he has not stopped smoking.  He states prior to MI he was smoking 3 packs/day.  He states he is down to 1/2 pack/day now.  Encouraged cessation.  5. Non-sustained ventricular tachycardia (HCC) Patient was having some nonsustained VT during hospital stay.  After starting carvedilol episodes improved.  Continue carvedilol 3.125 mg p.o. twice daily.  He he currently denies any palpitations, arrhythmias.  Medication Adjustments/Labs and Tests Ordered: Current medicines are reviewed at length with the patient today.  Concerns regarding medicines are outlined above.   Disposition: Follow-up with Dr. Diona Browner or APP 6 months  Signed, Rennis Harding, NP 10/18/2020 3:49 PM    [facility] at [facility] [address] [place], [place], [place] [address] Phone: [phone]; Fax: [phone]

## 2020-10-17 NOTE — Progress Notes (Signed)
Cardiac Individual Treatment Plan  Patient Details  Name: [name] MRN: [id number] Date of Birth: Aug 31, 1965 Referring Provider:   Flowsheet Row CARDIAC REHAB PHASE II ORIENTATION from 10/01/2020 in [facility] CARDIAC REHABILITATION  Referring Provider Dr. Tresa Endo       Initial Encounter Date:  Flowsheet Row CARDIAC REHAB PHASE II ORIENTATION from 10/01/2020 in [place] CARDIAC REHABILITATION  Date 10/01/20       Visit Diagnosis: ST elevation myocardial infarction involving left anterior descending (LAD) coronary artery [facility])  Status post coronary artery stent placement  Patient's Home Medications on Admission:  Current Outpatient Medications:    aspirin EC 81 MG tablet, Take 1 tablet (81 mg total) by mouth daily., Disp: 90 tablet, Rfl: 3   atorvastatin (LIPITOR) 80 MG tablet, Take 1 tablet (80 mg total) by mouth daily., Disp: 90 tablet, Rfl: 3   buPROPion (WELLBUTRIN SR) 150 MG 12 hr tablet, Take 1 tablet (150 mg total) by mouth 2 (two) times daily., Disp: 60 tablet, Rfl: 2   carvedilol (COREG) 3.125 MG tablet, Take 1 tablet (3.125 mg total) by mouth 2 (two) times daily with a meal., Disp: 180 tablet, Rfl: 3   clopidogrel (PLAVIX) 75 MG tablet, Take 1 tablet (75 mg total) by mouth daily., Disp: 90 tablet, Rfl: 3   dapagliflozin propanediol (FARXIGA) 10 MG TABS tablet, Take 1 tablet (10 mg total) by mouth daily., Disp: 90 tablet, Rfl: 3   digoxin (LANOXIN) 0.125 MG tablet, Take 1 tablet (0.125 mg total) by mouth daily., Disp: 90 tablet, Rfl: 3   losartan (COZAAR) 25 MG tablet, Take 1 tablet (25 mg total) by mouth daily., Disp: 90 tablet, Rfl: 3   nitroGLYCERIN (NITROSTAT) 0.4 MG SL tablet, Place 1 tablet (0.4 mg total) under the tongue every 5 (five) minutes as needed., Disp: 25 tablet, Rfl: 3   spironolactone (ALDACTONE) 25 MG tablet, Take 1 tablet (25 mg total) by mouth daily., Disp: 90 tablet, Rfl: 3  Past Medical History: Past Medical History:  Diagnosis Date   Coronary  arteriosclerosis    a. PCI LAD 2009 in [place];  b. 12/2010 Cath: LM: nl, LAD: patent stent mid, 25 mid, LCX nl, RCA: non dominant, nl, EF 50%   History of methamphetamine abuse (HCC)    a. Used x 6 yrs, quit July 2012   History of tobacco abuse    a. 41 yrs, up to 3 ppd, quit July 2012   Hypercholesterolemia     Tobacco Use: Social History   Tobacco Use  Smoking Status Light Smoker   Packs/day: 0.50   Years: 51.00   Pack years: 25.50   Types: Cigarettes  Smokeless Tobacco Never  Tobacco Comments   Currently 6-8 cigs per day    Labs: Recent Review Flowsheet Data     Labs for ITP Cardiac and Pulmonary Rehab Latest Ref Rng & Units 12/20/2010 04/22/2020 04/23/2020 04/24/2020 07/27/2020   Cholestrol 0 - 200 mg/dL 409 - 811 - 914   LDLCALC 0 - 99 mg/dL 64 - 89 - 45   HDL >78 mg/dL 29(F) - 62(Z) - 30(Q)   Trlycerides <150 mg/dL 657 - 79 - 846(N)   Hemoglobin A1c 4.8 - 5.6 % - - - 5.4 -   TCO2 22 - 32 mmol/L - 23 - - -       Capillary Blood Glucose: No results found for: GLUCAP   Exercise Target Goals: Exercise Program Goal: Individual exercise prescription set using results from initial 6 min walk  test and THRR while considering  patient's activity barriers and safety.   Exercise Prescription Goal: Starting with aerobic activity 30 plus minutes a day, 3 days per week for initial exercise prescription. Provide home exercise prescription and guidelines that participant acknowledges understanding prior to discharge.  Activity Barriers & Risk Stratification:  Activity Barriers & Cardiac Risk Stratification - 10/01/20 1255       Activity Barriers & Cardiac Risk Stratification   Activity Barriers Shortness of Breath;Decreased Ventricular Function    Cardiac Risk Stratification High             6 Minute Walk:  6 Minute Walk     Row Name 10/01/20 1421         6 Minute Walk   Phase Initial     Distance 1300 feet     Walk Time 6 minutes     # of Rest Breaks 0     MPH  2.46     METS 3.62     RPE 11     VO2 Peak 12.65     Symptoms Yes (comment)     Comments Right hip pain 4/10     Resting HR 74 bpm     Resting BP 90/60     Resting Oxygen Saturation  98 %     Exercise Oxygen Saturation  during 6 min walk 99 %     Max Ex. HR 80 bpm     Max Ex. BP 98/70     2 Minute Post BP 86/58              Oxygen Initial Assessment:   Oxygen Re-Evaluation:   Oxygen Discharge (Final Oxygen Re-Evaluation):   Initial Exercise Prescription:  Initial Exercise Prescription - 10/01/20 1400       Date of Initial Exercise RX and Referring Provider   Date 10/01/20    Referring Provider Dr. Tresa Endo    Expected Discharge Date 12/24/20      Treadmill   MPH 1.5    Grade 0    Minutes 17      NuStep   Level 1    SPM 80    Minutes 22      Prescription Details   Frequency (times per week) 3    Duration Progress to 30 minutes of continuous aerobic without signs/symptoms of physical distress      Intensity   THRR 40-80% of Max Heartrate 65-130    Ratings of Perceived Exertion 11-13    Perceived Dyspnea 0-4      Resistance Training   Training Prescription Yes    Weight 4 lbs    Reps 10-15             Perform Capillary Blood Glucose checks as needed.  Exercise Prescription Changes:   Exercise Prescription Changes     Row Name 10/15/20 1100             Response to Exercise   Blood Pressure (Admit) 104/66       Blood Pressure (Exercise) 98/70       Blood Pressure (Exit) 98/62       Heart Rate (Admit) 67 bpm       Heart Rate (Exercise) 107 bpm       Heart Rate (Exit) 76 bpm       Rating of Perceived Exertion (Exercise) 12       Duration Continue with 30 min of aerobic exercise without signs/symptoms of physical distress.  Intensity THRR unchanged               Progression   Progression Continue to progress workloads to maintain intensity without signs/symptoms of physical distress.               Resistance Training    Training Prescription Yes       Weight 4 lbs       Reps 10-15               Treadmill   MPH 2.4       Grade 4       Minutes 17       METs 4.16               NuStep   Level 2       SPM 119       Minutes 22       METs 3.52                Exercise Comments:   Exercise Goals and Review:   Exercise Goals     Row Name 10/01/20 1432 10/16/20 0833           Exercise Goals   Increase Physical Activity Yes Yes      Intervention Provide advice, education, support and counseling about physical activity/exercise needs.;Develop an individualized exercise prescription for aerobic and resistive training based on initial evaluation findings, risk stratification, comorbidities and participant's personal goals. Provide advice, education, support and counseling about physical activity/exercise needs.;Develop an individualized exercise prescription for aerobic and resistive training based on initial evaluation findings, risk stratification, comorbidities and participant's personal goals.      Expected Outcomes Short Term: Attend rehab on a regular basis to increase amount of physical activity.;Long Term: Add in home exercise to make exercise part of routine and to increase amount of physical activity.;Long Term: Exercising regularly at least 3-5 days a week. Short Term: Attend rehab on a regular basis to increase amount of physical activity.;Long Term: Add in home exercise to make exercise part of routine and to increase amount of physical activity.;Long Term: Exercising regularly at least 3-5 days a week.      Increase Strength and Stamina Yes Yes      Intervention Provide advice, education, support and counseling about physical activity/exercise needs.;Develop an individualized exercise prescription for aerobic and resistive training based on initial evaluation findings, risk stratification, comorbidities and participant's personal goals. Provide advice, education, support and counseling about  physical activity/exercise needs.;Develop an individualized exercise prescription for aerobic and resistive training based on initial evaluation findings, risk stratification, comorbidities and participant's personal goals.      Expected Outcomes Short Term: Increase workloads from initial exercise prescription for resistance, speed, and METs.;Short Term: Perform resistance training exercises routinely during rehab and add in resistance training at home;Long Term: Improve cardiorespiratory fitness, muscular endurance and strength as measured by increased METs and functional capacity ( ) Short Term: Increase workloads from initial exercise prescription for resistance, speed, and METs.;Short Term: Perform resistance training exercises routinely during rehab and add in resistance training at home;Long Term: Improve cardiorespiratory fitness, muscular endurance and strength as measured by increased METs and functional capacity ( )      Able to understand and use rate of perceived exertion (RPE) scale Yes Yes      Intervention Provide education and explanation on how to use RPE scale Provide education and explanation on how to use RPE scale      Expected Outcomes Short  Term: Able to use RPE daily in rehab to express subjective intensity level;Long Term:  Able to use RPE to guide intensity level when exercising independently Short Term: Able to use RPE daily in rehab to express subjective intensity level;Long Term:  Able to use RPE to guide intensity level when exercising independently      Knowledge and understanding of Target Heart Rate Range (THRR) Yes Yes      Intervention Provide education and explanation of THRR including how the numbers were predicted and where they are located for reference Provide education and explanation of THRR including how the numbers were predicted and where they are located for reference      Expected Outcomes Short Term: Able to state/look up THRR;Long Term: Able to use THRR  to govern intensity when exercising independently;Short Term: Able to use daily as guideline for intensity in rehab Short Term: Able to state/look up THRR;Long Term: Able to use THRR to govern intensity when exercising independently;Short Term: Able to use daily as guideline for intensity in rehab      Able to check pulse independently Yes Yes      Intervention Provide education and demonstration on how to check pulse in carotid and radial arteries.;Review the importance of being able to check your own pulse for safety during independent exercise Provide education and demonstration on how to check pulse in carotid and radial arteries.;Review the importance of being able to check your own pulse for safety during independent exercise      Expected Outcomes Short Term: Able to explain why pulse checking is important during independent exercise;Long Term: Able to check pulse independently and accurately Short Term: Able to explain why pulse checking is important during independent exercise;Long Term: Able to check pulse independently and accurately      Understanding of Exercise Prescription Yes Yes      Intervention Provide education, explanation, and written materials on patient's individual exercise prescription Provide education, explanation, and written materials on patient's individual exercise prescription      Expected Outcomes Short Term: Able to explain program exercise prescription;Long Term: Able to explain home exercise prescription to exercise independently Short Term: Able to explain program exercise prescription;Long Term: Able to explain home exercise prescription to exercise independently               Exercise Goals Re-Evaluation :  Exercise Goals Re-Evaluation     Row Name 10/16/20 0833             Exercise Goal Re-Evaluation   Exercise Goals Review Increase Physical Activity;Increase Strength and Stamina;Able to understand and use rate of perceived exertion (RPE)  scale;Knowledge and understanding of Target Heart Rate Range (THRR);Able to check pulse independently;Understanding of Exercise Prescription       Comments Pt has attended 7 sessions of cardiac rehab. He has been progressing well and has been increasing his workloads. During one of his first sessions, we took him to the ED due to chest tightness, lightheadedness, SOB, and ventricular trigeminy. He left AMA. He has reported no problems since that day. He states that he thinks that he was pushing himself too hard on that day. He is currently exercising at 3.52 METs on the stepper. Will continue to monitor and progress as able.       Expected Outcomes Through exercise at rehab and at home, the patient will meet their stated goals.                 Discharge Exercise Prescription (  Final Exercise Prescription Changes):  Exercise Prescription Changes - 10/15/20 1100       Response to Exercise   Blood Pressure (Admit) 104/66    Blood Pressure (Exercise) 98/70    Blood Pressure (Exit) 98/62    Heart Rate (Admit) 67 bpm    Heart Rate (Exercise) 107 bpm    Heart Rate (Exit) 76 bpm    Rating of Perceived Exertion (Exercise) 12    Duration Continue with 30 min of aerobic exercise without signs/symptoms of physical distress.    Intensity THRR unchanged      Progression   Progression Continue to progress workloads to maintain intensity without signs/symptoms of physical distress.      Resistance Training   Training Prescription Yes    Weight 4 lbs    Reps 10-15      Treadmill   MPH 2.4    Grade 4    Minutes 17    METs 4.16      NuStep   Level 2    SPM 119    Minutes 22    METs 3.52             Nutrition:  Target Goals: Understanding of nutrition guidelines, daily intake of sodium 1500mg , cholesterol 200mg , calories 30% from fat and 7% or less from saturated fats, daily to have 5 or more servings of fruits and vegetables.  Biometrics:  Pre Biometrics - 10/01/20 1432        Pre Biometrics   Height 5\' 11"  (1.803 m)    Weight 72.9 kg    Waist Circumference 33 inches    Hip Circumference 38.5 inches    Waist to Hip Ratio 0.86 %    BMI (Calculated) 22.43    Triceps Skinfold 11 mm    % Body Fat 20.5 %    Grip Strength 42.9 kg    Flexibility 0 in    Single Leg Stand 53 seconds              Nutrition Therapy Plan and Nutrition Goals:  Nutrition Therapy & Goals - 10/08/20 0955       Personal Nutrition Goals   Comments Patient scored 30 on his diet assessment. We offer 2 eduacational sessions on heart healthy nutrition and assistance with RD referral if interested.      Intervention Plan   Intervention Nutrition handout(s) given to patient.             Nutrition Assessments:  Nutrition Assessments - 10/01/20 1258       MEDFICTS Scores   Pre Score 30            MEDIFICTS Score Key: ?70 Need to make dietary changes  40-70 Heart Healthy Diet ? 40 Therapeutic Level Cholesterol Diet   Picture Your Plate Scores: 12/01/20 Unhealthy dietary pattern with much room for improvement. 41-50 Dietary pattern unlikely to meet recommendations for good health and room for improvement. 51-60 More healthful dietary pattern, with some room for improvement.  >60 Healthy dietary pattern, although there may be some specific behaviors that could be improved.    Nutrition Goals Re-Evaluation:   Nutrition Goals Discharge (Final Nutrition Goals Re-Evaluation):   Psychosocial: Target Goals: Acknowledge presence or absence of significant depression and/or stress, maximize coping skills, provide positive support system. Participant is able to verbalize types and ability to use techniques and skills needed for reducing stress and depression.  Initial Review & Psychosocial Screening:  Initial Psych Review & Screening - 10/01/20 1256  Initial Review   Current issues with Current Stress Concerns    Source of Stress Concerns Financial    Comments  Currently on short term disability but it will run out this month. This provides him with just enough money to pay his mortgage.      Family Dynamics   Good Support System? Yes    Comments His wife is his support system. He also has 5 grandchildren.      Barriers   Psychosocial barriers to participate in program The patient should benefit from training in stress management and relaxation.      Screening Interventions   Interventions Encouraged to exercise    Expected Outcomes Short Term goal: Utilizing psychosocial counselor, staff and physician to assist with identification of specific Stressors or current issues interfering with healing process. Setting desired goal for each stressor or current issue identified.;Long Term Goal: Stressors or current issues are controlled or eliminated.;Long Term goal: The participant improves quality of Life and PHQ9 Scores as seen by post scores and/or verbalization of changes             Quality of Life Scores:  Quality of Life - 10/01/20 1445       Quality of Life   Select Quality of Life      Quality of Life Scores   Health/Function Pre 9.75 %    Socioeconomic Pre 15.64 %    Psych/Spiritual Pre 24.29 %    Family Pre 9.75 %    GLOBAL Pre 14.52 %            Scores of 19 and below usually indicate a poorer quality of life in these areas.  A difference of  2-3 points is a clinically meaningful difference.  A difference of 2-3 points in the total score of the Quality of Life Index has been associated with significant improvement in overall quality of life, self-image, physical symptoms, and general health in studies assessing change in quality of life.  PHQ-9: Recent Review Flowsheet Data     Depression screen [facility] 2/9 10/01/2020   Decreased Interest 0   Down, Depressed, Hopeless 0   PHQ - 2 Score 0   Altered sleeping 1   Tired, decreased energy 1   Change in appetite 0   Feeling bad or failure about yourself  0   Trouble concentrating  0   Moving slowly or fidgety/restless 0   Suicidal thoughts 0   PHQ-9 Score 2   Difficult doing work/chores Not difficult at all      Interpretation of Total Score  Total Score Depression Severity:  1-4 = Minimal depression, 5-9 = Mild depression, 10-14 = Moderate depression, 15-19 = Moderately severe depression, 20-27 = Severe depression   Psychosocial Evaluation and Intervention:  Psychosocial Evaluation - 10/01/20 1436       Psychosocial Evaluation & Interventions   Interventions Stress management education;Relaxation education;Encouraged to exercise with the program and follow exercise prescription    Comments Pt has barriers that could effect his rehab which include finances and transportation. He was previously unable to do rehab do to his coinsurance. He would have been unable to pay this due to finances. He is currently on short term disability through [facility]. He reports that he has just enough money to pay his mortgage, but he is stressed because the disability runs out this month. Nicolette [employer] will not let him return to work until his life vest is removed. He has an appointment with Dr. Shirlee Latch  on 8/31, and he is hopeful that the life vest will be removed then. He was interested in vocational rehab. I called the [facility] office, and they filled out a referral form for him. They stated that they would be calling him today. He is currently smoking about 1/2 pack of cigarettes per day. He has been on bupropion for about two months and reports that it is not helping. I have given the patient a quit smoking guide by the [employer]. He reports that he is interested in stopping. He scored a 2 on his PHQ-9. He reports that he has a good support system from his wife. His goals while in the program are to decrease his SOB with activity. He is eager to start the program.    Expected Outcomes The patient will successfully stop smoking and stressors  will be lessened or eliminated    Continue Psychosocial Services  No Follow up required             Psychosocial Re-Evaluation:  Psychosocial Re-Evaluation     Row Name 10/08/20 [phone]             Psychosocial Re-Evaluation   Current issues with Current Stress Concerns       Comments Patient is new to the program. He has completed 2 sessions. He continues to have barriers of finances and transportation. He is using Wellbutrin for smoking cessation but continues to smoke but has cut back. He feels he is able to manage his current stress concerns. He does demonstrate an interest to improve his health to enable him to return to work to help improve his financial situation. We will continue to monitor his progress.       Expected Outcomes Patient's stress concerns will be managed.               Initial Review   Source of Stress Concerns Financial;Transportation       Comments Currently on short term disability but it will run out this month. This provides him with just enough money to pay his mortgage.                Psychosocial Discharge (Final Psychosocial Re-Evaluation):  Psychosocial Re-Evaluation - 10/08/20 0956       Psychosocial Re-Evaluation   Current issues with Current Stress Concerns    Comments Patient is new to the program. He has completed 2 sessions. He continues to have barriers of finances and transportation. He is using Wellbutrin for smoking cessation but continues to smoke but has cut back. He feels he is able to manage his current stress concerns. He does demonstrate an interest to improve his health to enable him to return to work to help improve his financial situation. We will continue to monitor his progress.    Expected Outcomes Patient's stress concerns will be managed.      Initial Review   Source of Stress Concerns Financial;Transportation    Comments Currently on short term disability but it will run out this month. This provides him with just  enough money to pay his mortgage.             Vocational Rehabilitation: Provide vocational rehab assistance to qualifying candidates.   Vocational Rehab Evaluation & Intervention:  Vocational Rehab - 10/01/20 1446       Initial Vocational Rehab Evaluation & Intervention   Assessment shows need for Vocational Rehabilitation Yes    Documents faxed to [place] [facility]ational [facility] 10/01/20   referral form filled out via phone            Education: Education Goals: Education classes will be provided on a weekly basis, covering required topics. Participant will state understanding/return demonstration of topics presented.  Learning Barriers/Preferences:  Learning Barriers/Preferences - 10/01/20 1300       Learning Barriers/Preferences   Learning Barriers Reading    Learning Preferences Individual Instruction;Skilled Demonstration             Education Topics: Hypertension, Hypertension Reduction -Define heart disease and high blood pressure. Discus how high blood pressure affects the body and ways to reduce high blood pressure.   Exercise and Your Heart -Discuss why it is important to exercise, the FITT principles of exercise, normal and abnormal responses to exercise, and how to exercise safely.   Angina -Discuss definition of angina, causes of angina, treatment of angina, and how to decrease risk of having angina.   Cardiac Medications -Review what the following cardiac medications are used for, how they affect the body, and side effects that may occur when taking the medications.  Medications include Aspirin, Beta blockers, calcium channel blockers, ACE Inhibitors, angiotensin receptor blockers, diuretics, digoxin, and antihyperlipidemics.   Congestive Heart Failure -Discuss the definition of CHF, how to live with CHF, the signs and symptoms of CHF, and how keep track of weight and sodium intake. Flowsheet Row CARDIAC REHAB PHASE II EXERCISE from  10/03/2020 in [place] CARDIAC REHABILITATION  Date 10/03/20  Educator DF  Instruction Review Code 2- Demonstrated Understanding       Heart Disease and Intimacy -Discus the effect sexual activity has on the heart, how changes occur during intimacy as we age, and safety during sexual activity.   Smoking Cessation / COPD -Discuss different methods to quit smoking, the health benefits of quitting smoking, and the definition of COPD.   Nutrition I: Fats -Discuss the types of cholesterol, what cholesterol does to the heart, and how cholesterol levels can be controlled.   Nutrition II: Labels -Discuss the different components of food labels and how to read food label   Heart Parts/Heart Disease and PAD -Discuss the anatomy of the heart, the pathway of blood circulation through the heart, and these are affected by heart disease.   Stress I: Signs and Symptoms -Discuss the causes of stress, how stress may lead to anxiety and depression, and ways to limit stress.   Stress II: Relaxation -Discuss different types of relaxation techniques to limit stress.   Warning Signs of Stroke / TIA -Discuss definition of a stroke, what the signs and symptoms are of a stroke, and how to identify when someone is having stroke.   Knowledge Questionnaire Score:  Knowledge Questionnaire Score - 10/01/20 1301       Knowledge Questionnaire Score   Pre Score 24/28             Core Components/Risk Factors/Patient Goals at Admission:  Personal Goals and Risk Factors at Admission - 10/01/20 1305       Core Components/Risk Factors/Patient Goals on Admission    Weight Management Yes;Weight Maintenance    Intervention Weight Management: Develop a combined nutrition and exercise program designed to reach desired caloric intake, while maintaining appropriate intake of nutrient and fiber, sodium and fats, and appropriate energy expenditure required for the weight goal.;Weight Management: Provide  education and appropriate resources to help participant work on and attain dietary goals.    Expected Outcomes Short Term: Continue to  assess and modify interventions until short term weight is achieved;Long Term: Adherence to nutrition and physical activity/exercise program aimed toward attainment of established weight goal;Weight Maintenance: Understanding of the daily nutrition guidelines, which includes 25-35% calories from fat, 7% or less cal from saturated fats, less than 200mg  cholesterol, less than 1.5gm of sodium, & 5 or more servings of fruits and vegetables daily    Tobacco Cessation Yes    Number of packs per day 1/2    Intervention Assist the participant in steps to quit. Provide individualized education and counseling about committing to Tobacco Cessation, relapse prevention, and pharmacological support that can be provided by physician.; , assist with locating and accessing local/national Quit Smoking programs, and support quit date choice.    Expected Outcomes Short Term: Will demonstrate readiness to quit, by selecting a quit date.;Short Term: Will quit all tobacco product use, adhering to prevention of relapse plan.;Long Term: Complete abstinence from all tobacco products for at least 12 months from quit date.    Improve shortness of breath with ADL's Yes    Intervention Provide education, individualized exercise plan and daily activity instruction to help decrease symptoms of SOB with activities of daily living.    Expected Outcomes Short Term: Improve cardiorespiratory fitness to achieve a reduction of symptoms when performing ADLs;Long Term: Be able to perform more ADLs without symptoms or delay the onset of symptoms    Stress Yes    Intervention Offer individual and/or small group education and counseling on adjustment to heart disease, stress management and health-related lifestyle change. Teach and support self-help strategies.;Refer participants  experiencing significant psychosocial distress to appropriate mental health specialists for further evaluation and treatment. When possible, include family members and significant others in education/counseling sessions.    Expected Outcomes Short Term: Participant demonstrates changes in health-related behavior, relaxation and other stress management skills, ability to obtain effective social support, and compliance with psychotropic medications if prescribed.;Long Term: Emotional wellbeing is indicated by absence of clinically significant psychosocial distress or social isolation.             Core Components/Risk Factors/Patient Goals Review:   Goals and Risk Factor Review     Row Name 10/08/20 1001             Core Components/Risk Factors/Patient Goals Review   Personal Goals Review Tobacco Cessation;Weight Management/Obesity;Stress       Review Patient was referrred to CR with STEMI and DES. He has completed 2 sessions maintaining his weight since his initial visit. Patient was put on a life vest since 2/22 due to EF 30-35%. He continues to wear with hopes that his EF will return to normal. Last ECHO was improved with 40-45% EF 7/12. He is using Wellburtin for smoking cessation and is currently smoking 1/2 pack/day. His personal goals for the program are to improve his SOB with activity; quit smoking; get life vest off so he can return to work; get stronger. We will continue to  monitor his progress as he works towards meetings these goals.       Expected Outcomes Patient will complete the program meeting both personal and program goals.                Core Components/Risk Factors/Patient Goals at Discharge (Final Review):   Goals and Risk Factor Review - 10/08/20 1001       Core Components/Risk Factors/Patient Goals Review   Personal Goals Review Tobacco Cessation;Weight Management/Obesity;Stress    Review Patient was referrred to  CR with STEMI and DES. He has completed 2  sessions maintaining his weight since his initial visit. Patient was put on a life vest since 2/22 due to EF 30-35%. He continues to wear with hopes that his EF will return to normal. Last ECHO was improved with 40-45% EF 7/12. He is using Wellburtin for smoking cessation and is currently smoking 1/2 pack/day. His personal goals for the program are to improve his SOB with activity; quit smoking; get life vest off so he can return to work; get stronger. We will continue to  monitor his progress as he works towards meetings these goals.    Expected Outcomes Patient will complete the program meeting both personal and program goals.             ITP Comments:   Comments: ITP REVIEW Pt is making expected progress toward Cardiac Rehab goals after completing 7 sessions. Recommend continued exercise, life style modification, education, and increased stamina and strength.

## 2020-10-18 ENCOUNTER — Encounter: Payer: Self-pay | Admitting: Family Medicine

## 2020-10-18 ENCOUNTER — Ambulatory Visit (INDEPENDENT_AMBULATORY_CARE_PROVIDER_SITE_OTHER): Payer: BC Managed Care – PPO | Admitting: Family Medicine

## 2020-10-18 VITALS — BP 118/68 | HR 68 | Ht 71.0 in | Wt 161.5 lb

## 2020-10-18 DIAGNOSIS — Z720 Tobacco use: Secondary | ICD-10-CM

## 2020-10-18 DIAGNOSIS — E782 Mixed hyperlipidemia: Secondary | ICD-10-CM

## 2020-10-18 DIAGNOSIS — I2510 Atherosclerotic heart disease of native coronary artery without angina pectoris: Secondary | ICD-10-CM | POA: Diagnosis not present

## 2020-10-18 DIAGNOSIS — I5020 Unspecified systolic (congestive) heart failure: Secondary | ICD-10-CM | POA: Diagnosis not present

## 2020-10-18 DIAGNOSIS — I4729 Other ventricular tachycardia: Secondary | ICD-10-CM

## 2020-10-18 DIAGNOSIS — I472 Ventricular tachycardia: Secondary | ICD-10-CM

## 2020-10-18 NOTE — Patient Instructions (Addendum)

## 2020-10-19 ENCOUNTER — Other Ambulatory Visit: Payer: Self-pay

## 2020-10-19 ENCOUNTER — Encounter (HOSPITAL_COMMUNITY)
Admission: RE | Admit: 2020-10-19 | Discharge: 2020-10-19 | Disposition: A | Discharge status: Home / Self Care | Payer: BC Managed Care – PPO | Source: Ambulatory Visit | Attending: Cardiovascular Disease | Admitting: Cardiovascular Disease

## 2020-10-19 DIAGNOSIS — Z955 Presence of coronary angioplasty implant and graft: Secondary | ICD-10-CM

## 2020-10-19 DIAGNOSIS — I2102 ST elevation (STEMI) myocardial infarction involving left anterior descending coronary artery: Secondary | ICD-10-CM | POA: Diagnosis not present

## 2020-10-19 NOTE — Progress Notes (Signed)
Daily Session Note  Patient Details  Name: [name] MRN: [id number] Date of Birth: 11/11/1965 Referring Provider:   Flowsheet Row CARDIAC REHAB PHASE II ORIENTATION from 10/01/2020 in [facility]  Referring Provider Dr. Kelly       Encounter Date: 10/19/2020  Check In:  Session Check In - 10/19/20 1100       Check-In   Supervising physician immediately available to respond to emergencies [facility] MD immediately available    Physician(s) Dr. Nishan    Location [facility]    Staff Present Dalton Fletcher, MS, ACSM-CEP, Exercise Physiologist;Debra Johnson, RN, BSN    Virtual Visit No    Medication changes reported     No    Fall or balance concerns reported    No    Tobacco Cessation No Change    Warm-up and Cool-down Performed as group-led instruction    Resistance Training Performed Yes    VAD Patient? No    PAD/SET Patient? No      Pain Assessment   Currently in Pain? No/denies    Pain Score 0-No pain    Multiple Pain Sites No             Capillary Blood Glucose: No results found for this or any previous visit (from the past 24 hour(s)).    Social History   Tobacco Use  Smoking Status Light Smoker   Packs/day: 0.50   Years: 51.00   Pack years: 25.50   Types: Cigarettes  Smokeless Tobacco Never  Tobacco Comments   Currently 6-8 cigs per day    Goals Met:  Independence with exercise equipment Exercise tolerated well No report of cardiac concerns or symptoms Strength training completed today  Goals Unmet:  Not Applicable  Comments: checkout time is 1200   Dr. Jehanzeb Memon is Medical Director for [facility]. 

## 2020-10-22 ENCOUNTER — Encounter (HOSPITAL_COMMUNITY)
Admission: RE | Admit: 2020-10-22 | Discharge: 2020-10-22 | Disposition: A | Discharge status: Home / Self Care | Payer: BC Managed Care – PPO | Source: Ambulatory Visit | Attending: Cardiovascular Disease | Admitting: Cardiovascular Disease

## 2020-10-22 ENCOUNTER — Other Ambulatory Visit: Payer: Self-pay

## 2020-10-22 DIAGNOSIS — I2102 ST elevation (STEMI) myocardial infarction involving left anterior descending coronary artery: Secondary | ICD-10-CM | POA: Diagnosis not present

## 2020-10-22 DIAGNOSIS — Z955 Presence of coronary angioplasty implant and graft: Secondary | ICD-10-CM

## 2020-10-22 NOTE — Progress Notes (Signed)
Daily Session Note  Patient Details  Name: [name] MRN: [id number] Date of Birth: 01-17-1966 Referring Provider:   Flowsheet Row CARDIAC REHAB PHASE II ORIENTATION from 10/01/2020 in [place]  Referring Provider Dr. Claiborne Billings       Encounter Date: 10/22/2020  Check In:  Session Check In - 10/22/20 1100       Check-In   Supervising physician immediately available to respond to emergencies [facility] MD immediately available    Physician(s) Dr. Johnsie Cancel    Location [facility]    Staff Present Hoy Register, MS, ACSM-CEP, Exercise Physiologist;Other    Virtual Visit No    Medication changes reported     No    Fall or balance concerns reported    No    Tobacco Cessation No Change    Warm-up and Cool-down Performed as group-led instruction    Resistance Training Performed Yes    VAD Patient? No    PAD/SET Patient? No      Pain Assessment   Currently in Pain? No/denies    Pain Score 0-No pain    Multiple Pain Sites No             Capillary Blood Glucose: No results found for this or any previous visit (from the past 24 hour(s)).    Social History   Tobacco Use  Smoking Status Light Smoker   Packs/day: 0.50   Years: 51.00   Pack years: 25.50   Types: Cigarettes  Smokeless Tobacco Never  Tobacco Comments   Currently 6-8 cigs per day    Goals Met:  Independence with exercise equipment Exercise tolerated well No report of cardiac concerns or symptoms Strength training completed today  Goals Unmet:  Not Applicable  Comments: checkout time is 1200   Dr. Kathie Dike is Medical Director for [facility].

## 2020-10-24 ENCOUNTER — Encounter (HOSPITAL_COMMUNITY): Payer: BC Managed Care – PPO

## 2020-10-24 NOTE — Progress Notes (Signed)
Spoke with pt and notified of results per Dr. Sherene Sires. Pt verbalized understanding and denied any questions. Referral already placed

## 2020-10-26 ENCOUNTER — Encounter (HOSPITAL_COMMUNITY)
Admission: RE | Admit: 2020-10-26 | Discharge: 2020-10-26 | Disposition: A | Discharge status: Home / Self Care | Payer: BC Managed Care – PPO | Source: Ambulatory Visit | Attending: Cardiovascular Disease | Admitting: Cardiovascular Disease

## 2020-10-26 ENCOUNTER — Other Ambulatory Visit: Payer: Self-pay

## 2020-10-26 DIAGNOSIS — I2102 ST elevation (STEMI) myocardial infarction involving left anterior descending coronary artery: Secondary | ICD-10-CM | POA: Diagnosis not present

## 2020-10-26 DIAGNOSIS — Z955 Presence of coronary angioplasty implant and graft: Secondary | ICD-10-CM

## 2020-10-26 NOTE — Progress Notes (Signed)
Daily Session Note  Patient Details  Name: [name] MRN: [id number] Date of Birth: 29-Jan-1966 Referring Provider:   Flowsheet Row CARDIAC REHAB PHASE II ORIENTATION from 10/01/2020 in [place]  Referring Provider Dr. Claiborne Billings       Encounter Date: 10/26/2020  Check In:  Session Check In - 10/26/20 1100       Check-In   Supervising physician immediately available to respond to emergencies [facility] MD immediately available    Physician(s) Dr. Harl Bowie    Location [facility]    Staff Present Aundra Dubin, RN, BSN;Other    Virtual Visit No    Medication changes reported     No    Fall or balance concerns reported    No    Tobacco Cessation No Change    Warm-up and Cool-down Performed as group-led instruction    Resistance Training Performed Yes    VAD Patient? No    PAD/SET Patient? No      Pain Assessment   Currently in Pain? No/denies    Pain Score 0-No pain    Multiple Pain Sites No             Capillary Blood Glucose: No results found for this or any previous visit (from the past 24 hour(s)).    Social History   Tobacco Use  Smoking Status Light Smoker   Packs/day: 0.50   Years: 51.00   Pack years: 25.50   Types: Cigarettes  Smokeless Tobacco Never  Tobacco Comments   Currently 6-8 cigs per day    Goals Met:  Independence with exercise equipment Exercise tolerated well No report of concerns or symptoms today Strength training completed today  Goals Unmet:  Not Applicable  Comments: Check out 1200.   Dr. Kathie Dike is Medical Director for [facility].

## 2020-10-28 ENCOUNTER — Other Ambulatory Visit (HOSPITAL_COMMUNITY): Payer: Self-pay | Admitting: Cardiology

## 2020-10-29 ENCOUNTER — Encounter (HOSPITAL_COMMUNITY)
Admission: RE | Admit: 2020-10-29 | Discharge: 2020-10-29 | Disposition: A | Discharge status: Home / Self Care | Payer: BC Managed Care – PPO | Source: Ambulatory Visit | Attending: Cardiovascular Disease | Admitting: Cardiovascular Disease

## 2020-10-29 ENCOUNTER — Other Ambulatory Visit: Payer: Self-pay

## 2020-10-29 VITALS — Wt 163.1 lb

## 2020-10-29 DIAGNOSIS — I2102 ST elevation (STEMI) myocardial infarction involving left anterior descending coronary artery: Secondary | ICD-10-CM

## 2020-10-29 DIAGNOSIS — Z955 Presence of coronary angioplasty implant and graft: Secondary | ICD-10-CM

## 2020-10-29 NOTE — Progress Notes (Signed)
Daily Session Note  Patient Details  Name: [name] MRN: [id number] Date of Birth: [birth year] Referring Provider:   Flowsheet Row CARDIAC REHAB PHASE II ORIENTATION from 10/01/2020 in [place]  Referring Provider Dr. Claiborne Billings       Encounter Date: 10/29/2020  Check In:  Session Check In - 10/29/20 0815       Check-In   Supervising physician immediately available to respond to emergencies [facility] MD immediately available    Physician(s) Dr. Domenic Polite    Location [facility]    Staff Present Geanie Cooley, RN;Debra Wynetta Emery, RN, BSN    Virtual Visit No    Medication changes reported     No    Fall or balance concerns reported    No    Tobacco Cessation No Change    Warm-up and Cool-down Performed as group-led instruction    Resistance Training Performed Yes    VAD Patient? No    PAD/SET Patient? No      Pain Assessment   Currently in Pain? No/denies    Pain Score 0-No pain    Multiple Pain Sites No             Capillary Blood Glucose: No results found for this or any previous visit (from the past 24 hour(s)).    Social History   Tobacco Use  Smoking Status Light Smoker   Packs/day: 0.50   Years: 51.00   Pack years: 25.50   Types: Cigarettes  Smokeless Tobacco Never  Tobacco Comments   Currently 6-8 cigs per day    Goals Met:  Independence with exercise equipment Exercise tolerated well No report of concerns or symptoms today Strength training completed today  Goals Unmet:  Not Applicable  Comments: check out @ 9:15am   Dr. Kathie Dike is Medical Director for [facility].

## 2020-10-31 ENCOUNTER — Telehealth (INDEPENDENT_AMBULATORY_CARE_PROVIDER_SITE_OTHER): Payer: BC Managed Care – PPO | Admitting: Acute Care

## 2020-10-31 ENCOUNTER — Ambulatory Visit (HOSPITAL_COMMUNITY)
Admission: RE | Admit: 2020-10-31 | Discharge: 2020-10-31 | Disposition: A | Discharge status: Home / Self Care | Payer: BC Managed Care – PPO | Source: Ambulatory Visit

## 2020-10-31 ENCOUNTER — Encounter: Payer: Self-pay | Admitting: Acute Care

## 2020-10-31 ENCOUNTER — Encounter (HOSPITAL_COMMUNITY): Payer: Self-pay | Admitting: Cardiology

## 2020-10-31 ENCOUNTER — Ambulatory Visit (HOSPITAL_COMMUNITY)
Admission: RE | Admit: 2020-10-31 | Discharge: 2020-10-31 | Disposition: A | Discharge status: Home / Self Care | Payer: BC Managed Care – PPO | Source: Ambulatory Visit | Attending: Cardiology | Admitting: Cardiology

## 2020-10-31 ENCOUNTER — Other Ambulatory Visit: Payer: Self-pay

## 2020-10-31 ENCOUNTER — Encounter (HOSPITAL_COMMUNITY): Payer: BC Managed Care – PPO

## 2020-10-31 ENCOUNTER — Other Ambulatory Visit (HOSPITAL_COMMUNITY): Payer: Self-pay

## 2020-10-31 VITALS — BP 130/88 | HR 75 | Wt 166.4 lb

## 2020-10-31 DIAGNOSIS — Z716 Tobacco abuse counseling: Secondary | ICD-10-CM | POA: Insufficient documentation

## 2020-10-31 DIAGNOSIS — I252 Old myocardial infarction: Secondary | ICD-10-CM | POA: Insufficient documentation

## 2020-10-31 DIAGNOSIS — Z5941 Food insecurity: Secondary | ICD-10-CM | POA: Insufficient documentation

## 2020-10-31 DIAGNOSIS — Z7984 Long term (current) use of oral hypoglycemic drugs: Secondary | ICD-10-CM | POA: Diagnosis not present

## 2020-10-31 DIAGNOSIS — Z8249 Family history of ischemic heart disease and other diseases of the circulatory system: Secondary | ICD-10-CM | POA: Diagnosis not present

## 2020-10-31 DIAGNOSIS — R942 Abnormal results of pulmonary function studies: Secondary | ICD-10-CM | POA: Diagnosis not present

## 2020-10-31 DIAGNOSIS — F17210 Nicotine dependence, cigarettes, uncomplicated: Secondary | ICD-10-CM | POA: Diagnosis not present

## 2020-10-31 DIAGNOSIS — I5022 Chronic systolic (congestive) heart failure: Secondary | ICD-10-CM | POA: Diagnosis not present

## 2020-10-31 DIAGNOSIS — Z79899 Other long term (current) drug therapy: Secondary | ICD-10-CM | POA: Diagnosis not present

## 2020-10-31 DIAGNOSIS — Z7982 Long term (current) use of aspirin: Secondary | ICD-10-CM | POA: Insufficient documentation

## 2020-10-31 DIAGNOSIS — Z596 Low income: Secondary | ICD-10-CM | POA: Diagnosis not present

## 2020-10-31 DIAGNOSIS — Z955 Presence of coronary angioplasty implant and graft: Secondary | ICD-10-CM | POA: Insufficient documentation

## 2020-10-31 DIAGNOSIS — Z87891 Personal history of nicotine dependence: Secondary | ICD-10-CM

## 2020-10-31 DIAGNOSIS — Z7902 Long term (current) use of antithrombotics/antiplatelets: Secondary | ICD-10-CM | POA: Insufficient documentation

## 2020-10-31 DIAGNOSIS — I2510 Atherosclerotic heart disease of native coronary artery without angina pectoris: Secondary | ICD-10-CM | POA: Diagnosis present

## 2020-10-31 DIAGNOSIS — I255 Ischemic cardiomyopathy: Secondary | ICD-10-CM | POA: Insufficient documentation

## 2020-10-31 LAB — BASIC METABOLIC PANEL
Anion gap: 8 (ref 5–15)
BUN: 9 mg/dL (ref 6–20)
CO2: 22 mmol/L (ref 22–32)
Calcium: 9.6 mg/dL (ref 8.9–10.3)
Chloride: 107 mmol/L (ref 98–111)
Creatinine, Ser: 0.86 mg/dL (ref 0.61–1.24)
GFR, Estimated: >60 mL/min (ref >60)
Glucose, Bld: 95 mg/dL (ref 70–99)
Potassium: 4.4 mmol/L (ref 3.5–5.1)
Sodium: 137 mmol/L (ref 135–145)

## 2020-10-31 NOTE — Patient Instructions (Signed)
Thank you for participating in the [facility] Screening [facility]. It was our pleasure to meet you today. We will call you with the results of your scan within the next few days. Your scan will be assigned a Lung RADS category score by the physicians reading the scans.  This Lung RADS score determines follow up scanning.  See below for description of categories, and follow up screening recommendations. We will be in touch to schedule your follow up screening annually or based on recommendations of our providers. We will fax a copy of your scan results to your Primary Care Physician, or the physician who referred you to the program, to ensure they have the results. Please call the office if you have any questions or concerns regarding your scanning experience or results.  Our office number is [phone]. Please speak with Denise Phelps, RN. She is our Lung Cancer Screening RN. If she is unavailable when you call, please have the office staff send her a message. She will return your call at her earliest convenience. Remember, if your scan is normal, we will scan you annually as long as you continue to meet the criteria for the program. (Age 55-77, Current smoker or smoker who has quit within the last 15 years). If you are a smoker, remember, quitting is the single most powerful action that you can take to decrease your risk of lung cancer and other pulmonary, breathing related problems. We know quitting is hard, and we are here to help.  Please let us know if there is anything we can do to help you meet your goal of quitting. If you are a former smoker, congratulations. We are proud of you! Remain smoke free! Remember you can refer friends or family members through the number above.  We will screen them to make sure they meet criteria for the program. Thank you for helping us take better care of you by participating in Lung Screening.  Lung RADS Categories:  Lung RADS 1: no nodules  or definitely non-concerning nodules.  Recommendation is for a repeat annual scan in 12 months.  Lung RADS 2:  nodules that are non-concerning in appearance and behavior with a very low likelihood of becoming an active cancer. Recommendation is for a repeat annual scan in 12 months.  Lung RADS 3: nodules that are probably non-concerning , includes nodules with a low likelihood of becoming an active cancer.  Recommendation is for a 6-month repeat screening scan. Often noted after an upper respiratory illness. We will be in touch to make sure you have no questions, and to schedule your 6-month scan.  Lung RADS 4 A: nodules with concerning findings, recommendation is most often for a follow up scan in 3 months or additional testing based on our provider's assessment of the scan. We will be in touch to make sure you have no questions and to schedule the recommended 3 month follow up scan.  Lung RADS 4 B:  indicates findings that are concerning. We will be in touch with you to schedule additional diagnostic testing based on our provider's  assessment of the scan.   

## 2020-10-31 NOTE — Patient Instructions (Addendum)
Labs done today. We will contact you only if your labs are abnormal.  STOP taking Digoxin  STOP taking Losartan  START Entresto 24-26mg  (1 tablet) by mouth 2 times daily.   No other medication changes were made. Please continue all current medications as prescribed.  Please call ENT to schedule an appointment.   You may stop wearing your Life Vest.  Your physician recommends that you schedule a follow-up appointment in: 10 days for a lab only appointment in [place](a paper prescription was provided to you during your appointment today.) and in 3 months with Dr. Shirlee Latch  If you have any questions or concerns before your next appointment please send [place] a message through [facility] or call our office at [phone].    TO LEAVE A MESSAGE FOR THE NURSE SELECT OPTION 2, PLEASE LEAVE A MESSAGE INCLUDING: YOUR NAME DATE OF BIRTH CALL BACK NUMBER REASON FOR CALL**this is important as we prioritize the call backs  YOU WILL RECEIVE A CALL BACK THE SAME DAY AS LONG AS YOU CALL BEFORE 4:00 PM   Do the following things EVERYDAY: Weigh yourself in the morning before breakfast. Write it down and keep it in a log. Take your medicines as prescribed Eat low salt foods--Limit salt (sodium) to 2000 mg per day.  Stay as active as you can everyday Limit all fluids for the day to less than 2 liters   At the [facility], you and your health needs are our priority. As part of our continuing mission to provide you with exceptional heart care, we have created designated Provider Care Teams. These Care Teams include your primary Cardiologist (physician) and Advanced Practice Providers (APPs- Physician Assistants and Nurse Practitioners) who all work together to provide you with the care you need, when you need it.   You may see any of the following providers on your designated Care Team at your next follow up: Dr Arvilla Meres Dr Carron Curie, NP Robbie Lis,  [place] Karle Plumber, PharmD   Please be sure to bring in all your medications bottles to every appointment.

## 2020-10-31 NOTE — Progress Notes (Signed)
Virtual Visit via Video Note  I connected with [name] on 10/31/20 at 11:00 AM EDT by a video enabled telemedicine application and verified that I am speaking with the correct person using two identifiers.  Location: Patient: In his car, [place] Provider: [phone] [address] [place], [place], [address]    I discussed the limitations of evaluation and management by telemedicine and the availability of in person appointments. The patient expressed understanding and agreed to proceed.   Shared Decision Making Visit Lung Cancer Screening Program [phone])   Eligibility: Age 55 y.o. Pack Years Smoking History Calculation 100 pack year smoking history (# packs/per year x # years smoked) Recent History of coughing up blood  no Unexplained weight loss? no ( >Than 15 pounds within the last 6 months ) Prior History Lung / other cancer no (Diagnosis within the last 5 years already requiring surveillance chest CT Scans). Smoking Status Current Smoker Former Smokers: Years since quit:  NA  Quit Date: NA  Visit Components: Discussion included one or more decision making aids. yes Discussion included risk/benefits of screening. yes Discussion included potential follow up diagnostic testing for abnormal scans. yes Discussion included meaning and risk of over diagnosis. yes Discussion included meaning and risk of False Positives. yes Discussion included meaning of total radiation exposure. yes  Counseling Included: Importance of adherence to annual lung cancer LDCT screening. yes Impact of comorbidities on ability to participate in the program. yes Ability and willingness to under diagnostic treatment. yes  Smoking Cessation Counseling: Current Smokers:  Discussed importance of smoking cessation. yes Information about tobacco cessation classes and interventions provided to patient. yes Patient provided with "ticket" for LDCT Scan. yes Symptomatic Patient.  No  Counseling(Intermediate counseling: > three minutes) 99406 Diagnosis Code: NA Asymptomatic Patient yes  Counseling (Intermediate counseling: > three minutes counseling) [id number] Former Smokers:  Discussed the importance of maintaining cigarette abstinence. yes Diagnosis Code: Personal History of Nicotine Dependence. X83.382 Information about tobacco cessation classes and interventions provided to patient. Yes Patient provided with "ticket" for LDCT Scan. yes Written Order for Lung Cancer Screening with LDCT placed in Epic. Yes (CT Chest Lung Cancer Screening Low Dose W/O CM) [id number] Z12.2-Screening of respiratory organs Z87.891-Personal history of nicotine dependence  I have spent 25 minutes of face to face time with [name] discussing the risks and benefits of lung cancer screening. We viewed a power point together that explained in detail the above noted topics. We paused at intervals to allow for questions to be asked and answered to ensure understanding.We discussed that the single most powerful action that he can take to decrease his risk of developing lung cancer is to quit smoking. We discussed whether or not he is ready to commit to setting a quit date. We discussed options for tools to aid in quitting smoking including nicotine replacement therapy, non-nicotine medications, support groups, Quit Smart classes, and behavior modification. We discussed that often times setting smaller, more achievable goals, such as eliminating 1 cigarette a day for a week and then 2 cigarettes a day for a week can be helpful in slowly decreasing the number of cigarettes smoked. This allows for a sense of accomplishment as well as providing a clinical benefit. I gave him the " Be Stronger Than Your Excuses" card with contact information for community resources, classes, free nicotine replacement therapy, and access to mobile apps, text messaging, and on-line smoking cessation help. I have also given him my  card and contact information  in the event he needs to contact me. We discussed the time and location of the scan, and that either Abigail Miyamoto RN or I will call with the results within 24-48 hours of receiving them. I have offered him  a copy of the power point we viewed  as a resource in the event they need reinforcement of the concepts we discussed today in the office. The patient verbalized understanding of all of  the above and had no further questions upon leaving the office. They have my contact information in the event they have any further questions.  I spent 3 minutes counseling on smoking cessation and the health risks of continued tobacco abuse.  I explained to the patient that there has been a high incidence of coronary artery disease noted on these exams. I explained that this is a non-gated exam therefore degree or severity cannot be determined. This patient is on statin therapy. I have asked the patient to follow-up with their PCP regarding any incidental finding of coronary artery disease and management with diet or medication as their PCP  feels is clinically indicated. The patient verbalized understanding of the above and had no further questions upon completion of the visit.   Pt. Is down to 5 cigarettes a day.    Bevelyn Ngo, NP

## 2020-10-31 NOTE — Progress Notes (Signed)
ADVANCED HF CLINIC CONSULT NOTE  PCP: Default, Provider, MD Cardiology: Dr. Shirlee Latch  HPI: [name] is a 55 y.o. with a history of CAD, DES to LAD 2009 and 04/2020 LAD, methamphetamine abuse quit in 2012, chronic systolic heart failure, and tobacco abuse.   Admitted 04/22/20 with chest pain--> anterolatelal STEMI. Taken for cath and had total occlusion mid LAD with subsequent PCI to LAD. Echo in 2/22 showed EF 25-30%. Discharged on 04/25/20 with Lifevest.  Repeat echo in 7/22 showed EF up to 40-45% with normal RV.  PFTs were done showing obstruction at the level of the throat, ENT consultation recommended. He is supposed to see an ENT in [place] for evaluation.   Patient returns for followup of CAD and CHF. He is still smoking about 5 cigarettes/day, down from 3 ppd. Wellbutrin has helped.  He is still wearing Lifevest.  No dyspnea walking on flat ground.  He is short of breath walking up stairs.  No orthopnea/PND.  No chest pain.  He is ready to go back to work. Weight stable.    Labs (3/22): K 5.1, creatinine 0.97 Labs (5/22): K 4.3, creatinine 0.87, LDL 45, TGs 221, BNP 30, digoxin 0.5  PMH: 1. Active smoker with suspected COPD.  PFTs showed obstruction at the level of the throat, ENT appt recommended.  2. CAD: 2009 PCI to mid LAD.  - 2/22 anterolateral STEMI: mLAD occluded just beyond prior stent, treated with PCI to mLAD.  3. Chronic systolic CHF: Ischemic cardiomyopathy.  Echo (2/22) with EF 25-30% with LAD-territory WMAs, normal RV.  - Echo (7/22): EF 40-45%, normal RV.  4. H/o methamphetamine use, quit 2012.  5. Hyperlipidemia  ROS: All systems reviewed and negative except as per HPI.    Current Outpatient Medications  Medication Sig Dispense Refill   aspirin EC 81 MG tablet Take 1 tablet (81 mg total) by mouth daily. 90 tablet 3   atorvastatin (LIPITOR) 80 MG tablet Take 1 tablet (80 mg total) by mouth daily. 90 tablet 3   buPROPion (WELLBUTRIN SR) 150 MG 12 hr tablet Take  1 tablet (150 mg total) by mouth 2 (two) times daily. 60 tablet 2   carvedilol (COREG) 3.125 MG tablet Take 1 tablet (3.125 mg total) by mouth 2 (two) times daily with a meal. 180 tablet 3   clopidogrel (PLAVIX) 75 MG tablet Take 1 tablet (75 mg total) by mouth daily. 90 tablet 3   dapagliflozin propanediol (FARXIGA) 10 MG TABS tablet Take 1 tablet (10 mg total) by mouth daily. 90 tablet 3   nitroGLYCERIN (NITROSTAT) 0.4 MG SL tablet Place 1 tablet (0.4 mg total) under the tongue every 5 (five) minutes as needed. 25 tablet 3   spironolactone (ALDACTONE) 25 MG tablet Take 1 tablet (25 mg total) by mouth daily. 90 tablet 3   No current facility-administered medications for this encounter.    No Known Allergies    Social History   Socioeconomic History   Marital status: Married    Spouse name: Kyng Matlock   Number of children: Not on file   Years of education: Not on file   Highest education level: Not on file  Occupational History   Not on file  Tobacco Use   Smoking status: Light Smoker    Packs/day: 0.50    Years: 51.00    Pack years: 25.50    Types: Cigarettes   Smokeless tobacco: Never   Tobacco comments:    Currently 6-8 cigs per day  Vaping  Use   Vaping Use: Never used  Substance and Sexual Activity   Alcohol use: No   Drug use: Not Currently    Types: Methamphetamines    Comment: previously used methamphetamines - quit 09/2010   Sexual activity: Yes    Partners: Female    Birth control/protection: None  Other Topics Concern   Not on file  Social History Narrative   Lives in [place].  Step-dtr near-by.  Works for a [employer].   Social Determinants of Health   Financial Resource Strain: High Risk   Difficulty of Paying Living Expenses: Very hard  Food Insecurity: Food Insecurity Present   Worried About Programme researcher, broadcasting/film/video in the Last Year: Sometimes true   Ran Out of Food in the Last Year: Sometimes true  Transportation Needs: No Transportation Needs    Lack of Transportation (Medical): No   Lack of Transportation (Non-Medical): No  Physical Activity: Insufficiently Active   Days of Exercise per Week: 3 days   Minutes of Exercise per Session: 20 min  Stress: Not on file  Social Connections: Not on file  Intimate Partner Violence: Not At Risk   Fear of Current or Ex-Partner: No   Emotionally Abused: No   Physically Abused: No   Sexually Abused: No      Family History  Problem Relation Age of Onset   Heart attack Father        died @ 47   Coronary artery disease Mother        alive in late 70's    Vitals:   10/31/20 0946  BP: 130/88  Pulse: 75  SpO2: 98%  Weight: 75.5 kg (166 lb 6.4 oz)    PHYSICAL EXAM: General: NAD Neck: No JVD, no thyromegaly or thyroid nodule.  Lungs: Occasional rhonchi.  CV: Nondisplaced PMI.  Heart regular S1/S2, no S3/S4, no murmur.  No peripheral edema.  No carotid bruit.  Normal pedal pulses.  Abdomen: Soft, nontender, no hepatosplenomegaly, no distention.  Skin: Intact without lesions or rashes.  Neurologic: Alert and oriented x 3.  Psych: Normal affect. Extremities: No clubbing or cyanosis.  HEENT: Normal.   ASSESSMENT & PLAN:  1. Chronic systolic CHF: Ischemic cardiomyopathy. Echo 04/2020 with EF 25-30% RV normal.  Repeat echo in 7/22 showed EF up to 40-45%.  NYHA class II symptoms, not volume overloaded on exam. He is doing cardiac rehab.  - Continue Coreg 3.125 mg twice a day - With improved EF, he can stop digoxin.   - Stop losartan today, start Entresto 24/26 bid.  BMET today and again in 10 days.   - Continue spironolactone 25 mg daily.    - Continue Farxiga 10 mg daily  - EF is out of ICD range, he can take off Lifevest.  2. CAD: DES to mLAD in 2009.  Anterolateral STEMI in 2/22 with DES to mLAD.  No chest pain.  - Continue ASA 81 and Plavix 75 daily.  - Continue atorvastatin 80 daily.  3. Tobacco Abuse: Discussed smoking cessation. He is trying to cut back, Wellbutrin is  helping.   - Continue Wellbutrin.  4. Abnormal PFTs: Concern for obstruction in the upper airways/oropharynx, ENT evaluation recommended. Patient says he is going to see an ENT surgeon in [place].    Followup in 3 months.  Marca Ancona 10/31/2020

## 2020-11-02 ENCOUNTER — Telehealth: Payer: Self-pay | Admitting: Acute Care

## 2020-11-02 ENCOUNTER — Encounter (HOSPITAL_COMMUNITY)
Admission: RE | Admit: 2020-11-02 | Discharge: 2020-11-02 | Disposition: A | Discharge status: Home / Self Care | Payer: BC Managed Care – PPO | Source: Ambulatory Visit | Attending: Cardiovascular Disease | Admitting: Cardiovascular Disease

## 2020-11-02 ENCOUNTER — Other Ambulatory Visit: Payer: Self-pay

## 2020-11-02 DIAGNOSIS — F17210 Nicotine dependence, cigarettes, uncomplicated: Secondary | ICD-10-CM

## 2020-11-02 DIAGNOSIS — Z955 Presence of coronary angioplasty implant and graft: Secondary | ICD-10-CM

## 2020-11-02 DIAGNOSIS — Z87891 Personal history of nicotine dependence: Secondary | ICD-10-CM

## 2020-11-02 DIAGNOSIS — R911 Solitary pulmonary nodule: Secondary | ICD-10-CM

## 2020-11-02 DIAGNOSIS — I2102 ST elevation (STEMI) myocardial infarction involving left anterior descending coronary artery: Secondary | ICD-10-CM | POA: Diagnosis not present

## 2020-11-02 NOTE — Telephone Encounter (Signed)
Received call report of LCS CT as follows  1. Smoothly marginated nodule in the anterior aspect of the left lower lobe with a mean diameter of 8.9 mm categorized as Lung-RADS 4AS, suspicious. Follow up low-dose chest CT without contrast in 3 months (please use the following order, "CT CHEST LCS NODULE FOLLOW-UP W/O CM") is recommended. Alternatively, PET may be considered when there is a solid component 109mm or larger. 2. The "S" modifier above refers to potentially clinically significant non lung cancer related findings. Specifically, there is aortic atherosclerosis, in addition to left anterior descending coronary artery disease. Please note that although the presence of coronary artery calcium documents the presence of coronary artery disease, the severity of this disease and any potential stenosis cannot be assessed on this non-gated CT examination. Assessment for potential risk factor modification, dietary therapy or pharmacologic therapy may be warranted, if clinically indicated. 3. Mild diffuse bronchial wall thickening with mild centrilobular and paraseptal emphysema; imaging findings suggestive of underlying COPD.  [name] please advise. Thank you

## 2020-11-02 NOTE — Progress Notes (Signed)
Daily Session Note  Patient Details  Name: [name] MRN: [id number] Date of Birth: [birth year] Referring Provider:   Flowsheet Row CARDIAC REHAB PHASE II ORIENTATION from 10/01/2020 in [facility]  Referring Provider Dr. Claiborne Billings       Encounter Date: 11/02/2020  Check In:  Session Check In - 11/02/20 0815       Check-In   Supervising physician immediately available to respond to emergencies [facility] MD immediately available    Physician(s) Dr. Domenic Polite    Location [facility]    Staff Present Hoy Register, MS, ACSM-CEP, Exercise Physiologist;Other    Virtual Visit No    Medication changes reported     No    Fall or balance concerns reported    No    Tobacco Cessation No Change    Warm-up and Cool-down Performed as group-led instruction    Resistance Training Performed Yes    VAD Patient? No    PAD/SET Patient? No      Pain Assessment   Currently in Pain? No/denies    Pain Score 0-No pain    Multiple Pain Sites No             Capillary Blood Glucose: No results found for this or any previous visit (from the past 24 hour(s)).    Social History   Tobacco Use  Smoking Status Light Smoker   Packs/day: 0.50   Years: 51.00   Pack years: 25.50   Types: Cigarettes  Smokeless Tobacco Never  Tobacco Comments   Currently 6-8 cigs per day    Goals Met:  Independence with exercise equipment Exercise tolerated well No report of concerns or symptoms today Strength training completed today  Goals Unmet:  Not Applicable  Comments: checkout time is 0915   Dr. Kathie Dike is Medical Director for [facility].

## 2020-11-05 ENCOUNTER — Encounter (HOSPITAL_COMMUNITY): Payer: BC Managed Care – PPO

## 2020-11-07 ENCOUNTER — Encounter (HOSPITAL_COMMUNITY)
Admission: RE | Admit: 2020-11-07 | Discharge: 2020-11-07 | Disposition: A | Discharge status: Home / Self Care | Payer: BC Managed Care – PPO | Source: Ambulatory Visit | Attending: Cardiovascular Disease | Admitting: Cardiovascular Disease

## 2020-11-07 ENCOUNTER — Other Ambulatory Visit: Payer: Self-pay

## 2020-11-07 DIAGNOSIS — D225 Melanocytic nevi of trunk: Secondary | ICD-10-CM | POA: Diagnosis not present

## 2020-11-07 DIAGNOSIS — L821 Other seborrheic keratosis: Secondary | ICD-10-CM | POA: Diagnosis not present

## 2020-11-07 DIAGNOSIS — I2102 ST elevation (STEMI) myocardial infarction involving left anterior descending coronary artery: Secondary | ICD-10-CM | POA: Diagnosis not present

## 2020-11-07 DIAGNOSIS — Z955 Presence of coronary angioplasty implant and graft: Secondary | ICD-10-CM | POA: Diagnosis not present

## 2020-11-07 DIAGNOSIS — L918 Other hypertrophic disorders of the skin: Secondary | ICD-10-CM | POA: Diagnosis not present

## 2020-11-07 DIAGNOSIS — D485 Neoplasm of uncertain behavior of skin: Secondary | ICD-10-CM | POA: Diagnosis not present

## 2020-11-07 NOTE — Progress Notes (Signed)
Daily Session Note  Patient Details  Name: [name] MRN: [id number] Date of Birth: 07/23/1965 Referring Provider:   Flowsheet Row CARDIAC REHAB PHASE II ORIENTATION from 10/01/2020 in [facility]  Referring Provider Dr. Kelly       Encounter Date: 11/07/2020  Check In:  Session Check In - 11/07/20 0815       Check-In   Supervising physician immediately available to respond to emergencies [facility] MD immediately available    Physician(s) Dr. Branch    Location [facility]    Staff Present Dalton Fletcher, MS, ACSM-CEP, Exercise Physiologist;Debra Johnson, RN, BSN    Virtual Visit No    Medication changes reported     No    Fall or balance concerns reported    No    Tobacco Cessation No Change    Warm-up and Cool-down Performed as group-led instruction    Resistance Training Performed Yes    VAD Patient? No    PAD/SET Patient? No      Pain Assessment   Currently in Pain? No/denies    Pain Score 0-No pain    Multiple Pain Sites No             Capillary Blood Glucose: No results found for this or any previous visit (from the past 24 hour(s)).    Social History   Tobacco Use  Smoking Status Light Smoker   Packs/day: 0.50   Years: 51.00   Pack years: 25.50   Types: Cigarettes  Smokeless Tobacco Never  Tobacco Comments   Currently 6-8 cigs per day    Goals Met:  Independence with exercise equipment Exercise tolerated well No report of concerns or symptoms today Strength training completed today  Goals Unmet:  Not Applicable  Comments: checkout time is 0915   Dr. Jehanzeb Memon is Medical Director for [facility]. 

## 2020-11-07 NOTE — Progress Notes (Signed)
I have called the patient with the results of his low dose CT. I explained that his scan was read as a Lung RADS 4 A : suspicious findings, either short term follow up in 3 months or alternatively  PET Scan evaluation may be considered when there is a solid component of  8 mm or larger. I explained that we need to do a follow up Low Dose CT in 3 months to assess stability of the nodule that currently measures 8.9 mm. He is in agreement with this plan.  Angelique Blonder, please fax results to PCP and order follow up low dose CT in 3 months. Thanks so much

## 2020-11-08 NOTE — Telephone Encounter (Signed)
CT results faxed to Dr Sherene Sires as pt has no PCP on file and Dr Sherene Sires was referring physician to lung cancer screening . Order placed for 3 mth nodule f/u CT.

## 2020-11-09 ENCOUNTER — Encounter (HOSPITAL_COMMUNITY)
Admission: RE | Admit: 2020-11-09 | Discharge: 2020-11-09 | Disposition: A | Discharge status: Home / Self Care | Payer: BC Managed Care – PPO | Source: Ambulatory Visit | Attending: Cardiovascular Disease | Admitting: Cardiovascular Disease

## 2020-11-09 ENCOUNTER — Other Ambulatory Visit: Payer: Self-pay

## 2020-11-09 DIAGNOSIS — I2102 ST elevation (STEMI) myocardial infarction involving left anterior descending coronary artery: Secondary | ICD-10-CM

## 2020-11-09 DIAGNOSIS — Z955 Presence of coronary angioplasty implant and graft: Secondary | ICD-10-CM | POA: Diagnosis not present

## 2020-11-09 NOTE — Progress Notes (Signed)
Daily Session Note  Patient Details  Name: [name] MRN: [id number] Date of Birth: [birth year] Referring Provider:   Flowsheet Row CARDIAC REHAB PHASE II ORIENTATION from 10/01/2020 in [facility]  Referring Provider Dr. Claiborne Billings       Encounter Date: 11/09/2020  Check In:  Session Check In - 11/09/20 0815       Check-In   Supervising physician immediately available to respond to emergencies [facility] MD immediately available    Physician(s) Dr. Harl Bowie    Location [facility]    Staff Present Geanie Cooley, Kermit Balo, RN, BSN    Virtual Visit No    Medication changes reported     No    Fall or balance concerns reported    No    Tobacco Cessation No Change    Warm-up and Cool-down Performed as group-led instruction    Resistance Training Performed Yes    VAD Patient? No    PAD/SET Patient? No      Pain Assessment   Currently in Pain? No/denies    Pain Score 0-No pain    Multiple Pain Sites No             Capillary Blood Glucose: No results found for this or any previous visit (from the past 24 hour(s)).    Social History   Tobacco Use  Smoking Status Light Smoker   Packs/day: 0.50   Years: 51.00   Pack years: 25.50   Types: Cigarettes  Smokeless Tobacco Never  Tobacco Comments   Currently 6-8 cigs per day    Goals Met:  Independence with exercise equipment Exercise tolerated well No report of concerns or symptoms today Strength training completed today  Goals Unmet:  Not Applicable  Comments: check out @ 9:15am   Dr. Kathie Dike is Medical Director for [facility].

## 2020-11-12 ENCOUNTER — Other Ambulatory Visit: Payer: Self-pay

## 2020-11-12 ENCOUNTER — Encounter (HOSPITAL_COMMUNITY)
Admission: RE | Admit: 2020-11-12 | Discharge: 2020-11-12 | Disposition: A | Discharge status: Home / Self Care | Payer: BC Managed Care – PPO | Source: Ambulatory Visit | Attending: Cardiovascular Disease | Admitting: Cardiovascular Disease

## 2020-11-12 VITALS — Wt 163.4 lb

## 2020-11-12 DIAGNOSIS — I2102 ST elevation (STEMI) myocardial infarction involving left anterior descending coronary artery: Secondary | ICD-10-CM | POA: Diagnosis not present

## 2020-11-12 DIAGNOSIS — Z955 Presence of coronary angioplasty implant and graft: Secondary | ICD-10-CM

## 2020-11-12 NOTE — Progress Notes (Signed)
Daily Session Note  Patient Details  Name: [name] MRN: [id number] Date of Birth: 1966-02-11 Referring Provider:   Flowsheet Row CARDIAC REHAB PHASE II ORIENTATION from 10/01/2020 in [facility]  Referring Provider Dr. Claiborne Billings       Encounter Date: 11/12/2020  Check In:  Session Check In - 11/12/20 0815       Check-In   Supervising physician immediately available to respond to emergencies [facility] MD immediately available    Physician(s) Dr. Domenic Polite    Location [facility]    Staff Present Geanie Cooley, RN;Debra Wynetta Emery, RN, BSN    Virtual Visit No    Medication changes reported     No    Fall or balance concerns reported    No    Tobacco Cessation No Change    Warm-up and Cool-down Performed as group-led instruction    Resistance Training Performed Yes    PAD/SET Patient? No      Pain Assessment   Currently in Pain? No/denies    Pain Score 0-No pain    Multiple Pain Sites No             Capillary Blood Glucose: No results found for this or any previous visit (from the past 24 hour(s)).    Social History   Tobacco Use  Smoking Status Light Smoker   Packs/day: 0.50   Years: 51.00   Pack years: 25.50   Types: Cigarettes  Smokeless Tobacco Never  Tobacco Comments   Currently 6-8 cigs per day    Goals Met:  Independence with exercise equipment Exercise tolerated well No report of concerns or symptoms today Strength training completed today  Goals Unmet:  Not Applicable  Comments: check out @ 9:15am   Dr. Kathie Dike is Medical Director for [facility].

## 2020-11-13 ENCOUNTER — Other Ambulatory Visit (HOSPITAL_COMMUNITY): Payer: Self-pay

## 2020-11-13 MED ORDER — BUPROPION HCL ER (SR) 150 MG PO TB12: 150.0000 mg | ORAL_TABLET | Freq: Two times a day (BID) | ORAL | 3 refills | Status: DC

## 2020-11-14 ENCOUNTER — Other Ambulatory Visit: Payer: Self-pay

## 2020-11-14 ENCOUNTER — Encounter (HOSPITAL_COMMUNITY)
Admission: RE | Admit: 2020-11-14 | Discharge: 2020-11-14 | Disposition: A | Discharge status: Home / Self Care | Payer: BC Managed Care – PPO | Source: Ambulatory Visit | Attending: Cardiovascular Disease | Admitting: Cardiovascular Disease

## 2020-11-14 DIAGNOSIS — I2102 ST elevation (STEMI) myocardial infarction involving left anterior descending coronary artery: Secondary | ICD-10-CM

## 2020-11-14 DIAGNOSIS — Z955 Presence of coronary angioplasty implant and graft: Secondary | ICD-10-CM

## 2020-11-14 NOTE — Progress Notes (Signed)
Cardiac Individual Treatment Plan  Patient Details  Name: [name] MRN: [id number] Date of Birth: 1965/09/19 Referring Provider:   Flowsheet Row CARDIAC REHAB PHASE II ORIENTATION from 10/01/2020 in [facility] CARDIAC REHABILITATION  Referring Provider Dr. Tresa Endo       Initial Encounter Date:  Flowsheet Row CARDIAC REHAB PHASE II ORIENTATION from 10/01/2020 in [place] CARDIAC REHABILITATION  Date 10/01/20       Visit Diagnosis: ST elevation myocardial infarction involving left anterior descending (LAD) coronary artery [facility])  Status post coronary artery stent placement  Patient's Home Medications on Admission:  Current Outpatient Medications:    aspirin EC 81 MG tablet, Take 1 tablet (81 mg total) by mouth daily., Disp: 90 tablet, Rfl: 3   atorvastatin (LIPITOR) 80 MG tablet, Take 1 tablet (80 mg total) by mouth daily., Disp: 90 tablet, Rfl: 3   buPROPion (WELLBUTRIN SR) 150 MG 12 hr tablet, Take 1 tablet (150 mg total) by mouth 2 (two) times daily., Disp: 60 tablet, Rfl: 3   carvedilol (COREG) 3.125 MG tablet, Take 1 tablet (3.125 mg total) by mouth 2 (two) times daily with a meal., Disp: 180 tablet, Rfl: 3   clopidogrel (PLAVIX) 75 MG tablet, Take 1 tablet (75 mg total) by mouth daily., Disp: 90 tablet, Rfl: 3   dapagliflozin propanediol (FARXIGA) 10 MG TABS tablet, Take 1 tablet (10 mg total) by mouth daily., Disp: 90 tablet, Rfl: 3   nitroGLYCERIN (NITROSTAT) 0.4 MG SL tablet, Place 1 tablet (0.4 mg total) under the tongue every 5 (five) minutes as needed., Disp: 25 tablet, Rfl: 3   spironolactone (ALDACTONE) 25 MG tablet, Take 1 tablet (25 mg total) by mouth daily., Disp: 90 tablet, Rfl: 3  Past Medical History: Past Medical History:  Diagnosis Date   Coronary arteriosclerosis    a. PCI LAD 2009 in [place];  b. 12/2010 Cath: LM: nl, LAD: patent stent mid, 25 mid, LCX nl, RCA: non dominant, nl, EF 50%   History of methamphetamine abuse (HCC)    a. Used x 6 yrs, quit July  2012   History of tobacco abuse    a. 41 yrs, up to 3 ppd, quit July 2012   Hypercholesterolemia     Tobacco Use: Social History   Tobacco Use  Smoking Status Light Smoker   Packs/day: 0.50   Years: 51.00   Pack years: 25.50   Types: Cigarettes  Smokeless Tobacco Never  Tobacco Comments   Currently 6-8 cigs per day    Labs: Recent Review Flowsheet Data     Labs for ITP Cardiac and Pulmonary Rehab Latest Ref Rng & Units 12/20/2010 04/22/2020 04/23/2020 04/24/2020 07/27/2020   Cholestrol 0 - 200 mg/dL 389 - 373 - 428   LDLCALC 0 - 99 mg/dL 64 - 89 - 45   HDL >76 mg/dL 81(L) - 57(W) - 62(M)   Trlycerides <150 mg/dL 355 - 79 - 974(B)   Hemoglobin A1c 4.8 - 5.6 % - - - 5.4 -   TCO2 22 - 32 mmol/L - 23 - - -       Capillary Blood Glucose: No results found for: GLUCAP   Exercise Target Goals: Exercise Program Goal: Individual exercise prescription set using results from initial 6 min walk test and THRR while considering  patient's activity barriers and safety.   Exercise Prescription Goal: Starting with aerobic activity 30 plus minutes a day, 3 days per week for initial exercise prescription. Provide home exercise prescription and guidelines that participant acknowledges  understanding prior to discharge.  Activity Barriers & Risk Stratification:  Activity Barriers & Cardiac Risk Stratification - 10/01/20 1255       Activity Barriers & Cardiac Risk Stratification   Activity Barriers Shortness of Breath;Decreased Ventricular Function    Cardiac Risk Stratification High             6 Minute Walk:  6 Minute Walk     Row Name 10/01/20 1421         6 Minute Walk   Phase Initial     Distance 1300 feet     Walk Time 6 minutes     # of Rest Breaks 0     MPH 2.46     METS 3.62     RPE 11     VO2 Peak 12.65     Symptoms Yes (comment)     Comments Right hip pain 4/10     Resting HR 74 bpm     Resting BP 90/60     Resting Oxygen Saturation  98 %     Exercise  Oxygen Saturation  during 6 min walk 99 %     Max Ex. HR 80 bpm     Max Ex. BP 98/70     2 Minute Post BP 86/58              Oxygen Initial Assessment:   Oxygen Re-Evaluation:   Oxygen Discharge (Final Oxygen Re-Evaluation):   Initial Exercise Prescription:  Initial Exercise Prescription - 10/01/20 1400       Date of Initial Exercise RX and Referring Provider   Date 10/01/20    Referring Provider Dr. Tresa Endo    Expected Discharge Date 12/24/20      Treadmill   MPH 1.5    Grade 0    Minutes 17      NuStep   Level 1    SPM 80    Minutes 22      Prescription Details   Frequency (times per week) 3    Duration Progress to 30 minutes of continuous aerobic without signs/symptoms of physical distress      Intensity   THRR 40-80% of Max Heartrate 65-130    Ratings of Perceived Exertion 11-13    Perceived Dyspnea 0-4      Resistance Training   Training Prescription Yes    Weight 4 lbs    Reps 10-15             Perform Capillary Blood Glucose checks as needed.  Exercise Prescription Changes:   Exercise Prescription Changes     Row Name 10/15/20 1100 10/29/20 0815 11/12/20 0815         Response to Exercise   Blood Pressure (Admit) 104/66 90/60 128/70     Blood Pressure (Exercise) 98/70 100/60 128/60     Blood Pressure (Exit) 98/62 88/48 102/60     Heart Rate (Admit) 67 bpm 87 bpm 73 bpm     Heart Rate (Exercise) 107 bpm 103 bpm 102 bpm     Heart Rate (Exit) 76 bpm 96 bpm 84 bpm     Rating of Perceived Exertion (Exercise) 12 12 12      Duration Continue with 30 min of aerobic exercise without signs/symptoms of physical distress. Continue with 30 min of aerobic exercise without signs/symptoms of physical distress. Continue with 30 min of aerobic exercise without signs/symptoms of physical distress.     Intensity THRR unchanged THRR unchanged THRR unchanged  Progression   Progression Continue to progress workloads to maintain intensity without  signs/symptoms of physical distress. Continue to progress workloads to maintain intensity without signs/symptoms of physical distress. Continue to progress workloads to maintain intensity without signs/symptoms of physical distress.           Resistance Training   Training Prescription Yes Yes Yes     Weight 4 lbs 4 lbs 4 lbs     Reps 10-15 10-15 10-15     Time -- 10 Minutes 10 Minutes           Treadmill   MPH 2.4 2.5 2.4     Grade 4 0 2     Minutes METs 4.16 2.91 3.5           NuStep   Level SPM 119 122 96     Minutes METs 3.52 3.69 2.22              Exercise Comments:   Exercise Goals and Review:   Exercise Goals     Row Name 10/01/20 1432 10/16/20 0833 11/13/20 0922         Exercise Goals   Increase Physical Activity Yes Yes Yes     Intervention Provide advice, education, support and counseling about physical activity/exercise needs.;Develop an individualized exercise prescription for aerobic and resistive training based on initial evaluation findings, risk stratification, comorbidities and participant's personal goals. Provide advice, education, support and counseling about physical activity/exercise needs.;Develop an individualized exercise prescription for aerobic and resistive training based on initial evaluation findings, risk stratification, comorbidities and participant's personal goals. Provide advice, education, support and counseling about physical activity/exercise needs.;Develop an individualized exercise prescription for aerobic and resistive training based on initial evaluation findings, risk stratification, comorbidities and participant's personal goals.     Expected Outcomes Short Term: Attend rehab on a regular basis to increase amount of physical activity.;Long Term: Add in home exercise to make exercise part of routine and to increase amount of physical activity.;Long Term: Exercising regularly at least 3-5 days a  week. Short Term: Attend rehab on a regular basis to increase amount of physical activity.;Long Term: Add in home exercise to make exercise part of routine and to increase amount of physical activity.;Long Term: Exercising regularly at least 3-5 days a week. Short Term: Attend rehab on a regular basis to increase amount of physical activity.;Long Term: Add in home exercise to make exercise part of routine and to increase amount of physical activity.;Long Term: Exercising regularly at least 3-5 days a week.     Increase Strength and Stamina Yes Yes Yes     Intervention Provide advice, education, support and counseling about physical activity/exercise needs.;Develop an individualized exercise prescription for aerobic and resistive training based on initial evaluation findings, risk stratification, comorbidities and participant's personal goals. Provide advice, education, support and counseling about physical activity/exercise needs.;Develop an individualized exercise prescription for aerobic and resistive training based on initial evaluation findings, risk stratification, comorbidities and participant's personal goals. Provide advice, education, support and counseling about physical activity/exercise needs.;Develop an individualized exercise prescription for aerobic and resistive training based on initial evaluation findings, risk stratification, comorbidities and participant's personal goals.     Expected Outcomes Short Term: Increase workloads from initial exercise prescription for resistance, speed, and METs.;Short Term: Perform resistance training exercises routinely during rehab and add in resistance training at home;Long Term: Improve cardiorespiratory  fitness, muscular endurance and strength as measured by increased METs and functional capacity ( ) Short Term: Increase workloads from initial exercise prescription for resistance, speed, and METs.;Short Term: Perform resistance training exercises routinely  during rehab and add in resistance training at home;Long Term: Improve cardiorespiratory fitness, muscular endurance and strength as measured by increased METs and functional capacity ( ) Short Term: Increase workloads from initial exercise prescription for resistance, speed, and METs.;Short Term: Perform resistance training exercises routinely during rehab and add in resistance training at home;Long Term: Improve cardiorespiratory fitness, muscular endurance and strength as measured by increased METs and functional capacity ( )     Able to understand and use rate of perceived exertion (RPE) scale Yes Yes Yes     Intervention Provide education and explanation on how to use RPE scale Provide education and explanation on how to use RPE scale Provide education and explanation on how to use RPE scale     Expected Outcomes Short Term: Able to use RPE daily in rehab to express subjective intensity level;Long Term:  Able to use RPE to guide intensity level when exercising independently Short Term: Able to use RPE daily in rehab to express subjective intensity level;Long Term:  Able to use RPE to guide intensity level when exercising independently Short Term: Able to use RPE daily in rehab to express subjective intensity level;Long Term:  Able to use RPE to guide intensity level when exercising independently     Knowledge and understanding of Target Heart Rate Range (THRR) Yes Yes Yes     Intervention Provide education and explanation of THRR including how the numbers were predicted and where they are located for reference Provide education and explanation of THRR including how the numbers were predicted and where they are located for reference Provide education and explanation of THRR including how the numbers were predicted and where they are located for reference     Expected Outcomes Short Term: Able to state/look up THRR;Long Term: Able to use THRR to govern intensity when exercising independently;Short  Term: Able to use daily as guideline for intensity in rehab Short Term: Able to state/look up THRR;Long Term: Able to use THRR to govern intensity when exercising independently;Short Term: Able to use daily as guideline for intensity in rehab Short Term: Able to state/look up THRR;Long Term: Able to use THRR to govern intensity when exercising independently;Short Term: Able to use daily as guideline for intensity in rehab     Able to check pulse independently Yes Yes Yes     Intervention Provide education and demonstration on how to check pulse in carotid and radial arteries.;Review the importance of being able to check your own pulse for safety during independent exercise Provide education and demonstration on how to check pulse in carotid and radial arteries.;Review the importance of being able to check your own pulse for safety during independent exercise Provide education and demonstration on how to check pulse in carotid and radial arteries.;Review the importance of being able to check your own pulse for safety during independent exercise     Expected Outcomes Short Term: Able to explain why pulse checking is important during independent exercise;Long Term: Able to check pulse independently and accurately Short Term: Able to explain why pulse checking is important during independent exercise;Long Term: Able to check pulse independently and accurately Short Term: Able to explain why pulse checking is important during independent exercise;Long Term: Able to check pulse independently and accurately     Understanding of Exercise Prescription Yes Yes  Yes     Intervention Provide education, explanation, and written materials on patient's individual exercise prescription Provide education, explanation, and written materials on patient's individual exercise prescription Provide education, explanation, and written materials on patient's individual exercise prescription     Expected Outcomes Short Term: Able to  explain program exercise prescription;Long Term: Able to explain home exercise prescription to exercise independently Short Term: Able to explain program exercise prescription;Long Term: Able to explain home exercise prescription to exercise independently Short Term: Able to explain program exercise prescription;Long Term: Able to explain home exercise prescription to exercise independently              Exercise Goals Re-Evaluation :  Exercise Goals Re-Evaluation     Row Name 10/16/20 1610 11/13/20 0922           Exercise Goal Re-Evaluation   Exercise Goals Review Increase Physical Activity;Increase Strength and Stamina;Able to understand and use rate of perceived exertion (RPE) scale;Knowledge and understanding of Target Heart Rate Range (THRR);Able to check pulse independently;Understanding of Exercise Prescription Increase Physical Activity;Increase Strength and Stamina;Able to understand and use rate of perceived exertion (RPE) scale;Knowledge and understanding of Target Heart Rate Range (THRR);Able to check pulse independently;Understanding of Exercise Prescription      Comments Pt has attended 7 sessions of cardiac rehab. He has been progressing well and has been increasing his workloads. During one of his first sessions, we took him to the ED due to chest tightness, lightheadedness, SOB, and ventricular trigeminy. He left AMA. He has reported no problems since that day. He states that he thinks that he was pushing himself too hard on that day. He is currently exercising at 3.52 METs on the stepper. Will continue to monitor and progress as able. Pt has attended 16 sessions of cardiac rehab. We recently had to decrease his incline on the TM due to having a high heart rate and the inablility for his HR to recover to within 10 beats of his resting during cool down. He has begun working third shift again and comes to rehab right when he gets off of work. He has not yet adapted to the new schedule  and he is noticably tired when he comes to rehab. He is currently exercising at 2.22 METs on the stepper. Will continue to monitor and progress as able.      Expected Outcomes Through exercise at rehab and at home, the patient will meet their stated goals. Through exercise at rehab and at home, the patient will meet their stated goals.                Discharge Exercise Prescription (Final Exercise Prescription Changes):  Exercise Prescription Changes - 11/12/20 0815       Response to Exercise   Blood Pressure (Admit) 128/70    Blood Pressure (Exercise) 128/60    Blood Pressure (Exit) 102/60    Heart Rate (Admit) 73 bpm    Heart Rate (Exercise) 102 bpm    Heart Rate (Exit) 84 bpm    Rating of Perceived Exertion (Exercise) 12    Duration Continue with 30 min of aerobic exercise without signs/symptoms of physical distress.    Intensity THRR unchanged      Progression   Progression Continue to progress workloads to maintain intensity without signs/symptoms of physical distress.      Resistance Training   Training Prescription Yes    Weight 4 lbs    Reps 10-15    Time 10 Minutes  Treadmill   MPH 2.4    Grade 2    Minutes 17    METs 3.5      NuStep   Level 3    SPM 96    Minutes 22    METs 2.22             Nutrition:  Target Goals: Understanding of nutrition guidelines, daily intake of sodium 1500mg , cholesterol 200mg , calories 30% from fat and 7% or less from saturated fats, daily to have 5 or more servings of fruits and vegetables.  Biometrics:  Pre Biometrics - 10/01/20 1432       Pre Biometrics   Height 5\' 11"  (1.803 m)    Weight 72.9 kg    Waist Circumference 33 inches    Hip Circumference 38.5 inches    Waist to Hip Ratio 0.86 %    BMI (Calculated) 22.43    Triceps Skinfold 11 mm    % Body Fat 20.5 %    Grip Strength 42.9 kg    Flexibility 0 in    Single Leg Stand 53 seconds              Nutrition Therapy Plan and Nutrition Goals:   Nutrition Therapy & Goals - 10/08/20 0955       Personal Nutrition Goals   Comments Patient scored 30 on his diet assessment. We offer 2 eduacational sessions on heart healthy nutrition and assistance with RD referral if interested.      Intervention Plan   Intervention Nutrition handout(s) given to patient.             Nutrition Assessments:  Nutrition Assessments - 10/01/20 1258       MEDFICTS Scores   Pre Score 30            MEDIFICTS Score Key: ?70 Need to make dietary changes  40-70 Heart Healthy Diet ? 40 Therapeutic Level Cholesterol Diet   Picture Your Plate Scores: 12/01/20 Unhealthy dietary pattern with much room for improvement. 41-50 Dietary pattern unlikely to meet recommendations for good health and room for improvement. 51-60 More healthful dietary pattern, with some room for improvement.  >60 Healthy dietary pattern, although there may be some specific behaviors that could be improved.    Nutrition Goals Re-Evaluation:   Nutrition Goals Discharge (Final Nutrition Goals Re-Evaluation):   Psychosocial: Target Goals: Acknowledge presence or absence of significant depression and/or stress, maximize coping skills, provide positive support system. Participant is able to verbalize types and ability to use techniques and skills needed for reducing stress and depression.  Initial Review & Psychosocial Screening:  Initial Psych Review & Screening - 10/01/20 1256       Initial Review   Current issues with Current Stress Concerns    Source of Stress Concerns Financial    Comments Currently on short term disability but it will run out this month. This provides him with just enough money to pay his mortgage.      Family Dynamics   Good Support System? Yes    Comments His wife is his support system. He also has 5 grandchildren.      Barriers   Psychosocial barriers to participate in program The patient should benefit from training in stress management and  relaxation.      Screening Interventions   Interventions Encouraged to exercise    Expected Outcomes Short Term goal: Utilizing psychosocial counselor, staff and physician to assist with identification of specific Stressors or current issues interfering with healing  process. Setting desired goal for each stressor or current issue identified.;Long Term Goal: Stressors or current issues are controlled or eliminated.;Long Term goal: The participant improves quality of Life and PHQ9 Scores as seen by post scores and/or verbalization of changes             Quality of Life Scores:  Quality of Life - 10/01/20 1445       Quality of Life   Select Quality of Life      Quality of Life Scores   Health/Function Pre 9.75 %    Socioeconomic Pre 15.64 %    Psych/Spiritual Pre 24.29 %    Family Pre 9.75 %    GLOBAL Pre 14.52 %            Scores of 19 and below usually indicate a poorer quality of life in these areas.  A difference of  2-3 points is a clinically meaningful difference.  A difference of 2-3 points in the total score of the Quality of Life Index has been associated with significant improvement in overall quality of life, self-image, physical symptoms, and general health in studies assessing change in quality of life.  PHQ-9: Recent Review Flowsheet Data     Depression screen [facility] 2/9 10/01/2020   Decreased Interest 0   Down, Depressed, Hopeless 0   PHQ - 2 Score 0   Altered sleeping 1   Tired, decreased energy 1   Change in appetite 0   Feeling bad or failure about yourself  0   Trouble concentrating 0   Moving slowly or fidgety/restless 0   Suicidal thoughts 0   PHQ-9 Score 2   Difficult doing work/chores Not difficult at all      Interpretation of Total Score  Total Score Depression Severity:  1-4 = Minimal depression, 5-9 = Mild depression, 10-14 = Moderate depression, 15-19 = Moderately severe depression, 20-27 = Severe depression   Psychosocial Evaluation and  Intervention:  Psychosocial Evaluation - 10/01/20 1436       Psychosocial Evaluation & Interventions   Interventions Stress management education;Relaxation education;Encouraged to exercise with the program and follow exercise prescription    Comments Pt has barriers that could effect his rehab which include finances and transportation. He was previously unable to do rehab do to his coinsurance. He would have been unable to pay this due to finances. He is currently on short term disability through [facility]. He reports that he has just enough money to pay his mortgage, but he is stressed because the disability runs out this month. Nicolette [employer] will not let him return to work until his life vest is removed. He has an appointment with Dr. Shirlee Latch on 8/31, and he is hopeful that the life vest will be removed then. He was interested in vocational rehab. I called the [facility] office, and they filled out a referral form for him. They stated that they would be calling him today. He is currently smoking about 1/2 pack of cigarettes per day. He has been on bupropion for about two months and reports that it is not helping. I have given the patient a quit smoking guide by the [employer]. He reports that he is interested in stopping. He scored a 2 on his PHQ-9. He reports that he has a good support system from his wife. His goals while in the program are to decrease his SOB with activity. He is eager to start the program.  Expected Outcomes The patient will successfully stop smoking and stressors will be lessened or eliminated    Continue Psychosocial Services  No Follow up required             Psychosocial Re-Evaluation:  Psychosocial Re-Evaluation     Row Name 10/08/20 0956 11/07/20 1008           Psychosocial Re-Evaluation   Current issues with Current Stress Concerns None Identified      Comments Patient is new to the program. He has completed 2  sessions. He continues to have barriers of finances and transportation. He is using Wellbutrin for smoking cessation but continues to smoke but has cut back. He feels he is able to manage his current stress concerns. He does demonstrate an interest to improve his health to enable him to return to work to help improve his financial situation. We will continue to monitor his progress. Patient has completed 14 sessions and has no psychosocial barriers identified. He continues wellbutrin for smoking cessation and is smoking 5 cigerattes/day. He has returned to work which has reduced his stress regaring his finances. He continues to demonstrate an interest in improving his health and a positive attitude. We will continue to montior for progress.      Expected Outcomes Patient's stress concerns will be managed. Patient will continue to have no psychosocial barriers identified.      Interventions -- Encouraged to attend Cardiac Rehabilitation for the exercise;Stress management education;Relaxation education      Continue Psychosocial Services  -- No Follow up required             Initial Review   Source of Stress Concerns Financial;Transportation --      Comments Currently on short term disability but it will run out this month. This provides him with just enough money to pay his mortgage. --               Psychosocial Discharge (Final Psychosocial Re-Evaluation):  Psychosocial Re-Evaluation - 11/07/20 1008       Psychosocial Re-Evaluation   Current issues with None Identified    Comments Patient has completed 14 sessions and has no psychosocial barriers identified. He continues wellbutrin for smoking cessation and is smoking 5 cigerattes/day. He has returned to work which has reduced his stress regaring his finances. He continues to demonstrate an interest in improving his health and a positive attitude. We will continue to montior for progress.    Expected Outcomes Patient will continue to have no  psychosocial barriers identified.    Interventions Encouraged to attend Cardiac Rehabilitation for the exercise;Stress management education;Relaxation education    Continue Psychosocial Services  No Follow up required             Vocational Rehabilitation: Provide vocational rehab assistance to qualifying candidates.   Vocational Rehab Evaluation & Intervention:  Vocational Rehab - 10/01/20 1446       Initial Vocational Rehab Evaluation & Intervention   Assessment shows need for Vocational Rehabilitation Yes    Documents faxed to [facility] 10/01/20   referral form filled out via phone            Education: Education Goals: Education classes will be provided on a weekly basis, covering required topics. Participant will state understanding/return demonstration of topics presented.  Learning Barriers/Preferences:  Learning Barriers/Preferences - 10/01/20 1300       Learning Barriers/Preferences   Learning Barriers Reading    Learning Preferences Individual Instruction;Skilled Demonstration  Education Topics: Hypertension, Hypertension Reduction -Define heart disease and high blood pressure. Discus how high blood pressure affects the body and ways to reduce high blood pressure.   Exercise and Your Heart -Discuss why it is important to exercise, the FITT principles of exercise, normal and abnormal responses to exercise, and how to exercise safely.   Angina -Discuss definition of angina, causes of angina, treatment of angina, and how to decrease risk of having angina.   Cardiac Medications -Review what the following cardiac medications are used for, how they affect the body, and side effects that may occur when taking the medications.  Medications include Aspirin, Beta blockers, calcium channel blockers, ACE Inhibitors, angiotensin receptor blockers, diuretics, digoxin, and antihyperlipidemics.   Congestive Heart  Failure -Discuss the definition of CHF, how to live with CHF, the signs and symptoms of CHF, and how keep track of weight and sodium intake. Flowsheet Row CARDIAC REHAB PHASE II EXERCISE from 11/07/2020 in [place] [facility]  Date 10/03/20  Educator DF  Instruction Review Code 2- Demonstrated Understanding       Heart Disease and Intimacy -Discus the effect sexual activity has on the heart, how changes occur during intimacy as we age, and safety during sexual activity.   Smoking Cessation / COPD -Discuss different methods to quit smoking, the health benefits of quitting smoking, and the definition of COPD. Flowsheet Row CARDIAC REHAB PHASE II EXERCISE from 11/07/2020 in [place] [facility]  Date 10/17/20  Educator DJ  Instruction Review Code 1- Verbalizes Understanding       Nutrition I: Fats -Discuss the types of cholesterol, what cholesterol does to the heart, and how cholesterol levels can be controlled.   Nutrition II: Labels -Discuss the different components of food labels and how to read food label Flowsheet Row CARDIAC REHAB PHASE II EXERCISE from 11/07/2020 in [facility]  Date 11/07/20  Educator DF  Instruction Review Code 2- Demonstrated Understanding       Heart Parts/Heart Disease and PAD -Discuss the anatomy of the heart, the pathway of blood circulation through the heart, and these are affected by heart disease.   Stress I: Signs and Symptoms -Discuss the causes of stress, how stress may lead to anxiety and depression, and ways to limit stress.   Stress II: Relaxation -Discuss different types of relaxation techniques to limit stress.   Warning Signs of Stroke / TIA -Discuss definition of a stroke, what the signs and symptoms are of a stroke, and how to identify when someone is having stroke.   Knowledge Questionnaire Score:  Knowledge Questionnaire Score - 10/01/20 1301       Knowledge Questionnaire  Score   Pre Score 24/28             Core Components/Risk Factors/Patient Goals at Admission:  Personal Goals and Risk Factors at Admission - 10/01/20 1305       Core Components/Risk Factors/Patient Goals on Admission    Weight Management Yes;Weight Maintenance    Intervention Weight Management: Develop a combined nutrition and exercise program designed to reach desired caloric intake, while maintaining appropriate intake of nutrient and fiber, sodium and fats, and appropriate energy expenditure required for the weight goal.;Weight Management: Provide education and appropriate resources to help participant work on and attain dietary goals.    Expected Outcomes Short Term: Continue to assess and modify interventions until short term weight is achieved;Long Term: Adherence to nutrition and physical activity/exercise program aimed toward attainment of established weight goal;Weight  Maintenance: Understanding of the daily nutrition guidelines, which includes 25-35% calories from fat, 7% or less cal from saturated fats, less than 200mg  cholesterol, less than 1.5gm of sodium, & 5 or more servings of fruits and vegetables daily    Tobacco Cessation Yes    Number of packs per day 1/2    Intervention Assist the participant in steps to quit. Provide individualized education and counseling about committing to Tobacco Cessation, relapse prevention, and pharmacological support that can be provided by physician.;Education officer, environmental, assist with locating and accessing local/national Quit Smoking programs, and support quit date choice.    Expected Outcomes Short Term: Will demonstrate readiness to quit, by selecting a quit date.;Short Term: Will quit all tobacco product use, adhering to prevention of relapse plan.;Long Term: Complete abstinence from all tobacco products for at least 12 months from quit date.    Improve shortness of breath with ADL's Yes    Intervention Provide education,  individualized exercise plan and daily activity instruction to help decrease symptoms of SOB with activities of daily living.    Expected Outcomes Short Term: Improve cardiorespiratory fitness to achieve a reduction of symptoms when performing ADLs;Long Term: Be able to perform more ADLs without symptoms or delay the onset of symptoms    Stress Yes    Intervention Offer individual and/or small group education and counseling on adjustment to heart disease, stress management and health-related lifestyle change. Teach and support self-help strategies.;Refer participants experiencing significant psychosocial distress to appropriate mental health specialists for further evaluation and treatment. When possible, include family members and significant others in education/counseling sessions.    Expected Outcomes Short Term: Participant demonstrates changes in health-related behavior, relaxation and other stress management skills, ability to obtain effective social support, and compliance with psychotropic medications if prescribed.;Long Term: Emotional wellbeing is indicated by absence of clinically significant psychosocial distress or social isolation.             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 He has completed 2 sessions maintaining his weight since his initial visit. Patient was put on a life vest since 2/22 due to EF 30-35%. He continues to wear with hopes that his EF will return to normal. Last ECHO was improved with 40-45% EF 7/12. He is using Wellburtin for smoking cessation and is currently smoking 1/2 pack/day. His personal goals for the program are to improve  his SOB with activity; quit smoking; get life vest off so he can return to work; get stronger. We will continue to  monitor his progress as he works towards meetings these goals. Patient has completed 14 sessions gaining 2 lbs since his initial visit. He is doing well in the program with consistent attendance and progression. He saw Dr. Shirlee Latch in the CHF clinis 8/31. With his improved EF for 40% now, he discontinued digoxin and losartan and added Entresto BID and discontinued his life vest enabling patient to return to work which he did last week. He was encouraged to follow up with an ENT due to abnormal PFT results which he is going to do in [place]. He was also referred to cancer screening dur to smoking. He has a chest  x-ray which showed a lung nodule which he is to follow up with CT in 3 months. His personal goals for the program continue to be to decrease his SOB with activity; get stronger; get life vest off and return to work. He has meet these goals particially and we will continue to montior his progress as he works towards meeting all goals.      Expected Outcomes Patient will complete the program meeting both personal and program goals. Patient will complete the program meeting both personal and program goals.               Core Components/Risk Factors/Patient Goals at Discharge (Final Review):   Goals and Risk Factor Review - 11/07/20 1010       Core Components/Risk Factors/Patient Goals Review   Personal Goals Review Tobacco Cessation;Weight Management/Obesity;Stress    Review Patient has completed 14 sessions gaining 2 lbs since his initial visit. He is doing well in the program with consistent attendance and progression. He saw Dr. Shirlee Latch in the CHF clinis 8/31. With his improved EF for 40% now, he discontinued digoxin and losartan and added Entresto BID and discontinued his life vest enabling patient to return to work which he did last week. He was encouraged to follow up with an ENT  due to abnormal PFT results which he is going to do in [place]. He was also referred to cancer screening dur to smoking. He has a chest x-ray which showed a lung nodule which he is to follow up with CT in 3 months. His personal goals for the program continue to be to decrease his SOB with activity; get stronger; get life vest off and return to work. He has meet these goals particially and we will continue to montior his progress as he works towards meeting all goals.    Expected Outcomes Patient will complete the program meeting both personal and program goals.             ITP Comments:   Comments: ITP REVIEW Pt is making expected progress toward Cardiac Rehab goals after completing 16 sessions. Recommend continued exercise, life style modification, education, and increased stamina and strength.

## 2020-11-14 NOTE — Progress Notes (Signed)
Daily Session Note  Patient Details  Name: [name] MRN: [id number] Date of Birth: 04/19/1965 Referring Provider:   Flowsheet Row CARDIAC REHAB PHASE II ORIENTATION from 10/01/2020 in [place] [facility]  Referring Provider Dr. Kelly       Encounter Date: 11/14/2020  Check In:  Session Check In - 11/14/20 0815       Check-In   Supervising physician immediately available to respond to emergencies [facility] MD immediately available    Physician(s) Dr. McDowell    Location [facility]    Staff Present Dalton Fletcher, MS, ACSM-CEP, Exercise Physiologist;Debra Johnson, RN, BSN;Other    Virtual Visit No    Medication changes reported     No    Fall or balance concerns reported    No    Tobacco Cessation No Change    Warm-up and Cool-down Performed as group-led instruction    Resistance Training Performed Yes    VAD Patient? No    PAD/SET Patient? No      Pain Assessment   Currently in Pain? No/denies    Pain Score 0-No pain    Multiple Pain Sites No             Capillary Blood Glucose: No results found for this or any previous visit (from the past 24 hour(s)).    Social History   Tobacco Use  Smoking Status Light Smoker   Packs/day: 0.50   Years: 51.00   Pack years: 25.50   Types: Cigarettes  Smokeless Tobacco Never  Tobacco Comments   Currently 6-8 cigs per day    Goals Met:  Independence with exercise equipment Exercise tolerated well No report of concerns or symptoms today Strength training completed today  Goals Unmet:  Not Applicable  Comments: checkout time is 0915   Dr. Jehanzeb Memon is Medical Director for [facility]. 

## 2020-11-16 ENCOUNTER — Encounter (HOSPITAL_COMMUNITY)
Admission: RE | Admit: 2020-11-16 | Discharge: 2020-11-16 | Disposition: A | Discharge status: Home / Self Care | Payer: BC Managed Care – PPO | Source: Ambulatory Visit | Attending: Cardiovascular Disease | Admitting: Cardiovascular Disease

## 2020-11-16 ENCOUNTER — Other Ambulatory Visit (HOSPITAL_COMMUNITY): Payer: Self-pay | Admitting: Cardiology

## 2020-11-16 ENCOUNTER — Other Ambulatory Visit: Payer: Self-pay

## 2020-11-16 DIAGNOSIS — I2102 ST elevation (STEMI) myocardial infarction involving left anterior descending coronary artery: Secondary | ICD-10-CM | POA: Diagnosis not present

## 2020-11-16 DIAGNOSIS — Z955 Presence of coronary angioplasty implant and graft: Secondary | ICD-10-CM | POA: Diagnosis not present

## 2020-11-16 NOTE — Progress Notes (Signed)
Daily Session Note  Patient Details  Name: [name] MRN: [id number] Date of Birth: 16-Dec-1965 Referring Provider:   Flowsheet Row CARDIAC REHAB PHASE II ORIENTATION from 10/01/2020 in [place]  Referring Provider Dr. Claiborne Billings       Encounter Date: 11/16/2020  Check In:  Session Check In - 11/16/20 0815       Check-In   Supervising physician immediately available to respond to emergencies [facility] MD immediately available    Physician(s) Dr. Harrington Challenger    Location [facility]    Staff Present Hoy Register, MS, ACSM-CEP, Exercise Physiologist;Other    Virtual Visit No    Medication changes reported     No    Fall or balance concerns reported    No    Tobacco Cessation No Change    Warm-up and Cool-down Performed as group-led instruction    Resistance Training Performed Yes    VAD Patient? No    PAD/SET Patient? No      Pain Assessment   Currently in Pain? No/denies    Pain Score 0-No pain    Multiple Pain Sites No             Capillary Blood Glucose: No results found for this or any previous visit (from the past 24 hour(s)).    Social History   Tobacco Use  Smoking Status Light Smoker   Packs/day: 0.50   Years: 51.00   Pack years: 25.50   Types: Cigarettes  Smokeless Tobacco Never  Tobacco Comments   Currently 6-8 cigs per day    Goals Met:  Independence with exercise equipment Exercise tolerated well No report of concerns or symptoms today Strength training completed today  Goals Unmet:  Not Applicable  Comments: check out 0915   Dr. Kathie Dike is Medical Director for [facility].

## 2020-11-19 ENCOUNTER — Other Ambulatory Visit: Payer: Self-pay

## 2020-11-19 ENCOUNTER — Encounter (HOSPITAL_COMMUNITY)
Admission: RE | Admit: 2020-11-19 | Discharge: 2020-11-19 | Disposition: A | Discharge status: Home / Self Care | Payer: BC Managed Care – PPO | Source: Ambulatory Visit | Attending: Cardiovascular Disease | Admitting: Cardiovascular Disease

## 2020-11-19 DIAGNOSIS — Z955 Presence of coronary angioplasty implant and graft: Secondary | ICD-10-CM

## 2020-11-19 DIAGNOSIS — I2102 ST elevation (STEMI) myocardial infarction involving left anterior descending coronary artery: Secondary | ICD-10-CM | POA: Diagnosis not present

## 2020-11-19 NOTE — Progress Notes (Signed)
Daily Session Note  Patient Details  Name: [name] MRN: [id number] Date of Birth: 04/10/1965 Referring Provider:   Flowsheet Row CARDIAC REHAB PHASE II ORIENTATION from 10/01/2020 in [place]  Referring Provider Dr. Claiborne Billings       Encounter Date: 11/19/2020  Check In:  Session Check In - 11/19/20 0815       Check-In   Supervising physician immediately available to respond to emergencies [facility] MD immediately available    Physician(s) Dr. Harl Bowie    Location [facility]    Staff Present [name], BS, Exercise Physiologist;Debra Wynetta Emery, RN, Bjorn Loser, MS, ACSM-CEP, Exercise Physiologist    Virtual Visit No    Medication changes reported     No    Fall or balance concerns reported    No    Tobacco Cessation No Change    Warm-up and Cool-down Performed as group-led instruction    Resistance Training Performed Yes    VAD Patient? No    PAD/SET Patient? No      Pain Assessment   Currently in Pain? No/denies    Pain Score 0-No pain    Multiple Pain Sites No             Capillary Blood Glucose: No results found for this or any previous visit (from the past 24 hour(s)).    Social History   Tobacco Use  Smoking Status Light Smoker   Packs/day: 0.50   Years: 51.00   Pack years: 25.50   Types: Cigarettes  Smokeless Tobacco Never  Tobacco Comments   Currently 6-8 cigs per day    Goals Met:  Independence with exercise equipment Exercise tolerated well No report of concerns or symptoms today Strength training completed today  Goals Unmet:  Not Applicable  Comments: check out 0915   Dr. Kathie Dike is Medical Director for [facility].

## 2020-11-19 NOTE — Progress Notes (Signed)
I have reviewed a Home Exercise Prescription with [name] . [name] is currently exercising at home.  The patient was advised to walk 2-4 days a week for 30-45 minutes.  [name] and I discussed how to progress their exercise prescription.  The patient stated that their goals were to get healthier.  The patient stated that they understand the exercise prescription.  We reviewed exercise guidelines, target heart rate during exercise, RPE Scale, weather conditions, NTG use, endpoints for exercise, warmup and cool down.  Patient is encouraged to come to me with any questions. I will continue to follow up with the patient to assist them with progression and safety.

## 2020-11-20 ENCOUNTER — Ambulatory Visit (INDEPENDENT_AMBULATORY_CARE_PROVIDER_SITE_OTHER): Payer: BC Managed Care – PPO | Admitting: Otolaryngology

## 2020-11-21 ENCOUNTER — Other Ambulatory Visit: Payer: Self-pay

## 2020-11-21 ENCOUNTER — Encounter (HOSPITAL_COMMUNITY)
Admission: RE | Admit: 2020-11-21 | Discharge: 2020-11-21 | Disposition: A | Discharge status: Home / Self Care | Payer: BC Managed Care – PPO | Source: Ambulatory Visit | Attending: Cardiovascular Disease | Admitting: Cardiovascular Disease

## 2020-11-21 DIAGNOSIS — Z955 Presence of coronary angioplasty implant and graft: Secondary | ICD-10-CM

## 2020-11-21 DIAGNOSIS — I2102 ST elevation (STEMI) myocardial infarction involving left anterior descending coronary artery: Secondary | ICD-10-CM

## 2020-11-21 NOTE — Progress Notes (Signed)
Daily Session Note  Patient Details  Name: [name] MRN: [id number] Date of Birth: [birth year] Referring Provider:   Flowsheet Row CARDIAC REHAB PHASE II ORIENTATION from 10/01/2020 in [place]  Referring Provider Dr. Claiborne Billings       Encounter Date: 11/21/2020  Check In:  Session Check In - 11/21/20 0828       Check-In   Supervising physician immediately available to respond to emergencies [facility] MD immediately available    Physician(s) Dr. Harl Bowie    Location [facility]    Staff Present [name], BS, Exercise Physiologist;Debra Wynetta Emery, RN, Bjorn Loser, MS, ACSM-CEP, Exercise Physiologist    Virtual Visit No    Medication changes reported     No    Fall or balance concerns reported    No    Tobacco Cessation No Change    Warm-up and Cool-down Performed as group-led instruction    Resistance Training Performed Yes    VAD Patient? No    PAD/SET Patient? No      Pain Assessment   Currently in Pain? No/denies    Pain Score 0-No pain    Multiple Pain Sites No             Capillary Blood Glucose: No results found for this or any previous visit (from the past 24 hour(s)).    Social History   Tobacco Use  Smoking Status Light Smoker   Packs/day: 0.50   Years: 51.00   Pack years: 25.50   Types: Cigarettes  Smokeless Tobacco Never  Tobacco Comments   Currently 6-8 cigs per day    Goals Met:  Independence with exercise equipment Exercise tolerated well No report of concerns or symptoms today Strength training completed today  Goals Unmet:  Not Applicable  Comments: check out 0915   Dr. Kathie Dike is Medical Director for [facility].

## 2020-11-23 ENCOUNTER — Other Ambulatory Visit: Payer: Self-pay

## 2020-11-23 ENCOUNTER — Encounter (HOSPITAL_COMMUNITY)
Admission: RE | Admit: 2020-11-23 | Discharge: 2020-11-23 | Disposition: A | Discharge status: Home / Self Care | Payer: BC Managed Care – PPO | Source: Ambulatory Visit | Attending: Cardiovascular Disease | Admitting: Cardiovascular Disease

## 2020-11-23 DIAGNOSIS — Z955 Presence of coronary angioplasty implant and graft: Secondary | ICD-10-CM

## 2020-11-23 DIAGNOSIS — I2102 ST elevation (STEMI) myocardial infarction involving left anterior descending coronary artery: Secondary | ICD-10-CM | POA: Diagnosis not present

## 2020-11-23 NOTE — Progress Notes (Signed)
Daily Session Note  Patient Details  Name: [name] MRN: [id number] Date of Birth: [birth year] Referring Provider:   Flowsheet Row CARDIAC REHAB PHASE II ORIENTATION from 10/01/2020 in [place]  Referring Provider Dr. Claiborne Billings       Encounter Date: 11/23/2020  Check In:  Session Check In - 11/23/20 0815       Check-In   Supervising physician immediately available to respond to emergencies [facility] MD immediately available    Physician(s) Dr. Harl Bowie    Location [facility]    Staff Present Hoy Register, MS, ACSM-CEP, Exercise Physiologist;Orbin Mayeux Zigmund Daniel, Exercise Physiologist;Debra Wynetta Emery, RN, BSN    Virtual Visit No    Medication changes reported     No    Fall or balance concerns reported    No    Tobacco Cessation No Change    Warm-up and Cool-down Performed as group-led instruction    Resistance Training Performed Yes    VAD Patient? No    PAD/SET Patient? No      Pain Assessment   Currently in Pain? No/denies    Pain Score 0-No pain    Multiple Pain Sites No             Capillary Blood Glucose: No results found for this or any previous visit (from the past 24 hour(s)).    Social History   Tobacco Use  Smoking Status Light Smoker   Packs/day: 0.50   Years: 51.00   Pack years: 25.50   Types: Cigarettes  Smokeless Tobacco Never  Tobacco Comments   Currently 6-8 cigs per day    Goals Met:  Independence with exercise equipment Exercise tolerated well No report of concerns or symptoms today Strength training completed today  Goals Unmet:  Not Applicable  Comments: check out 0915   Dr. Kathie Dike is Medical Director for [facility].

## 2020-11-26 ENCOUNTER — Encounter (HOSPITAL_COMMUNITY)
Admission: RE | Admit: 2020-11-26 | Discharge: 2020-11-26 | Disposition: A | Discharge status: Home / Self Care | Payer: BC Managed Care – PPO | Source: Ambulatory Visit | Attending: Cardiovascular Disease | Admitting: Cardiovascular Disease

## 2020-11-26 ENCOUNTER — Other Ambulatory Visit: Payer: Self-pay

## 2020-11-26 VITALS — Wt 161.8 lb

## 2020-11-26 DIAGNOSIS — I2102 ST elevation (STEMI) myocardial infarction involving left anterior descending coronary artery: Secondary | ICD-10-CM

## 2020-11-26 DIAGNOSIS — Z955 Presence of coronary angioplasty implant and graft: Secondary | ICD-10-CM

## 2020-11-26 NOTE — Progress Notes (Signed)
Daily Session Note  Patient Details  Name: [name] MRN: [id number] Date of Birth: [birth year] Referring Provider:   Flowsheet Row CARDIAC REHAB PHASE II ORIENTATION from 10/01/2020 in [place]  Referring Provider Dr. Claiborne Billings       Encounter Date: 11/26/2020  Check In:  Session Check In - 11/26/20 0815       Check-In   Supervising physician immediately available to respond to emergencies [facility] MD immediately available    Physician(s) Dr. Johnsie Cancel    Location [facility]    Staff Present Hoy Register, MS, ACSM-CEP, Exercise Physiologist;Charrise Lardner Zigmund Daniel, Exercise Physiologist;Carlette Wilber Oliphant, RN, BSN    Virtual Visit No    Medication changes reported     No    Fall or balance concerns reported    No    Tobacco Cessation No Change    Warm-up and Cool-down Performed as group-led instruction    Resistance Training Performed Yes    VAD Patient? No    PAD/SET Patient? No      Pain Assessment   Currently in Pain? No/denies    Pain Score 0-No pain    Multiple Pain Sites No             Capillary Blood Glucose: No results found for this or any previous visit (from the past 24 hour(s)).    Social History   Tobacco Use  Smoking Status Light Smoker   Packs/day: 0.50   Years: 51.00   Pack years: 25.50   Types: Cigarettes  Smokeless Tobacco Never  Tobacco Comments   Currently 6-8 cigs per day    Goals Met:  Independence with exercise equipment Exercise tolerated well No report of concerns or symptoms today Strength training completed today  Goals Unmet:  Not Applicable  Comments: check out 0915   Dr. Kathie Dike is Medical Director for [facility].

## 2020-11-28 ENCOUNTER — Encounter (HOSPITAL_COMMUNITY): Payer: BC Managed Care – PPO

## 2020-11-30 ENCOUNTER — Other Ambulatory Visit: Payer: Self-pay

## 2020-11-30 ENCOUNTER — Encounter (HOSPITAL_COMMUNITY)
Admission: RE | Admit: 2020-11-30 | Discharge: 2020-11-30 | Disposition: A | Discharge status: Home / Self Care | Payer: BC Managed Care – PPO | Source: Ambulatory Visit | Attending: Cardiovascular Disease | Admitting: Cardiovascular Disease

## 2020-11-30 DIAGNOSIS — I2102 ST elevation (STEMI) myocardial infarction involving left anterior descending coronary artery: Secondary | ICD-10-CM | POA: Diagnosis not present

## 2020-11-30 DIAGNOSIS — Z955 Presence of coronary angioplasty implant and graft: Secondary | ICD-10-CM | POA: Diagnosis not present

## 2020-11-30 NOTE — Progress Notes (Signed)
Daily Session Note  Patient Details  Name: [name] MRN: [id number] Date of Birth: October 20, 1965 Referring Provider:   Flowsheet Row CARDIAC REHAB PHASE II ORIENTATION from 10/01/2020 in [place]  Referring Provider Dr. Claiborne Billings       Encounter Date: 11/30/2020  Check In:  Session Check In - 11/30/20 0815       Check-In   Supervising physician immediately available to respond to emergencies [facility] MD immediately available    Physician(s) Dr. Johnsie Cancel    Location [facility]    Staff Present Hoy Register, MS, ACSM-CEP, Exercise Physiologist;Debra Wynetta Emery, RN, BSN;Heather Otho Ket, BS, Exercise Physiologist    Virtual Visit No    Medication changes reported     No    Fall or balance concerns reported    No    Tobacco Cessation No Change    Warm-up and Cool-down Performed as group-led instruction    Resistance Training Performed Yes    VAD Patient? No    PAD/SET Patient? No      Pain Assessment   Currently in Pain? No/denies    Pain Score 0-No pain    Multiple Pain Sites No             Capillary Blood Glucose: No results found for this or any previous visit (from the past 24 hour(s)).    Social History   Tobacco Use  Smoking Status Light Smoker   Packs/day: 0.50   Years: 51.00   Pack years: 25.50   Types: Cigarettes  Smokeless Tobacco Never  Tobacco Comments   Currently 6-8 cigs per day    Goals Met:  Independence with exercise equipment Exercise tolerated well No report of concerns or symptoms today Strength training completed today  Goals Unmet:  Not Applicable  Comments: checkout time is 0915   Dr. Kathie Dike is Medical Director for [facility].

## 2020-12-03 ENCOUNTER — Other Ambulatory Visit: Payer: Self-pay

## 2020-12-03 ENCOUNTER — Encounter (HOSPITAL_COMMUNITY)
Admission: RE | Admit: 2020-12-03 | Discharge: 2020-12-03 | Disposition: A | Discharge status: Home / Self Care | Payer: BC Managed Care – PPO | Source: Ambulatory Visit | Attending: Cardiovascular Disease | Admitting: Cardiovascular Disease

## 2020-12-03 DIAGNOSIS — I2102 ST elevation (STEMI) myocardial infarction involving left anterior descending coronary artery: Secondary | ICD-10-CM

## 2020-12-03 DIAGNOSIS — Z955 Presence of coronary angioplasty implant and graft: Secondary | ICD-10-CM | POA: Diagnosis not present

## 2020-12-03 NOTE — Progress Notes (Signed)
Daily Session Note  Patient Details  Name: [name] MRN: [id number] Date of Birth: [birth year] Referring Provider:   Flowsheet Row CARDIAC REHAB PHASE II ORIENTATION from 10/01/2020 in [place]  Referring Provider Dr. Claiborne Billings       Encounter Date: 12/03/2020  Check In:  Session Check In - 12/03/20 0815       Check-In   Supervising physician immediately available to respond to emergencies [facility] MD immediately available    Physician(s) Dr. Domenic Polite    Location [facility]    Staff Present Hoy Register, MS, ACSM-CEP, Exercise Physiologist;Debra Wynetta Emery, RN, BSN    Virtual Visit No    Medication changes reported     No    Fall or balance concerns reported    No    Tobacco Cessation No Change    Warm-up and Cool-down Performed as group-led instruction    Resistance Training Performed Yes    VAD Patient? No    PAD/SET Patient? No      Pain Assessment   Currently in Pain? No/denies    Pain Score 0-No pain    Multiple Pain Sites No             Capillary Blood Glucose: No results found for this or any previous visit (from the past 24 hour(s)).    Social History   Tobacco Use  Smoking Status Light Smoker   Packs/day: 0.50   Years: 51.00   Pack years: 25.50   Types: Cigarettes  Smokeless Tobacco Never  Tobacco Comments   Currently 6-8 cigs per day    Goals Met:  Independence with exercise equipment Exercise tolerated well No report of concerns or symptoms today Strength training completed today  Goals Unmet:  Not Applicable  Comments: checkout time is 0915   Dr. Kathie Dike is Medical Director for [facility].

## 2020-12-05 ENCOUNTER — Encounter (HOSPITAL_COMMUNITY)
Admission: RE | Admit: 2020-12-05 | Discharge: 2020-12-05 | Disposition: A | Discharge status: Home / Self Care | Payer: BC Managed Care – PPO | Source: Ambulatory Visit | Attending: Cardiovascular Disease | Admitting: Cardiovascular Disease

## 2020-12-05 ENCOUNTER — Other Ambulatory Visit: Payer: Self-pay

## 2020-12-05 DIAGNOSIS — Z955 Presence of coronary angioplasty implant and graft: Secondary | ICD-10-CM | POA: Diagnosis not present

## 2020-12-05 DIAGNOSIS — I2102 ST elevation (STEMI) myocardial infarction involving left anterior descending coronary artery: Secondary | ICD-10-CM | POA: Diagnosis not present

## 2020-12-05 NOTE — Progress Notes (Signed)
Daily Session Note  Patient Details  Name: [name] MRN: [id number] Date of Birth: [birth year] Referring Provider:   Flowsheet Row CARDIAC REHAB PHASE II ORIENTATION from 10/01/2020 in [place]  Referring Provider Dr. Claiborne Billings       Encounter Date: 12/05/2020  Check In:  Session Check In - 12/05/20 0815       Check-In   Supervising physician immediately available to respond to emergencies [facility] MD immediately available    Physician(s) Dr. Domenic Polite    Location [facility]    Staff Present Hoy Register, MS, ACSM-CEP, Exercise Physiologist;Junell Cullifer Zigmund Daniel, Exercise Physiologist    Virtual Visit No    Medication changes reported     No    Fall or balance concerns reported    No    Tobacco Cessation No Change    Warm-up and Cool-down Performed as group-led instruction    Resistance Training Performed Yes    VAD Patient? No    PAD/SET Patient? No      Pain Assessment   Currently in Pain? No/denies    Pain Score 0-No pain    Multiple Pain Sites No             Capillary Blood Glucose: No results found for this or any previous visit (from the past 24 hour(s)).    Social History   Tobacco Use  Smoking Status Light Smoker   Packs/day: 0.50   Years: 51.00   Pack years: 25.50   Types: Cigarettes  Smokeless Tobacco Never  Tobacco Comments   Currently 6-8 cigs per day    Goals Met:  Independence with exercise equipment Exercise tolerated well No report of concerns or symptoms today Strength training completed today  Goals Unmet:  Not Applicable  Comments: check out 0915   Dr. Kathie Dike is Medical Director for [facility].

## 2020-12-07 ENCOUNTER — Encounter (HOSPITAL_COMMUNITY)
Admission: RE | Admit: 2020-12-07 | Discharge: 2020-12-07 | Disposition: A | Discharge status: Home / Self Care | Payer: BC Managed Care – PPO | Source: Ambulatory Visit | Attending: Cardiovascular Disease | Admitting: Cardiovascular Disease

## 2020-12-07 ENCOUNTER — Other Ambulatory Visit: Payer: Self-pay

## 2020-12-07 DIAGNOSIS — I2102 ST elevation (STEMI) myocardial infarction involving left anterior descending coronary artery: Secondary | ICD-10-CM

## 2020-12-07 DIAGNOSIS — Z955 Presence of coronary angioplasty implant and graft: Secondary | ICD-10-CM | POA: Diagnosis not present

## 2020-12-07 NOTE — Progress Notes (Signed)
Daily Session Note  Patient Details  Name: [name] MRN: [id number] Date of Birth: 04/24/1965 Referring Provider:   Flowsheet Row CARDIAC REHAB PHASE II ORIENTATION from 10/01/2020 in [place] [facility]  Referring Provider Dr. Kelly       Encounter Date: 12/07/2020  Check In:  Session Check In - 12/07/20 0800       Check-In   Supervising physician immediately available to respond to emergencies [facility] MD immediately available    Physician(s) Dr. Branch    Location [facility]    Staff Present Dalton Fletcher, MS, ACSM-CEP, Exercise Physiologist;Heather Jachimiak, BS, Exercise Physiologist;Debra Johnson, RN, BSN    Virtual Visit No    Medication changes reported     No    Fall or balance concerns reported    No    Tobacco Cessation No Change    Warm-up and Cool-down Performed as group-led instruction    Resistance Training Performed Yes    VAD Patient? No    PAD/SET Patient? No      Pain Assessment   Currently in Pain? No/denies    Pain Score 0-No pain    Multiple Pain Sites No             Capillary Blood Glucose: No results found for this or any previous visit (from the past 24 hour(s)).    Social History   Tobacco Use  Smoking Status Light Smoker   Packs/day: 0.50   Years: 51.00   Pack years: 25.50   Types: Cigarettes  Smokeless Tobacco Never  Tobacco Comments   Currently 6-8 cigs per day    Goals Met:  Independence with exercise equipment Exercise tolerated well No report of concerns or symptoms today Strength training completed today  Goals Unmet:  Not Applicable  Comments: check out 0915   Dr. Jehanzeb Memon is Medical Director for  Pulmonary Rehab. 

## 2020-12-10 ENCOUNTER — Other Ambulatory Visit: Payer: Self-pay

## 2020-12-10 ENCOUNTER — Encounter (HOSPITAL_COMMUNITY)
Admission: RE | Admit: 2020-12-10 | Discharge: 2020-12-10 | Disposition: A | Discharge status: Home / Self Care | Payer: BC Managed Care – PPO | Source: Ambulatory Visit | Attending: Cardiovascular Disease | Admitting: Cardiovascular Disease

## 2020-12-10 VITALS — Wt 159.0 lb

## 2020-12-10 DIAGNOSIS — Z955 Presence of coronary angioplasty implant and graft: Secondary | ICD-10-CM

## 2020-12-10 DIAGNOSIS — I2102 ST elevation (STEMI) myocardial infarction involving left anterior descending coronary artery: Secondary | ICD-10-CM

## 2020-12-10 NOTE — Progress Notes (Signed)
Daily Session Note  Patient Details  Name: [name] MRN: [id number] Date of Birth: 1965/04/24 Referring Provider:   Flowsheet Row CARDIAC REHAB PHASE II ORIENTATION from 10/01/2020 in [place]  Referring Provider Dr. Claiborne Billings       Encounter Date: 12/10/2020  Check In:  Session Check In - 12/10/20 0815       Check-In   Supervising physician immediately available to respond to emergencies [facility] MD immediately available    Physician(s) Dr. Gardiner Rhyme    Location [facility]    Staff Present Hoy Register, MS, ACSM-CEP, Exercise Physiologist;Debra Wynetta Emery, RN, BSN    Virtual Visit No    Medication changes reported     No    Fall or balance concerns reported    No    Tobacco Cessation No Change    Warm-up and Cool-down Performed as group-led instruction    Resistance Training Performed Yes    VAD Patient? No    PAD/SET Patient? No      Pain Assessment   Currently in Pain? No/denies    Pain Score 0-No pain    Multiple Pain Sites No             Capillary Blood Glucose: No results found for this or any previous visit (from the past 24 hour(s)).    Social History   Tobacco Use  Smoking Status Light Smoker   Packs/day: 0.50   Years: 51.00   Pack years: 25.50   Types: Cigarettes  Smokeless Tobacco Never  Tobacco Comments   Currently 6-8 cigs per day    Goals Met:  Independence with exercise equipment Exercise tolerated well No report of concerns or symptoms today Strength training completed today  Goals Unmet:  Not Applicable  Comments: checkout time is 0915   Dr. Kathie Dike is Medical Director for [facility].

## 2020-12-12 ENCOUNTER — Encounter (HOSPITAL_COMMUNITY): Payer: BC Managed Care – PPO

## 2020-12-12 NOTE — Progress Notes (Addendum)
Cardiac Individual Treatment Plan  Patient Details  Name: [name] MRN: [id number] Date of Birth: 1965-04-24 Referring Provider:   Flowsheet Row CARDIAC REHAB PHASE II ORIENTATION from 10/01/2020 in [facility] CARDIAC REHABILITATION  Referring Provider Dr. Tresa Endo       Initial Encounter Date:  Flowsheet Row CARDIAC REHAB PHASE II ORIENTATION from 10/01/2020 in [place] CARDIAC REHABILITATION  Date 10/01/20       Visit Diagnosis: ST elevation myocardial infarction involving left anterior descending (LAD) coronary artery [facility])  Status post coronary artery stent placement  Patient's Home Medications on Admission:  Current Outpatient Medications:    aspirin EC 81 MG tablet, Take 1 tablet (81 mg total) by mouth daily., Disp: 90 tablet, Rfl: 3   atorvastatin (LIPITOR) 80 MG tablet, Take 1 tablet (80 mg total) by mouth daily., Disp: 90 tablet, Rfl: 3   buPROPion (WELLBUTRIN SR) 150 MG 12 hr tablet, Take 1 tablet by mouth twice daily, Disp: 60 tablet, Rfl: 5   carvedilol (COREG) 3.125 MG tablet, Take 1 tablet (3.125 mg total) by mouth 2 (two) times daily with a meal., Disp: 180 tablet, Rfl: 3   clopidogrel (PLAVIX) 75 MG tablet, Take 1 tablet (75 mg total) by mouth daily., Disp: 90 tablet, Rfl: 3   dapagliflozin propanediol (FARXIGA) 10 MG TABS tablet, Take 1 tablet (10 mg total) by mouth daily., Disp: 90 tablet, Rfl: 3   nitroGLYCERIN (NITROSTAT) 0.4 MG SL tablet, Place 1 tablet (0.4 mg total) under the tongue every 5 (five) minutes as needed., Disp: 25 tablet, Rfl: 3   spironolactone (ALDACTONE) 25 MG tablet, Take 1 tablet (25 mg total) by mouth daily., Disp: 90 tablet, Rfl: 3  Past Medical History: Past Medical History:  Diagnosis Date   Coronary arteriosclerosis    a. PCI LAD 2009 in [place];  b. 12/2010 Cath: LM: nl, LAD: patent stent mid, 25 mid, LCX nl, RCA: non dominant, nl, EF 50%   History of methamphetamine abuse (HCC)    a. Used x 6 yrs, quit July 2012   History of  tobacco abuse    a. 41 yrs, up to 3 ppd, quit July 2012   Hypercholesterolemia     Tobacco Use: Social History   Tobacco Use  Smoking Status Light Smoker   Packs/day: 0.50   Years: 51.00   Pack years: 25.50   Types: Cigarettes  Smokeless Tobacco Never  Tobacco Comments   Currently 6-8 cigs per day    Labs: Recent Review Flowsheet Data     Labs for ITP Cardiac and Pulmonary Rehab Latest Ref Rng & Units 12/20/2010 04/22/2020 04/23/2020 04/24/2020 07/27/2020   Cholestrol 0 - 200 mg/dL 409 - 811 - 914   LDLCALC 0 - 99 mg/dL 64 - 89 - 45   HDL >78 mg/dL 29(F) - 62(Z) - 30(Q)   Trlycerides <150 mg/dL 657 - 79 - 846(N)   Hemoglobin A1c 4.8 - 5.6 % - - - 5.4 -   TCO2 22 - 32 mmol/L - 23 - - -       Capillary Blood Glucose: No results found for: GLUCAP   Exercise Target Goals: Exercise Program Goal: Individual exercise prescription set using results from initial 6 min walk test and THRR while considering  patient's activity barriers and safety.   Exercise Prescription Goal: Starting with aerobic activity 30 plus minutes a day, 3 days per week for initial exercise prescription. Provide home exercise prescription and guidelines that participant acknowledges understanding prior to discharge.  Activity Barriers & Risk Stratification:  Activity Barriers & Cardiac Risk Stratification - 10/01/20 1255       Activity Barriers & Cardiac Risk Stratification   Activity Barriers Shortness of Breath;Decreased Ventricular Function    Cardiac Risk Stratification High             6 Minute Walk:  6 Minute Walk     Row Name 10/01/20 1421         6 Minute Walk   Phase Initial     Distance 1300 feet     Walk Time 6 minutes     # of Rest Breaks 0     MPH 2.46     METS 3.62     RPE 11     VO2 Peak 12.65     Symptoms Yes (comment)     Comments Right hip pain 4/10     Resting HR 74 bpm     Resting BP 90/60     Resting Oxygen Saturation  98 %     Exercise Oxygen Saturation   during 6 min walk 99 %     Max Ex. HR 80 bpm     Max Ex. BP 98/70     2 Minute Post BP 86/58              Oxygen Initial Assessment:   Oxygen Re-Evaluation:   Oxygen Discharge (Final Oxygen Re-Evaluation):   Initial Exercise Prescription:  Initial Exercise Prescription - 10/01/20 1400       Date of Initial Exercise RX and Referring Provider   Date 10/01/20    Referring Provider Dr. Tresa Endo    Expected Discharge Date 12/24/20      Treadmill   MPH 1.5    Grade 0    Minutes 17      NuStep   Level 1    SPM 80    Minutes 22      Prescription Details   Frequency (times per week) 3    Duration Progress to 30 minutes of continuous aerobic without signs/symptoms of physical distress      Intensity   THRR 40-80% of Max Heartrate 65-130    Ratings of Perceived Exertion 11-13    Perceived Dyspnea 0-4      Resistance Training   Training Prescription Yes    Weight 4 lbs    Reps 10-15             Perform Capillary Blood Glucose checks as needed.  Exercise Prescription Changes:   Exercise Prescription Changes     Row Name 10/15/20 1100 10/29/20 0815 11/12/20 0815 11/19/20 0800 11/26/20 0900     Response to Exercise   Blood Pressure (Admit) 104/66 90/60 128/70 -- 118/66   Blood Pressure (Exercise) 98/70 100/60 128/60 -- 130/62   Blood Pressure (Exit) 98/62 88/48 102/60 -- 98/60   Heart Rate (Admit) 67 bpm 87 bpm 73 bpm -- 70 bpm   Heart Rate (Exercise) 107 bpm 103 bpm 102 bpm -- 104 bpm   Heart Rate (Exit) 76 bpm 96 bpm 84 bpm -- 85 bpm   Rating of Perceived Exertion (Exercise) -- 12   Duration Continue with 30 min of aerobic exercise without signs/symptoms of physical distress. Continue with 30 min of aerobic exercise without signs/symptoms of physical distress. Continue with 30 min of aerobic exercise without signs/symptoms of physical distress. -- Continue with 30 min of aerobic exercise without signs/symptoms of physical distress.   Intensity THRR  unchanged THRR unchanged THRR unchanged -- THRR unchanged     Progression   Progression Continue to progress workloads to maintain intensity without signs/symptoms of physical distress. Continue to progress workloads to maintain intensity without signs/symptoms of physical distress. Continue to progress workloads to maintain intensity without signs/symptoms of physical distress. -- --     Paramedic Prescription Yes Yes Yes -- Yes   Weight 4 lbs 4 lbs 4 lbs -- 4 lbs   Reps 10-15 10-15 10-15 -- 10-15   Time -- 10 Minutes 10 Minutes -- 10 Minutes     Treadmill   MPH 2.4 2.5 2.4 -- 2.7   Grade 4 0 2 -- 2.5   Minutes 17 17 17  -- 22   METs 4.16 2.91 3.5 -- 4     NuStep   Level 2 3 3  -- 3   SPM 119 122 96 -- 117   Minutes 22 22 22  -- 17   METs 3.52 3.69 2.22 -- 1.86     Home Exercise Plan   Plans to continue exercise at -- -- -- Home (comment) --   Frequency -- -- -- Add 2 additional days to program exercise sessions. --   Initial Home Exercises Provided -- -- -- 11/19/20 --    Row Name 12/10/20 1200             Response to Exercise   Blood Pressure (Admit) 106/58       Blood Pressure (Exercise) 140/60       Blood Pressure (Exit) 96/52       Heart Rate (Admit) 65 bpm       Heart Rate (Exercise) 102 bpm       Heart Rate (Exit) 90 bpm       Rating of Perceived Exertion (Exercise) 12       Duration Continue with 30 min of aerobic exercise without signs/symptoms of physical distress.       Intensity THRR unchanged               Progression   Progression Continue to progress workloads to maintain intensity without signs/symptoms of physical distress.               Resistance Training   Training Prescription Yes       Weight 5       Reps 10-15       Time 10 Minutes               Treadmill   MPH 2.6       Grade 3.5       Minutes 22       METs 4.25               NuStep   Level 3       SPM 127       Minutes 17       METs 5.23                 Exercise Comments:   Exercise Comments     Row Name 11/19/20 0853           Exercise Comments home exercise reviewed                Exercise Goals and Review:   Exercise Goals     Row Name 10/01/20 1432 10/16/20 1610 11/13/20 0922 12/10/20 1232       Exercise Goals   Increase Physical Activity Yes Yes  Yes Yes    Intervention Provide advice, education, support and counseling about physical activity/exercise needs.;Develop an individualized exercise prescription for aerobic and resistive training based on initial evaluation findings, risk stratification, comorbidities and participant's personal goals. Provide advice, education, support and counseling about physical activity/exercise needs.;Develop an individualized exercise prescription for aerobic and resistive training based on initial evaluation findings, risk stratification, comorbidities and participant's personal goals. Provide advice, education, support and counseling about physical activity/exercise needs.;Develop an individualized exercise prescription for aerobic and resistive training based on initial evaluation findings, risk stratification, comorbidities and participant's personal goals. Provide advice, education, support and counseling about physical activity/exercise needs.;Develop an individualized exercise prescription for aerobic and resistive training based on initial evaluation findings, risk stratification, comorbidities and participant's personal goals.    Expected Outcomes Short Term: Attend rehab on a regular basis to increase amount of physical activity.;Long Term: Add in home exercise to make exercise part of routine and to increase amount of physical activity.;Long Term: Exercising regularly at least 3-5 days a week. Short Term: Attend rehab on a regular basis to increase amount of physical activity.;Long Term: Add in home exercise to make exercise part of routine and to increase amount of physical  activity.;Long Term: Exercising regularly at least 3-5 days a week. Short Term: Attend rehab on a regular basis to increase amount of physical activity.;Long Term: Add in home exercise to make exercise part of routine and to increase amount of physical activity.;Long Term: Exercising regularly at least 3-5 days a week. Short Term: Attend rehab on a regular basis to increase amount of physical activity.;Long Term: Add in home exercise to make exercise part of routine and to increase amount of physical activity.;Long Term: Exercising regularly at least 3-5 days a week.    Increase Strength and Stamina Yes Yes Yes Yes    Intervention Provide advice, education, support and counseling about physical activity/exercise needs.;Develop an individualized exercise prescription for aerobic and resistive training based on initial evaluation findings, risk stratification, comorbidities and participant's personal goals. Provide advice, education, support and counseling about physical activity/exercise needs.;Develop an individualized exercise prescription for aerobic and resistive training based on initial evaluation findings, risk stratification, comorbidities and participant's personal goals. Provide advice, education, support and counseling about physical activity/exercise needs.;Develop an individualized exercise prescription for aerobic and resistive training based on initial evaluation findings, risk stratification, comorbidities and participant's personal goals. Provide advice, education, support and counseling about physical activity/exercise needs.;Develop an individualized exercise prescription for aerobic and resistive training based on initial evaluation findings, risk stratification, comorbidities and participant's personal goals.    Expected Outcomes Short Term: Increase workloads from initial exercise prescription for resistance, speed, and METs.;Short Term: Perform resistance training exercises routinely during  rehab and add in resistance training at home;Long Term: Improve cardiorespiratory fitness, muscular endurance and strength as measured by increased METs and functional capacity ( ) Short Term: Increase workloads from initial exercise prescription for resistance, speed, and METs.;Short Term: Perform resistance training exercises routinely during rehab and add in resistance training at home;Long Term: Improve cardiorespiratory fitness, muscular endurance and strength as measured by increased METs and functional capacity ( ) Short Term: Increase workloads from initial exercise prescription for resistance, speed, and METs.;Short Term: Perform resistance training exercises routinely during rehab and add in resistance training at home;Long Term: Improve cardiorespiratory fitness, muscular endurance and strength as measured by increased METs and functional capacity ( ) Short Term: Increase workloads from initial exercise prescription for resistance, speed, and METs.;Short Term: Perform resistance training exercises routinely during  rehab and add in resistance training at home;Long Term: Improve cardiorespiratory fitness, muscular endurance and strength as measured by increased METs and functional capacity ( )    Able to understand and use rate of perceived exertion (RPE) scale Yes Yes Yes Yes    Intervention Provide education and explanation on how to use RPE scale Provide education and explanation on how to use RPE scale Provide education and explanation on how to use RPE scale Provide education and explanation on how to use RPE scale    Expected Outcomes Short Term: Able to use RPE daily in rehab to express subjective intensity level;Long Term:  Able to use RPE to guide intensity level when exercising independently Short Term: Able to use RPE daily in rehab to express subjective intensity level;Long Term:  Able to use RPE to guide intensity level when exercising independently Short Term: Able to use RPE  daily in rehab to express subjective intensity level;Long Term:  Able to use RPE to guide intensity level when exercising independently Short Term: Able to use RPE daily in rehab to express subjective intensity level;Long Term:  Able to use RPE to guide intensity level when exercising independently    Knowledge and understanding of Target Heart Rate Range (THRR) Yes Yes Yes Yes    Intervention Provide education and explanation of THRR including how the numbers were predicted and where they are located for reference Provide education and explanation of THRR including how the numbers were predicted and where they are located for reference Provide education and explanation of THRR including how the numbers were predicted and where they are located for reference Provide education and explanation of THRR including how the numbers were predicted and where they are located for reference    Expected Outcomes Short Term: Able to state/look up THRR;Long Term: Able to use THRR to govern intensity when exercising independently;Short Term: Able to use daily as guideline for intensity in rehab Short Term: Able to state/look up THRR;Long Term: Able to use THRR to govern intensity when exercising independently;Short Term: Able to use daily as guideline for intensity in rehab Short Term: Able to state/look up THRR;Long Term: Able to use THRR to govern intensity when exercising independently;Short Term: Able to use daily as guideline for intensity in rehab Short Term: Able to state/look up THRR;Long Term: Able to use THRR to govern intensity when exercising independently;Short Term: Able to use daily as guideline for intensity in rehab    Able to check pulse independently Yes Yes Yes Yes    Intervention Provide education and demonstration on how to check pulse in carotid and radial arteries.;Review the importance of being able to check your own pulse for safety during independent exercise Provide education and demonstration on  how to check pulse in carotid and radial arteries.;Review the importance of being able to check your own pulse for safety during independent exercise Provide education and demonstration on how to check pulse in carotid and radial arteries.;Review the importance of being able to check your own pulse for safety during independent exercise Provide education and demonstration on how to check pulse in carotid and radial arteries.;Review the importance of being able to check your own pulse for safety during independent exercise    Expected Outcomes Short Term: Able to explain why pulse checking is important during independent exercise;Long Term: Able to check pulse independently and accurately Short Term: Able to explain why pulse checking is important during independent exercise;Long Term: Able to check pulse independently and accurately Short Term:  Able to explain why pulse checking is important during independent exercise;Long Term: Able to check pulse independently and accurately Short Term: Able to explain why pulse checking is important during independent exercise;Long Term: Able to check pulse independently and accurately    Understanding of Exercise Prescription Yes Yes Yes Yes    Intervention Provide education, explanation, and written materials on patient's individual exercise prescription Provide education, explanation, and written materials on patient's individual exercise prescription Provide education, explanation, and written materials on patient's individual exercise prescription Provide education, explanation, and written materials on patient's individual exercise prescription    Expected Outcomes Short Term: Able to explain program exercise prescription;Long Term: Able to explain home exercise prescription to exercise independently Short Term: Able to explain program exercise prescription;Long Term: Able to explain home exercise prescription to exercise independently Short Term: Able to explain  program exercise prescription;Long Term: Able to explain home exercise prescription to exercise independently Short Term: Able to explain program exercise prescription;Long Term: Able to explain home exercise prescription to exercise independently             Exercise Goals Re-Evaluation :  Exercise Goals Re-Evaluation     Row Name 10/16/20 5364 11/13/20 0922 12/10/20 1233         Exercise Goal Re-Evaluation   Exercise Goals Review Increase Physical Activity;Increase Strength and Stamina;Able to understand and use rate of perceived exertion (RPE) scale;Knowledge and understanding of Target Heart Rate Range (THRR);Able to check pulse independently;Understanding of Exercise Prescription Increase Physical Activity;Increase Strength and Stamina;Able to understand and use rate of perceived exertion (RPE) scale;Knowledge and understanding of Target Heart Rate Range (THRR);Able to check pulse independently;Understanding of Exercise Prescription Increase Physical Activity;Increase Strength and Stamina;Able to understand and use rate of perceived exertion (RPE) scale;Knowledge and understanding of Target Heart Rate Range (THRR);Able to check pulse independently;Understanding of Exercise Prescription     Comments Pt has attended 7 sessions of cardiac rehab. He has been progressing well and has been increasing his workloads. During one of his first sessions, we took him to the ED due to chest tightness, lightheadedness, SOB, and ventricular trigeminy. He left AMA. He has reported no problems since that day. He states that he thinks that he was pushing himself too hard on that day. He is currently exercising at 3.52 METs on the stepper. Will continue to monitor and progress as able. Pt has attended 16 sessions of cardiac rehab. We recently had to decrease his incline on the TM due to having a high heart rate and the inablility for his HR to recover to within 10 beats of his resting during cool down. He has  begun working third shift again and comes to rehab right when he gets off of work. He has not yet adapted to the new schedule and he is noticably tired when he comes to rehab. He is currently exercising at 2.22 METs on the stepper. Will continue to monitor and progress as able. Pt has attended 27 sessions of cardiac rehab. He will graduate the program in 2 weeks. Has gone down to 2xs a week due to 3rd shift work being to much. He is often tired when arriving due to just getting off work and walking several miles while working. Current exercising at 5.23 METs while on the stepper. Will continue to monitor and progess as able.     Expected Outcomes Through exercise at rehab and at home, the patient will meet their stated goals. Through exercise at rehab and at home, the  patient will meet their stated goals. Through exercise at rehab and at home, the patient will meet their stated goals.               Discharge Exercise Prescription (Final Exercise Prescription Changes):  Exercise Prescription Changes - 12/10/20 1200       Response to Exercise   Blood Pressure (Admit) 106/58    Blood Pressure (Exercise) 140/60    Blood Pressure (Exit) 96/52    Heart Rate (Admit) 65 bpm    Heart Rate (Exercise) 102 bpm    Heart Rate (Exit) 90 bpm    Rating of Perceived Exertion (Exercise) 12    Duration Continue with 30 min of aerobic exercise without signs/symptoms of physical distress.    Intensity THRR unchanged      Progression   Progression Continue to progress workloads to maintain intensity without signs/symptoms of physical distress.      Resistance Training   Training Prescription Yes    Weight 5    Reps 10-15    Time 10 Minutes      Treadmill   MPH 2.6    Grade 3.5    Minutes 22    METs 4.25      NuStep   Level 3    SPM 127    Minutes 17    METs 5.23             Nutrition:  Target Goals: Understanding of nutrition guidelines, daily intake of sodium 1500mg , cholesterol  200mg , calories 30% from fat and 7% or less from saturated fats, daily to have 5 or more servings of fruits and vegetables.  Biometrics:  Pre Biometrics - 10/01/20 1432       Pre Biometrics   Height  (1.803 m)    Weight 72.9 kg    Waist Circumference 33 inches    Hip Circumference 38.5 inches    Waist to Hip Ratio 0.86 %    BMI (Calculated) 22.43    Triceps Skinfold 11 mm    % Body Fat 20.5 %    Grip Strength 42.9 kg    Flexibility 0 in    Single Leg Stand 53 seconds              Nutrition Therapy Plan and Nutrition Goals:  Nutrition Therapy & Goals - 10/08/20 0955       Personal Nutrition Goals   Comments Patient scored 30 on his diet assessment. We offer 2 eduacational sessions on heart healthy nutrition and assistance with RD referral if interested.      Intervention Plan   Intervention Nutrition handout(s) given to patient.             Nutrition Assessments:  Nutrition Assessments - 10/01/20 1258       MEDFICTS Scores   Pre Score 30            MEDIFICTS Score Key: ?70 Need to make dietary changes  40-70 Heart Healthy Diet ? 40 Therapeutic Level Cholesterol Diet   Picture Your Plate Scores: <16 Unhealthy dietary pattern with much room for improvement. 41-50 Dietary pattern unlikely to meet recommendations for good health and room for improvement. 51-60 More healthful dietary pattern, with some room for improvement.  >60 Healthy dietary pattern, although there may be some specific behaviors that could be improved.    Nutrition Goals Re-Evaluation:   Nutrition Goals Discharge (Final Nutrition Goals Re-Evaluation):   Psychosocial: Target Goals: Acknowledge presence or absence of significant depression and/or stress,  maximize coping skills, provide positive support system. Participant is able to verbalize types and ability to use techniques and skills needed for reducing stress and depression.  Initial Review & Psychosocial  Screening:  Initial Psych Review & Screening - 10/01/20 1256       Initial Review   Current issues with Current Stress Concerns    Source of Stress Concerns Financial    Comments Currently on short term disability but it will run out this month. This provides him with just enough money to pay his mortgage.      Family Dynamics   Good Support System? Yes    Comments His wife is his support system. He also has 5 grandchildren.      Barriers   Psychosocial barriers to participate in program The patient should benefit from training in stress management and relaxation.      Screening Interventions   Interventions Encouraged to exercise    Expected Outcomes Short Term goal: Utilizing psychosocial counselor, staff and physician to assist with identification of specific Stressors or current issues interfering with healing process. Setting desired goal for each stressor or current issue identified.;Long Term Goal: Stressors or current issues are controlled or eliminated.;Long Term goal: The participant improves quality of Life and PHQ9 Scores as seen by post scores and/or verbalization of changes             Quality of Life Scores:  Quality of Life - 10/01/20 1445       Quality of Life   Select Quality of Life      Quality of Life Scores   Health/Function Pre 9.75 %    Socioeconomic Pre 15.64 %    Psych/Spiritual Pre 24.29 %    Family Pre 9.75 %    GLOBAL Pre 14.52 %            Scores of 19 and below usually indicate a poorer quality of life in these areas.  A difference of  2-3 points is a clinically meaningful difference.  A difference of 2-3 points in the total score of the Quality of Life Index has been associated with significant improvement in overall quality of life, self-image, physical symptoms, and general health in studies assessing change in quality of life.  PHQ-9: Recent Review Flowsheet Data     Depression screen [facility] 2/9 10/01/2020   Decreased Interest 0    Down, Depressed, Hopeless 0   PHQ - 2 Score 0   Altered sleeping 1   Tired, decreased energy 1   Change in appetite 0   Feeling bad or failure about yourself  0   Trouble concentrating 0   Moving slowly or fidgety/restless 0   Suicidal thoughts 0   PHQ-9 Score 2   Difficult doing work/chores Not difficult at all      Interpretation of Total Score  Total Score Depression Severity:  1-4 = Minimal depression, 5-9 = Mild depression, 10-14 = Moderate depression, 15-19 = Moderately severe depression, 20-27 = Severe depression   Psychosocial Evaluation and Intervention:  Psychosocial Evaluation - 10/01/20 1436       Psychosocial Evaluation & Interventions   Interventions Stress management education;Relaxation education;Encouraged to exercise with the program and follow exercise prescription    Comments Pt has barriers that could effect his rehab which include finances and transportation. He was previously unable to do rehab do to his coinsurance. He would have been unable to pay this due to finances. He is currently on short term disability through  [employer]. He reports that he has just enough money to pay his mortgage, but he is stressed because the disability runs out this month. [employer] will not let him return to work until his life vest is removed. He has an appointment with Dr. Shirlee Latch on 8/31, and he is hopeful that the life vest will be removed then. He was interested in vocational rehab. I called the [facility] office, and they filled out a referral form for him. They stated that they would be calling him today. He is currently smoking about 1/2 pack of cigarettes per day. He has been on bupropion for about two months and reports that it is not helping. I have given the patient a quit smoking guide by the [employer]. He reports that he is interested in stopping. He scored a 2 on his PHQ-9. He reports that he has a good support system from his  wife. His goals while in the program are to decrease his SOB with activity. He is eager to start the program.    Expected Outcomes The patient will successfully stop smoking and stressors will be lessened or eliminated    Continue Psychosocial Services  No Follow up required             Psychosocial Re-Evaluation:  Psychosocial Re-Evaluation     Row Name 10/08/20 0956 11/07/20 1008 12/03/20 1024         Psychosocial Re-Evaluation   Current issues with Current Stress Concerns None Identified None Identified     Comments Patient is new to the program. He has completed 2 sessions. He continues to have barriers of finances and transportation. He is using Wellbutrin for smoking cessation but continues to smoke but has cut back. He feels he is able to manage his current stress concerns. He does demonstrate an interest to improve his health to enable him to return to work to help improve his financial situation. We will continue to monitor his progress. Patient has completed 14 sessions and has no psychosocial barriers identified. He continues wellbutrin for smoking cessation and is smoking 5 cigerattes/day. He has returned to work which has reduced his stress regaring his finances. He continues to demonstrate an interest in improving his health and a positive attitude. We will continue to montior for progress. Patient has completed 24 sessions and has no psychosocial barriers identified. He continues wellbutrin for smoking cessation and is smoking 5 cigerattes/day. He has continues to work without difficulty which has improved his stress. He continues to demonstrate an interest in improving his health and a positive attitude. We will continue to montior for progress.     Expected Outcomes Patient's stress concerns will be managed. Patient will continue to have no psychosocial barriers identified. Patient will continue to have no psychosocial barriers identified.     Interventions -- Encouraged to  attend Cardiac Rehabilitation for the exercise;Stress management education;Relaxation education Encouraged to attend Cardiac Rehabilitation for the exercise;Stress management education;Relaxation education     Continue Psychosocial Services  -- No Follow up required No Follow up required           Initial Review   Source of Stress Concerns Financial;Transportation -- --     Comments Currently on short term disability but it will run out this month. This provides him with just enough money to pay his mortgage. -- --              Psychosocial Discharge (Final Psychosocial Re-Evaluation):  Psychosocial Re-Evaluation - 12/03/20 1024       Psychosocial Re-Evaluation   Current issues with None Identified    Comments Patient has completed 24 sessions and has no psychosocial barriers identified. He continues wellbutrin for smoking cessation and is smoking 5 cigerattes/day. He has continues to work without difficulty which has improved his stress. He continues to demonstrate an interest in improving his health and a positive attitude. We will continue to montior for progress.    Expected Outcomes Patient will continue to have no psychosocial barriers identified.    Interventions Encouraged to attend Cardiac Rehabilitation for the exercise;Stress management education;Relaxation education    Continue Psychosocial Services  No Follow up required             Vocational Rehabilitation: Provide vocational rehab assistance to qualifying candidates.   Vocational Rehab Evaluation & Intervention:  Vocational Rehab - 10/01/20 1446       Initial Vocational Rehab Evaluation & Intervention   Assessment shows need for Vocational Rehabilitation Yes    Documents faxed to [place] [facility] 10/01/20   referral form filled out via phone            Education: Education Goals: Education classes will be provided on a weekly basis, covering required topics. Participant will state  understanding/return demonstration of topics presented.  Learning Barriers/Preferences:  Learning Barriers/Preferences - 10/01/20 1300       Learning Barriers/Preferences   Learning Barriers Reading    Learning Preferences Individual Instruction;Skilled Demonstration             Education Topics: Hypertension, Hypertension Reduction -Define heart disease and high blood pressure. Discus how high blood pressure affects the body and ways to reduce high blood pressure. Flowsheet Row CARDIAC REHAB PHASE II EXERCISE from 12/05/2020 in [place] CARDIAC REHABILITATION  Date 12/05/20  Educator HJ  Instruction Review Code 2- Demonstrated Understanding       Exercise and Your Heart -Discuss why it is important to exercise, the FITT principles of exercise, normal and abnormal responses to exercise, and how to exercise safely.   Angina -Discuss definition of angina, causes of angina, treatment of angina, and how to decrease risk of having angina.   Cardiac Medications -Review what the following cardiac medications are used for, how they affect the body, and side effects that may occur when taking the medications.  Medications include Aspirin, Beta blockers, calcium channel blockers, ACE Inhibitors, angiotensin receptor blockers, diuretics, digoxin, and antihyperlipidemics.   Congestive Heart Failure -Discuss the definition of CHF, how to live with CHF, the signs and symptoms of CHF, and how keep track of weight and sodium intake. Flowsheet Row CARDIAC REHAB PHASE II EXERCISE from 12/05/2020 in [place] CARDIAC REHABILITATION  Date 10/03/20  Educator DF  Instruction Review Code 2- Demonstrated Understanding       Heart Disease and Intimacy -Discus the effect sexual activity has on the heart, how changes occur during intimacy as we age, and safety during sexual activity.   Smoking Cessation / COPD -Discuss different methods to quit smoking, the health benefits of quitting  smoking, and the definition of COPD. Flowsheet Row CARDIAC REHAB PHASE II EXERCISE from 12/05/2020 in [place] CARDIAC REHABILITATION  Date 10/17/20  Educator DJ  Instruction Review Code 1- Verbalizes Understanding       Nutrition I: Fats -Discuss the types of cholesterol, what cholesterol does to the heart, and how cholesterol levels can be controlled.   Nutrition II: Labels -Discuss the different  components of food labels and how to read food label Flowsheet Row CARDIAC REHAB PHASE II EXERCISE from 12/05/2020 in [place] CARDIAC REHABILITATION  Date 11/07/20  Educator DF  Instruction Review Code 2- Demonstrated Understanding       Heart Parts/Heart Disease and PAD -Discuss the anatomy of the heart, the pathway of blood circulation through the heart, and these are affected by heart disease.   Stress I: Signs and Symptoms -Discuss the causes of stress, how stress may lead to anxiety and depression, and ways to limit stress. Flowsheet Row CARDIAC REHAB PHASE II EXERCISE from 12/05/2020 in [place] CARDIAC REHABILITATION  Date 11/21/20  Educator hj  Instruction Review Code 2- Demonstrated Understanding       Stress II: Relaxation -Discuss different types of relaxation techniques to limit stress.   Warning Signs of Stroke / TIA -Discuss definition of a stroke, what the signs and symptoms are of a stroke, and how to identify when someone is having stroke.   Knowledge Questionnaire Score:  Knowledge Questionnaire Score - 10/01/20 1301       Knowledge Questionnaire Score   Pre Score 24/28             Core Components/Risk Factors/Patient Goals at Admission:  Personal Goals and Risk Factors at Admission - 10/01/20 1305       Core Components/Risk Factors/Patient Goals on Admission    Weight Management Yes;Weight Maintenance    Intervention Weight Management: Develop a combined nutrition and exercise program designed to reach desired caloric intake, while  maintaining appropriate intake of nutrient and fiber, sodium and fats, and appropriate energy expenditure required for the weight goal.;Weight Management: Provide education and appropriate resources to help participant work on and attain dietary goals.    Expected Outcomes Short Term: Continue to assess and modify interventions until short term weight is achieved;Long Term: Adherence to nutrition and physical activity/exercise program aimed toward attainment of established weight goal;Weight Maintenance: Understanding of the daily nutrition guidelines, which includes 25-35% calories from fat, 7% or less cal from saturated fats, less than 200mg  cholesterol, less than 1.5gm of sodium, & 5 or more servings of fruits and vegetables daily    Tobacco Cessation Yes    Number of packs per day 1/2    Intervention Assist the participant in steps to quit. Provide individualized education and counseling about committing to Tobacco Cessation, relapse prevention, and pharmacological support that can be provided by physician.; , assist with locating and accessing local/national Quit Smoking programs, and support quit date choice.    Expected Outcomes Short Term: Will demonstrate readiness to quit, by selecting a quit date.;Short Term: Will quit all tobacco product use, adhering to prevention of relapse plan.;Long Term: Complete abstinence from all tobacco products for at least 12 months from quit date.    Improve shortness of breath with ADL's Yes    Intervention Provide education, individualized exercise plan and daily activity instruction to help decrease symptoms of SOB with activities of daily living.    Expected Outcomes Short Term: Improve cardiorespiratory fitness to achieve a reduction of symptoms when performing ADLs;Long Term: Be able to perform more ADLs without symptoms or delay the onset of symptoms    Stress Yes    Intervention Offer individual and/or small group education and  counseling on adjustment to heart disease, stress management and health-related lifestyle change. Teach and support self-help strategies.;Refer participants experiencing significant psychosocial distress to appropriate mental health specialists for further evaluation and treatment. When possible, include  family members and significant others in education/counseling sessions.    Expected Outcomes Short Term: Participant demonstrates changes in health-related behavior, relaxation and other stress management skills, ability to obtain effective social support, and compliance with psychotropic medications if prescribed.;Long Term: Emotional wellbeing is indicated by absence of clinically significant psychosocial distress or social isolation.             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 He has completed 2 sessions maintaining his weight since his initial visit. Patient was put on a life vest since 2/22 due to EF 30-35%. He continues to wear with hopes that his EF will return to normal. Last ECHO was improved with 40-45% EF 7/12. He is using Wellburtin for smoking cessation and is currently smoking 1/2 pack/day. His personal goals for the program are to improve his SOB with activity; quit smoking; get life vest off so he can return to work; get stronger. We will continue to  monitor his progress as he works towards meetings these goals. Patient has completed 14 sessions gaining 2 lbs since his initial visit. He is doing well in the program with consistent attendance and progression. He saw Dr. Shirlee Latch in the CHF  clinis 8/31. With his improved EF for 40% now, he discontinued digoxin and losartan and added Entresto BID and discontinued his life vest enabling patient to return to work which he did last week. He was encouraged to follow up with an ENT due to abnormal PFT results which he is going to do in [place]. He was also referred to cancer screening dur to smoking. He has a chest x-ray which showed a lung nodule which he is to follow up with CT in 3 months. His personal goals for the program continue to be to decrease his SOB with activity; get stronger; get life vest off and return to work. He has meet these goals particially and we will continue to montior his progress as he works towards meeting all goals. Patient has completed 24 sessions losing 4 lbs since last 30 day review. He continue to do well in the program with consistent attendance and progression. His blood pressure is well controlled. His personal goals for the program continue to be to decrease his SOB with activity and get stronger. He is pleased with his progress in the program. We will continue to montior his progress as he works towards meeting these.     Expected Outcomes Patient will complete the program meeting both personal and program goals. Patient will complete the program meeting both personal and program goals. Patient will complete the program meeting both personal and program goals.              Core Components/Risk Factors/Patient Goals at Discharge (Final Review):   Goals and Risk Factor Review - 12/03/20 1024       Core Components/Risk Factors/Patient Goals Review   Personal Goals Review Tobacco Cessation;Weight Management/Obesity;Stress    Review Patient has completed 24 sessions losing 4 lbs since last 30 day review. He continue to do well in the program with consistent attendance and progression. His blood pressure is well controlled. His personal goals for the  program continue to be to decrease his SOB with activity and  get stronger. He is pleased with his progress in the program. We will continue to montior his progress as he works towards meeting these.    Expected Outcomes Patient will complete the program meeting both personal and program goals.             ITP Comments:   Comments: ITP REVIEW Pt is making expected progress toward Cardiac Rehab goals after completing 27 sessions. Recommend continued exercise, life style modification, education, and increased stamina and strength.

## 2020-12-14 ENCOUNTER — Encounter (HOSPITAL_COMMUNITY)
Admission: RE | Admit: 2020-12-14 | Discharge: 2020-12-14 | Disposition: A | Discharge status: Home / Self Care | Payer: BC Managed Care – PPO | Source: Ambulatory Visit | Attending: Cardiovascular Disease | Admitting: Cardiovascular Disease

## 2020-12-14 DIAGNOSIS — I2102 ST elevation (STEMI) myocardial infarction involving left anterior descending coronary artery: Secondary | ICD-10-CM

## 2020-12-14 DIAGNOSIS — Z955 Presence of coronary angioplasty implant and graft: Secondary | ICD-10-CM

## 2020-12-14 NOTE — Progress Notes (Signed)
Daily Session Note  Patient Details  Name: [name] MRN: [id number] Date of Birth: 21-Jun-1965 Referring Provider:   Flowsheet Row CARDIAC REHAB PHASE II ORIENTATION from 10/01/2020 in [place]  Referring Provider Dr. Claiborne Billings       Encounter Date: 12/14/2020  Check In:  Session Check In - 12/14/20 0815       Check-In   Supervising physician immediately available to respond to emergencies [facility] MD immediately available    Physician(s) Dr. Audie Box    Location [facility]    Staff Present [name], BS, Exercise Physiologist;Debra Wynetta Emery, RN, BSN    Virtual Visit No    Medication changes reported     No    Fall or balance concerns reported    No    Tobacco Cessation No Change    Warm-up and Cool-down Performed as group-led instruction    Resistance Training Performed Yes    VAD Patient? No    PAD/SET Patient? No      Pain Assessment   Currently in Pain? No/denies    Pain Score 0-No pain    Multiple Pain Sites No             Capillary Blood Glucose: No results found for this or any previous visit (from the past 24 hour(s)).    Social History   Tobacco Use  Smoking Status Light Smoker   Packs/day: 0.50   Years: 51.00   Pack years: 25.50   Types: Cigarettes  Smokeless Tobacco Never  Tobacco Comments   Currently 6-8 cigs per day    Goals Met:  Independence with exercise equipment Exercise tolerated well No report of concerns or symptoms today Strength training completed today  Goals Unmet:  Not Applicable  Comments: check out 0915   Dr. Kathie Dike is Medical Director for [facility] Pulmonary Rehab.

## 2020-12-17 ENCOUNTER — Encounter (HOSPITAL_COMMUNITY)
Admission: RE | Admit: 2020-12-17 | Discharge: 2020-12-17 | Disposition: A | Discharge status: Home / Self Care | Payer: BC Managed Care – PPO | Source: Ambulatory Visit | Attending: Cardiovascular Disease | Admitting: Cardiovascular Disease

## 2020-12-17 DIAGNOSIS — I2102 ST elevation (STEMI) myocardial infarction involving left anterior descending coronary artery: Secondary | ICD-10-CM

## 2020-12-17 DIAGNOSIS — Z955 Presence of coronary angioplasty implant and graft: Secondary | ICD-10-CM

## 2020-12-17 NOTE — Progress Notes (Signed)
Daily Session Note  Patient Details  Name: [name] MRN: [id number] Date of Birth: 10/14/1965 Referring Provider:   Flowsheet Row CARDIAC REHAB PHASE II ORIENTATION from 10/01/2020 in [place]  Referring Provider Dr. Claiborne Billings       Encounter Date: 12/17/2020  Check In:  Session Check In - 12/17/20 0815       Check-In   Supervising physician immediately available to respond to emergencies [facility] MD immediately available    Physician(s) Dr. Audie Box    Location [facility]    Staff Present Hoy Register, MS, ACSM-CEP, Exercise Physiologist;Debra Wynetta Emery, RN, BSN    Virtual Visit No    Medication changes reported     No    Fall or balance concerns reported    No    Tobacco Cessation No Change    Warm-up and Cool-down Performed as group-led instruction    Resistance Training Performed Yes    VAD Patient? No    PAD/SET Patient? No      Pain Assessment   Currently in Pain? No/denies    Pain Score 0-No pain    Multiple Pain Sites No             Capillary Blood Glucose: No results found for this or any previous visit (from the past 24 hour(s)).    Social History   Tobacco Use  Smoking Status Light Smoker   Packs/day: 0.50   Years: 51.00   Pack years: 25.50   Types: Cigarettes  Smokeless Tobacco Never  Tobacco Comments   Currently 6-8 cigs per day    Goals Met:  Independence with exercise equipment Exercise tolerated well No report of concerns or symptoms today Strength training completed today  Goals Unmet:  Not Applicable  Comments: checkout time is 0915   Dr. Kathie Dike is Medical Director for [facility] Pulmonary Rehab.

## 2020-12-19 ENCOUNTER — Encounter (HOSPITAL_COMMUNITY): Payer: BC Managed Care – PPO

## 2020-12-21 ENCOUNTER — Other Ambulatory Visit: Payer: Self-pay

## 2020-12-21 ENCOUNTER — Encounter (HOSPITAL_COMMUNITY)
Admission: RE | Admit: 2020-12-21 | Discharge: 2020-12-21 | Disposition: A | Discharge status: Home / Self Care | Payer: BC Managed Care – PPO | Source: Ambulatory Visit | Attending: Cardiovascular Disease | Admitting: Cardiovascular Disease

## 2020-12-21 VITALS — Ht 71.0 in | Wt 160.1 lb

## 2020-12-21 DIAGNOSIS — I2102 ST elevation (STEMI) myocardial infarction involving left anterior descending coronary artery: Secondary | ICD-10-CM | POA: Diagnosis not present

## 2020-12-21 DIAGNOSIS — Z955 Presence of coronary angioplasty implant and graft: Secondary | ICD-10-CM

## 2020-12-21 NOTE — Progress Notes (Signed)
Daily Session Note  Patient Details  Name: [name] MRN: [id number] Date of Birth: Jul 09, 1965 Referring Provider:   Flowsheet Row CARDIAC REHAB PHASE II ORIENTATION from 10/01/2020 in [place]  Referring Provider Dr. Claiborne Billings       Encounter Date: 12/21/2020  Check In:  Session Check In - 12/21/20 0815       Check-In   Supervising physician immediately available to respond to emergencies [facility] MD immediately available    Physician(s) Dr. Domenic Polite    Location [facility]    Staff Present Geanie Cooley, RN;Heather Otho Ket, BS, Exercise Physiologist;Dalton Kris Mouton, MS, ACSM-CEP, Exercise Physiologist    Virtual Visit No    Medication changes reported     No    Fall or balance concerns reported    No    Tobacco Cessation No Change    Warm-up and Cool-down Performed as group-led instruction    Resistance Training Performed Yes    VAD Patient? No    PAD/SET Patient? No      Pain Assessment   Currently in Pain? No/denies    Pain Score 0-No pain    Multiple Pain Sites No             Capillary Blood Glucose: No results found for this or any previous visit (from the past 24 hour(s)).    Social History   Tobacco Use  Smoking Status Light Smoker   Packs/day: 0.50   Years: 51.00   Pack years: 25.50   Types: Cigarettes  Smokeless Tobacco Never  Tobacco Comments   Currently 6-8 cigs per day    Goals Met:  Independence with exercise equipment Exercise tolerated well No report of concerns or symptoms today Strength training completed today  Goals Unmet:  Not Applicable  Comments: check out @ 9:15am   Dr. Kathie Dike is Medical Director for [facility].

## 2020-12-24 ENCOUNTER — Encounter (HOSPITAL_COMMUNITY): Payer: BC Managed Care – PPO

## 2020-12-25 NOTE — Progress Notes (Signed)
Discharge Progress Report  Patient Details  Name: [name] MRN: [id number] Date of Birth: [birth year] Referring Provider:   Flowsheet Row CARDIAC REHAB PHASE II ORIENTATION from 10/01/2020 in [place]  Referring Provider Dr. Claiborne Billings        Number of Visits: 30  Reason for Discharge:  Patient reached a stable level of exercise. Patient independent in their exercise. Patient has met program and personal goals.  Smoking History:  Social History   Tobacco Use  Smoking Status Light Smoker   Packs/day: 0.50   Years: 51.00   Pack years: 25.50   Types: Cigarettes  Smokeless Tobacco Never  Tobacco Comments   Currently 6-8 cigs per day    Diagnosis:  ST elevation myocardial infarction involving left anterior descending (LAD) coronary artery (HCC)  Status post coronary artery stent placement  ADL [facility]:   Initial Exercise Prescription:  Initial Exercise Prescription - 10/01/20 1400       Date of Initial Exercise RX and Referring Provider   Date 10/01/20    Referring Provider Dr. Claiborne Billings    Expected Discharge Date 12/24/20      Treadmill   MPH 1.5    Grade 0    Minutes 17      NuStep   Level 1    SPM 80    Minutes 22      Prescription Details   Frequency (times per week) 3    Duration Progress to 30 minutes of continuous aerobic without signs/symptoms of physical distress      Intensity   THRR 40-80% of Max Heartrate 65-130    Ratings of Perceived Exertion 11-13    Perceived Dyspnea 0-4      Resistance Training   Training Prescription Yes    Weight 4 lbs    Reps 10-15             Discharge Exercise Prescription (Final Exercise Prescription Changes):  Exercise Prescription Changes - 12/10/20 1200       Response to Exercise   Blood Pressure (Admit) 106/58    Blood Pressure (Exercise) 140/60    Blood Pressure (Exit) 96/52    Heart Rate (Admit) 65 bpm    Heart Rate (Exercise) 102 bpm    Heart Rate (Exit) 90 bpm     Rating of Perceived Exertion (Exercise) 12    Duration Continue with 30 min of aerobic exercise without signs/symptoms of physical distress.    Intensity THRR unchanged      Progression   Progression Continue to progress workloads to maintain intensity without signs/symptoms of physical distress.      Resistance Training   Training Prescription Yes    Weight 5    Reps 10-15    Time 10 Minutes      Treadmill   MPH 2.6    Grade 3.5    Minutes 22    METs 4.25      NuStep   Level 3    SPM 127    Minutes 17    METs 5.23             Functional Capacity:  6 Minute Walk     Row Name 10/01/20 1421 12/21/20 0917       6 Minute Walk   Phase Initial Discharge    Distance 1300 feet 1800 feet    Distance Feet Change -- 400 ft    Walk Time 6 minutes 6 minutes    # of Rest Breaks 0 0  MPH 2.46 3.4    METS 3.62 4.69    RPE 11 9    VO2 Peak 12.65 16.43    Symptoms Yes (comment) No    Comments Right hip pain 4/10 --    Resting HR 74 bpm 78 bpm    Resting BP 90/60 105/60    Resting Oxygen Saturation  98 % 98 %    Exercise Oxygen Saturation  during 6 min walk 99 % 98 %    Max Ex. HR 80 bpm 92 bpm    Max Ex. BP 98/70 115/60    2 Minute Post BP 86/58 100/60             Psychological, QOL, Others - Outcomes: PHQ 2/9: Depression screen [facility] 2/9 12/25/2020 10/01/2020  Decreased Interest 0 0  Down, Depressed, Hopeless 0 0  PHQ - 2 Score 0 0  Altered sleeping 1 1  Tired, decreased energy 0 1  Change in appetite 0 0  Feeling bad or failure about yourself  0 0  Trouble concentrating 0 0  Moving slowly or fidgety/restless 0 0  Suicidal thoughts 0 0  PHQ-9 Score 1 2  Difficult doing work/chores Not difficult at all Not difficult at all    Quality of Life:  Quality of Life - 12/21/20 1408       Quality of Life Scores   Health/Function Pre 9.75 %    Health/Function Post 29.2 %    Health/Function % Change 199.49 %    Socioeconomic Pre 15.64 %    Socioeconomic Post  25.14 %    Socioeconomic % Change  60.74 %    Psych/Spiritual Pre 24.29 %    Psych/Spiritual Post 30 %    Psych/Spiritual % Change 23.51 %    Family Pre 9.75 %    Family Post 28.8 %    Family % Change 195.38 %    GLOBAL Pre 14.52 %    GLOBAL Post 28.47 %    GLOBAL % Change 96.07 %             Personal Goals: Goals established at orientation with interventions provided to work toward goal.  Personal Goals and Risk Factors at Admission - 10/01/20 1305       Core Components/Risk Factors/Patient Goals on Admission    Weight Management Yes;Weight Maintenance    Intervention Weight Management: Develop a combined nutrition and exercise program designed to reach desired caloric intake, while maintaining appropriate intake of nutrient and fiber, sodium and fats, and appropriate energy expenditure required for the weight goal.;Weight Management: Provide education and appropriate resources to help participant work on and attain dietary goals.    Expected Outcomes Short Term: Continue to assess and modify interventions until short term weight is achieved;Long Term: Adherence to nutrition and physical activity/exercise program aimed toward attainment of established weight goal;Weight Maintenance: Understanding of the daily nutrition guidelines, which includes 25-35% calories from fat, 7% or less cal from saturated fats, less than 280m cholesterol, less than 1.5gm of sodium, & 5 or more servings of fruits and vegetables daily    Tobacco Cessation Yes    Number of packs per day 1/2    Intervention Assist the participant in steps to quit. Provide individualized education and counseling about committing to Tobacco Cessation, relapse prevention, and pharmacological support that can be provided by physician.;OAdvice worker assist with locating and accessing local/national Quit Smoking programs, and support quit date choice.    Expected Outcomes Short Term: Will demonstrate readiness  to  quit, by selecting a quit date.;Short Term: Will quit all tobacco product use, adhering to prevention of relapse plan.;Long Term: Complete abstinence from all tobacco products for at least 12 months from quit date.    Improve shortness of breath with ADL's Yes    Intervention Provide education, individualized exercise plan and daily activity instruction to help decrease symptoms of SOB with activities of daily living.    Expected Outcomes Short Term: Improve cardiorespiratory fitness to achieve a reduction of symptoms when performing ADLs;Long Term: Be able to perform more ADLs without symptoms or delay the onset of symptoms    Stress Yes    Intervention Offer individual and/or small group education and counseling on adjustment to heart disease, stress management and health-related lifestyle change. Teach and support self-help strategies.;Refer participants experiencing significant psychosocial distress to appropriate mental health specialists for further evaluation and treatment. When possible, include family members and significant others in education/counseling sessions.    Expected Outcomes Short Term: Participant demonstrates changes in health-related behavior, relaxation and other stress management skills, ability to obtain effective social support, and compliance with psychotropic medications if prescribed.;Long Term: Emotional wellbeing is indicated by absence of clinically significant psychosocial distress or social isolation.              Personal Goals Discharge:  Goals and Risk Factor Review     Row Name 10/08/20 1001 11/07/20 1010 12/03/20 1024 12/25/20 0959       Core Components/Risk Factors/Patient Goals Review   Personal Goals Review Tobacco Cessation;Weight Management/Obesity;Stress Tobacco Cessation;Weight Management/Obesity;Stress Tobacco Cessation;Weight Management/Obesity;Stress Tobacco Cessation;Weight Management/Obesity;Stress    Review Patient was referrred to CR with  STEMI and DES. He has completed 2 sessions maintaining his weight since his initial visit. Patient was put on a life vest since 2/22 due to EF 30-35%. He continues to wear with hopes that his EF will return to normal. Last ECHO was improved with 40-45% EF 7/12. He is using Wellb[employer]in for smoking cessation and is currently smoking 1/2 pack/day. His personal goals for the program are to improve his SOB with activity; quit smoking; get life vest off so he can return to work; get stronger. We will continue to  monitor his progress as he works towards meetings these goals. Patient has completed 14 sessions gaining 2 lbs since his initial visit. He is doing well in the program with consistent attendance and progression. He saw Dr. Aundra Dubin in the CHF clinis 8/31. With his improved EF for 40% now, he discontinued digoxin and losartan and added Entresto BID and discontinued his life vest enabling patient to return to work which he did last week. He was encouraged to follow up with an ENT due to abnormal PFT results which he is going to do in [place]. He was also referred to cancer screening dur to smoking. He has a chest x-ray which showed a lung nodule which he is to follow up with CT in 3 months. His personal goals for the program continue to be to decrease his SOB with activity; get stronger; get life vest off and return to work. He has meet these goals particially and we will continue to montior his progress as he works towards meeting all goals. Patient has completed 24 sessions losing 4 lbs since last 30 day review. He continue to do well in the program with consistent attendance and progression. His blood pressure is well controlled. His personal goals for the program continue to be to decrease his SOB with activity  and get stronger. He is pleased with his progress in the program. We will continue to montior his progress as he works towards meeting these. Pt graduated after 30 sessions. His weight was stable from  start to finish and his blood pressures were WNL. He had consistent attendance and progressed well throughout the program. He still continues to smoke about 5 cigarettes per day. His stress levels have been reduced since he has returned back to work and he no longer has to wear the life vest.    Expected Outcomes Patient will complete the program meeting both personal and program goals. Patient will complete the program meeting both personal and program goals. Patient will complete the program meeting both personal and program goals. Patient will continue to work towards their goals post discharge.             Exercise Goals and Review:  Exercise Goals     Row Name 10/01/20 1432 10/16/20 7616 11/13/20 0922 12/10/20 1232       Exercise Goals   Increase Physical Activity Yes Yes Yes Yes    Intervention Provide advice, education, support and counseling about physical activity/exercise needs.;Develop an individualized exercise prescription for aerobic and resistive training based on initial evaluation findings, risk stratification, comorbidities and participant's personal goals. Provide advice, education, support and counseling about physical activity/exercise needs.;Develop an individualized exercise prescription for aerobic and resistive training based on initial evaluation findings, risk stratification, comorbidities and participant's personal goals. Provide advice, education, support and counseling about physical activity/exercise needs.;Develop an individualized exercise prescription for aerobic and resistive training based on initial evaluation findings, risk stratification, comorbidities and participant's personal goals. Provide advice, education, support and counseling about physical activity/exercise needs.;Develop an individualized exercise prescription for aerobic and resistive training based on initial evaluation findings, risk stratification, comorbidities and participant's personal goals.     Expected Outcomes Short Term: Attend rehab on a regular basis to increase amount of physical activity.;Long Term: Add in home exercise to make exercise part of routine and to increase amount of physical activity.;Long Term: Exercising regularly at least 3-5 days a week. Short Term: Attend rehab on a regular basis to increase amount of physical activity.;Long Term: Add in home exercise to make exercise part of routine and to increase amount of physical activity.;Long Term: Exercising regularly at least 3-5 days a week. Short Term: Attend rehab on a regular basis to increase amount of physical activity.;Long Term: Add in home exercise to make exercise part of routine and to increase amount of physical activity.;Long Term: Exercising regularly at least 3-5 days a week. Short Term: Attend rehab on a regular basis to increase amount of physical activity.;Long Term: Add in home exercise to make exercise part of routine and to increase amount of physical activity.;Long Term: Exercising regularly at least 3-5 days a week.    Increase Strength and Stamina Yes Yes Yes Yes    Intervention Provide advice, education, support and counseling about physical activity/exercise needs.;Develop an individualized exercise prescription for aerobic and resistive training based on initial evaluation findings, risk stratification, comorbidities and participant's personal goals. Provide advice, education, support and counseling about physical activity/exercise needs.;Develop an individualized exercise prescription for aerobic and resistive training based on initial evaluation findings, risk stratification, comorbidities and participant's personal goals. Provide advice, education, support and counseling about physical activity/exercise needs.;Develop an individualized exercise prescription for aerobic and resistive training based on initial evaluation findings, risk stratification, comorbidities and participant's personal goals. Provide  advice, education, support and counseling about  physical activity/exercise needs.;Develop an individualized exercise prescription for aerobic and resistive training based on initial evaluation findings, risk stratification, comorbidities and participant's personal goals.    Expected Outcomes Short Term: Increase workloads from initial exercise prescription for resistance, speed, and METs.;Short Term: Perform resistance training exercises routinely during rehab and add in resistance training at home;Long Term: Improve cardiorespiratory fitness, muscular endurance and strength as measured by increased METs and functional capacity (6MWT) Short Term: Increase workloads from initial exercise prescription for resistance, speed, and METs.;Short Term: Perform resistance training exercises routinely during rehab and add in resistance training at home;Long Term: Improve cardiorespiratory fitness, muscular endurance and strength as measured by increased METs and functional capacity (6MWT) Short Term: Increase workloads from initial exercise prescription for resistance, speed, and METs.;Short Term: Perform resistance training exercises routinely during rehab and add in resistance training at home;Long Term: Improve cardiorespiratory fitness, muscular endurance and strength as measured by increased METs and functional capacity (6MWT) Short Term: Increase workloads from initial exercise prescription for resistance, speed, and METs.;Short Term: Perform resistance training exercises routinely during rehab and add in resistance training at home;Long Term: Improve cardiorespiratory fitness, muscular endurance and strength as measured by increased METs and functional capacity (6MWT)    Able to understand and use rate of perceived exertion (RPE) scale Yes Yes Yes Yes    Intervention Provide education and explanation on how to use RPE scale Provide education and explanation on how to use RPE scale Provide education and explanation on  how to use RPE scale Provide education and explanation on how to use RPE scale    Expected Outcomes Short Term: Able to use RPE daily in rehab to express subjective intensity level;Long Term:  Able to use RPE to guide intensity level when exercising independently Short Term: Able to use RPE daily in rehab to express subjective intensity level;Long Term:  Able to use RPE to guide intensity level when exercising independently Short Term: Able to use RPE daily in rehab to express subjective intensity level;Long Term:  Able to use RPE to guide intensity level when exercising independently Short Term: Able to use RPE daily in rehab to express subjective intensity level;Long Term:  Able to use RPE to guide intensity level when exercising independently    Knowledge and understanding of Target Heart Rate Range (THRR) Yes Yes Yes Yes    Intervention Provide education and explanation of THRR including how the numbers were predicted and where they are located for reference Provide education and explanation of THRR including how the numbers were predicted and where they are located for reference Provide education and explanation of THRR including how the numbers were predicted and where they are located for reference Provide education and explanation of THRR including how the numbers were predicted and where they are located for reference    Expected Outcomes Short Term: Able to state/look up THRR;Long Term: Able to use THRR to govern intensity when exercising independently;Short Term: Able to use daily as guideline for intensity in rehab Short Term: Able to state/look up THRR;Long Term: Able to use THRR to govern intensity when exercising independently;Short Term: Able to use daily as guideline for intensity in rehab Short Term: Able to state/look up THRR;Long Term: Able to use THRR to govern intensity when exercising independently;Short Term: Able to use daily as guideline for intensity in rehab Short Term: Able to  state/look up THRR;Long Term: Able to use THRR to govern intensity when exercising independently;Short Term: Able to use daily as guideline for  intensity in rehab    Able to check pulse independently Yes Yes Yes Yes    Intervention Provide education and demonstration on how to check pulse in carotid and radial arteries.;Review the importance of being able to check your own pulse for safety during independent exercise Provide education and demonstration on how to check pulse in carotid and radial arteries.;Review the importance of being able to check your own pulse for safety during independent exercise Provide education and demonstration on how to check pulse in carotid and radial arteries.;Review the importance of being able to check your own pulse for safety during independent exercise Provide education and demonstration on how to check pulse in carotid and radial arteries.;Review the importance of being able to check your own pulse for safety during independent exercise    Expected Outcomes Short Term: Able to explain why pulse checking is important during independent exercise;Long Term: Able to check pulse independently and accurately Short Term: Able to explain why pulse checking is important during independent exercise;Long Term: Able to check pulse independently and accurately Short Term: Able to explain why pulse checking is important during independent exercise;Long Term: Able to check pulse independently and accurately Short Term: Able to explain why pulse checking is important during independent exercise;Long Term: Able to check pulse independently and accurately    Understanding of Exercise Prescription Yes Yes Yes Yes    Intervention Provide education, explanation, and written materials on patient's individual exercise prescription Provide education, explanation, and written materials on patient's individual exercise prescription Provide education, explanation, and written materials on patient's  individual exercise prescription Provide education, explanation, and written materials on patient's individual exercise prescription    Expected Outcomes Short Term: Able to explain program exercise prescription;Long Term: Able to explain home exercise prescription to exercise independently Short Term: Able to explain program exercise prescription;Long Term: Able to explain home exercise prescription to exercise independently Short Term: Able to explain program exercise prescription;Long Term: Able to explain home exercise prescription to exercise independently Short Term: Able to explain program exercise prescription;Long Term: Able to explain home exercise prescription to exercise independently             Exercise Goals Re-Evaluation:  Exercise Goals Re-Evaluation     Row Name 10/16/20 7681 11/13/20 0922 12/10/20 1233         Exercise Goal Re-Evaluation   Exercise Goals Review Increase Physical Activity;Increase Strength and Stamina;Able to understand and use rate of perceived exertion (RPE) scale;Knowledge and understanding of Target Heart Rate Range (THRR);Able to check pulse independently;Understanding of Exercise Prescription Increase Physical Activity;Increase Strength and Stamina;Able to understand and use rate of perceived exertion (RPE) scale;Knowledge and understanding of Target Heart Rate Range (THRR);Able to check pulse independently;Understanding of Exercise Prescription Increase Physical Activity;Increase Strength and Stamina;Able to understand and use rate of perceived exertion (RPE) scale;Knowledge and understanding of Target Heart Rate Range (THRR);Able to check pulse independently;Understanding of Exercise Prescription     Comments Pt has attended 7 sessions of cardiac rehab. He has been progressing well and has been increasing his workloads. During one of his first sessions, we took him to the ED due to chest tightness, lightheadedness, SOB, and ventricular trigeminy. He left  AMA. He has reported no problems since that day. He states that he thinks that he was pushing himself too hard on that day. He is currently exercising at 3.52 METs on the stepper. Will continue to monitor and progress as able. Pt has attended 16 sessions of cardiac rehab. We  recently had to decrease his incline on the TM due to having a high heart rate and the inablility for his HR to recover to within 10 beats of his resting during cool down. He has begun working third shift again and comes to rehab right when he gets off of work. He has not yet adapted to the new schedule and he is noticably tired when he comes to rehab. He is currently exercising at 2.22 METs on the stepper. Will continue to monitor and progress as able. Pt has attended 27 sessions of cardiac rehab. He will graduate the program in 2 weeks. Has gone down to 2xs a week due to 3rd shift work being to much. He is often tired when arriving due to just getting off work and walking several miles while working. Current exercising at 5.23 METs while on the stepper. Will continue to monitor and progess as able.     Expected Outcomes Through exercise at rehab and at home, the patient will meet their stated goals. Through exercise at rehab and at home, the patient will meet their stated goals. Through exercise at rehab and at home, the patient will meet their stated goals.              Nutrition & Weight - Outcomes:  Pre Biometrics - 10/01/20 1432       Pre Biometrics   Height 5' 11"  (1.803 m)    Weight 160 lb 11.5 oz (72.9 kg)    Waist Circumference 33 inches    Hip Circumference 38.5 inches    Waist to Hip Ratio 0.86 %    BMI (Calculated) 22.43    Triceps Skinfold 11 mm    % Body Fat 20.5 %    Grip Strength 42.9 kg    Flexibility 0 in    Single Leg Stand 53 seconds             Post Biometrics - 12/21/20 0919        Post  Biometrics   Height 5' 11"  (1.803 m)    Weight 160 lb 0.9 oz (72.6 kg)    Waist Circumference 32  inches    Hip Circumference 39.5 inches    Waist to Hip Ratio 0.81 %    BMI (Calculated) 22.33    Triceps Skinfold 12 mm    % Body Fat 20.3 %    Grip Strength 29.7 kg    Flexibility 0 in    Single Leg Stand 60 seconds             Nutrition:  Nutrition Therapy & Goals - 10/08/20 0955       Personal Nutrition Goals   Comments Patient scored 30 on his diet assessment. We offer 2 eduacational sessions on heart healthy nutrition and assistance with RD referral if interested.      Intervention Plan   Intervention Nutrition handout(s) given to patient.             Nutrition Discharge:  Nutrition Assessments - 12/25/20 0910       MEDFICTS Scores   Pre Score 30    Post Score 35    Score Difference 5             Education Questionnaire Score:  Knowledge Questionnaire Score - 12/25/20 0909       Knowledge Questionnaire Score   Post Score 26/28             Goals reviewed with patient; copy given to patient. Pt  graduated from cardiac rehab after 30 sessions. He had consistent attendance while he was in the program and was able to progress. His stress levels were high when he entered the program but decreased substantially when he was cleared to go back to work and no longer had to wear the life vest. His weight was stable and his vitals were WNL. He still continues to smoke about five cigarettes per day. He was able to increase his six minute walk distance by 38% from orientation to discharge. His MET level at graduation was 4.65.

## 2021-01-13 ENCOUNTER — Observation Stay (HOSPITAL_COMMUNITY): Payer: BC Managed Care – PPO

## 2021-01-13 ENCOUNTER — Observation Stay (HOSPITAL_COMMUNITY)
Admission: AD | Admit: 2021-01-13 | Discharge: 2021-01-14 | Disposition: A | Discharge status: Home / Self Care | Payer: BC Managed Care – PPO | Source: Other Acute Inpatient Hospital | Attending: Cardiology | Admitting: Cardiology

## 2021-01-13 ENCOUNTER — Encounter (HOSPITAL_COMMUNITY): Payer: Self-pay | Admitting: Cardiology

## 2021-01-13 ENCOUNTER — Other Ambulatory Visit: Payer: Self-pay

## 2021-01-13 DIAGNOSIS — M4319 Spondylolisthesis, multiple sites in spine: Secondary | ICD-10-CM | POA: Diagnosis not present

## 2021-01-13 DIAGNOSIS — I200 Unstable angina: Secondary | ICD-10-CM | POA: Diagnosis not present

## 2021-01-13 DIAGNOSIS — I2510 Atherosclerotic heart disease of native coronary artery without angina pectoris: Secondary | ICD-10-CM | POA: Diagnosis not present

## 2021-01-13 DIAGNOSIS — I25119 Atherosclerotic heart disease of native coronary artery with unspecified angina pectoris: Secondary | ICD-10-CM

## 2021-01-13 DIAGNOSIS — K7689 Other specified diseases of liver: Secondary | ICD-10-CM | POA: Diagnosis not present

## 2021-01-13 DIAGNOSIS — R1013 Epigastric pain: Secondary | ICD-10-CM | POA: Diagnosis not present

## 2021-01-13 DIAGNOSIS — Z955 Presence of coronary angioplasty implant and graft: Secondary | ICD-10-CM | POA: Insufficient documentation

## 2021-01-13 DIAGNOSIS — E785 Hyperlipidemia, unspecified: Secondary | ICD-10-CM

## 2021-01-13 DIAGNOSIS — Z7902 Long term (current) use of antithrombotics/antiplatelets: Secondary | ICD-10-CM | POA: Diagnosis not present

## 2021-01-13 DIAGNOSIS — B9789 Other viral agents as the cause of diseases classified elsewhere: Secondary | ICD-10-CM | POA: Diagnosis not present

## 2021-01-13 DIAGNOSIS — Z20822 Contact with and (suspected) exposure to covid-19: Secondary | ICD-10-CM | POA: Diagnosis not present

## 2021-01-13 DIAGNOSIS — Z7982 Long term (current) use of aspirin: Secondary | ICD-10-CM | POA: Insufficient documentation

## 2021-01-13 DIAGNOSIS — I25110 Atherosclerotic heart disease of native coronary artery with unstable angina pectoris: Secondary | ICD-10-CM | POA: Diagnosis not present

## 2021-01-13 DIAGNOSIS — R079 Chest pain, unspecified: Secondary | ICD-10-CM | POA: Diagnosis not present

## 2021-01-13 DIAGNOSIS — I255 Ischemic cardiomyopathy: Secondary | ICD-10-CM

## 2021-01-13 DIAGNOSIS — F17210 Nicotine dependence, cigarettes, uncomplicated: Secondary | ICD-10-CM | POA: Insufficient documentation

## 2021-01-13 DIAGNOSIS — I745 Embolism and thrombosis of iliac artery: Secondary | ICD-10-CM | POA: Diagnosis not present

## 2021-01-13 DIAGNOSIS — B9710 Unspecified enterovirus as the cause of diseases classified elsewhere: Secondary | ICD-10-CM | POA: Diagnosis not present

## 2021-01-13 DIAGNOSIS — I739 Peripheral vascular disease, unspecified: Secondary | ICD-10-CM

## 2021-01-13 LAB — TROPONIN I (HIGH SENSITIVITY)
Troponin I (High Sensitivity): 4 ng/L (ref <18)
Troponin I (High Sensitivity): 3 ng/L (ref <18)

## 2021-01-13 LAB — CBC
HCT: 42 % (ref 39.0–52.0)
Hemoglobin: 13.9 g/dL (ref 13.0–17.0)
MCH: 29.6 pg (ref 26.0–34.0)
MCHC: 33.1 g/dL (ref 30.0–36.0)
MCV: 89.4 fL (ref 80.0–100.0)
Platelets: 284 10*3/uL (ref 150–400)
RBC: 4.7 MIL/uL (ref 4.22–5.81)
RDW: 13.2 % (ref 11.5–15.5)
WBC: 6.9 10*3/uL (ref 4.0–10.5)
nRBC: 0 % (ref 0.0–0.2)

## 2021-01-13 LAB — CREATININE, SERUM
Creatinine, Ser: 0.91 mg/dL (ref 0.61–1.24)
GFR, Estimated: >60 mL/min (ref >60)

## 2021-01-13 MED ORDER — ACETAMINOPHEN 325 MG PO TABS: 650.0000 mg | ORAL_TABLET | ORAL | Status: DC | PRN

## 2021-01-13 MED ORDER — ONDANSETRON HCL 4 MG/2ML IJ SOLN: 4.0000 mg | Freq: Four times a day (QID) | INTRAMUSCULAR | Status: DC | PRN

## 2021-01-13 MED ORDER — BUPROPION HCL ER (SR) 150 MG PO TB12
150.0000 mg | ORAL_TABLET | Freq: Two times a day (BID) | ORAL | Status: DC
  Administered 2021-01-13 – 2021-01-14 (×3): 150 mg via ORAL
  Filled 2021-01-13 (×4): qty 1

## 2021-01-13 MED ORDER — SPIRONOLACTONE 25 MG PO TABS
25.0000 mg | ORAL_TABLET | Freq: Every day | ORAL | Status: DC
  Administered 2021-01-14: 25 mg via ORAL
  Filled 2021-01-13: qty 1

## 2021-01-13 MED ORDER — DAPAGLIFLOZIN PROPANEDIOL 10 MG PO TABS
10.0000 mg | ORAL_TABLET | Freq: Every day | ORAL | Status: DC
  Administered 2021-01-13 – 2021-01-14 (×2): 10 mg via ORAL
  Filled 2021-01-13 (×2): qty 1

## 2021-01-13 MED ORDER — CLOPIDOGREL BISULFATE 75 MG PO TABS
75.0000 mg | ORAL_TABLET | Freq: Every day | ORAL | Status: DC
  Administered 2021-01-13 – 2021-01-14 (×2): 75 mg via ORAL
  Filled 2021-01-13 (×2): qty 1

## 2021-01-13 MED ORDER — ASPIRIN EC 81 MG PO TBEC
81.0000 mg | DELAYED_RELEASE_TABLET | Freq: Every day | ORAL | Status: DC
  Administered 2021-01-13 – 2021-01-14 (×2): 81 mg via ORAL
  Filled 2021-01-13 (×2): qty 1

## 2021-01-13 MED ORDER — IOHEXOL 300 MG/ML  SOLN
100.0000 mL | Freq: Once | INTRAMUSCULAR | Status: AC | PRN
  Administered 2021-01-13: 100 mL via INTRAVENOUS

## 2021-01-13 MED ORDER — ATORVASTATIN CALCIUM 80 MG PO TABS
80.0000 mg | ORAL_TABLET | Freq: Every day | ORAL | Status: DC
  Administered 2021-01-13 – 2021-01-14 (×2): 80 mg via ORAL
  Filled 2021-01-13 (×2): qty 1

## 2021-01-13 MED ORDER — NITROGLYCERIN 0.4 MG SL SUBL: 0.4000 mg | SUBLINGUAL_TABLET | SUBLINGUAL | Status: DC | PRN

## 2021-01-13 MED ORDER — CARVEDILOL 3.125 MG PO TABS
3.1250 mg | ORAL_TABLET | Freq: Two times a day (BID) | ORAL | Status: DC
  Administered 2021-01-13 – 2021-01-14 (×2): 3.125 mg via ORAL
  Filled 2021-01-13 (×3): qty 1

## 2021-01-13 MED ORDER — ENOXAPARIN SODIUM 40 MG/0.4ML IJ SOSY
40.0000 mg | PREFILLED_SYRINGE | INTRAMUSCULAR | Status: DC
  Administered 2021-01-13 – 2021-01-14 (×2): 40 mg via SUBCUTANEOUS
  Filled 2021-01-13 (×2): qty 0.4

## 2021-01-13 NOTE — Progress Notes (Addendum)
Patient transferred from [facility] at 1225hrs. Oriented to unit and plan of care for shift. Cardiology notified of patient arrival.

## 2021-01-13 NOTE — H&P (Addendum)
Cardiology Admission History and Physical:   Patient ID: [name] MRN: [id number]; DOB: Jul 29, 1965   Admission date: 01/13/2021  PCP:  Default, Provider, MD   [place] Providers Cardiologist:  Dr. Domenic Polite / need to be set up with [facility] office     Chief Complaint:  Epigastric pain  Patient Profile:   [name] is a 55 y.o. male with past medical history of CAD with DES to LAD in 2009 and 04/2020, history of methamphetamine abuse quit in 2012, ischemic cardiomyopathy, tobacco abuse, and hyperlipidemia who is being seen 01/13/2021 for the evaluation of epigastric pain. He was transferred to [facility] from [facility]  History of Present Illness:   [name] is a 55 year old male with past medical history of CAD with DES to LAD in 2009 and 04/2020, history of methamphetamine abuse quit in 2012, ischemic cardiomyopathy, tobacco abuse, and hyperlipidemia.  Patient presented with anterior lateral STEMI in February 2022, cardiac catheterization at the time revealed total occlusion of mid LAD which was treated with drug-eluting stent.  Echocardiogram at the time showed EF 25 to 30%.  Patient was placed on aspirin number Plavix and was discharged on LifeVest.  Repeat echocardiogram in July 2022 showed EF improved to 40 to 45% with normal RV.  PFT revealed obstruction at the level of the throat, he was referred to ENT service.  Patient works at [employer] in the work third shift carrying heavy load.  He says he has been compliant with aspirin and Plavix therapy and he has not noticed significant exertional chest pain or worsening dyspnea.   Yesterday afternoon around 6 PM, when he woke up, he started noticing epigastric and lower subxiphoid pain.  The symptom is reproducible with palpation, cough or deep inspiration.  Symptom also associated with nausea but no vomiting.  He went to work and started noticing worsening pain when he carry heavy load.  He eventually sought medical attention  at [facility].  Epigastric lower chest pain lasted all the way until around 2 - 3 AM this morning when he received a nitro patch.  During this time, serial troponin was negative x3, hs trop 6 --> 7 --> 6.  Liver enzymes normal.  Lipase was elevated at 88.  CBC normal.  Around 8 AM this morning, the symptoms recurred, prompting [facility] to transfer the patient to [facility] for further evaluation.   During interview, epigastric pain is clearly reproducible with deep palpation.  Case has been discussed with DOD Dr. Domenic Polite.   Past Medical History:  Diagnosis Date   Coronary arteriosclerosis    a. PCI LAD 2009 in [place];  b. 12/2010 Cath: LM: nl, LAD: patent stent mid, 25 mid, LCX nl, RCA: non dominant, nl, EF 50%   History of methamphetamine abuse ([place])    a. Used x 6 yrs, quit July 2012   History of tobacco abuse    a. 41 yrs, up to 3 ppd, quit July 2012   Hypercholesterolemia     Past Surgical History:  Procedure Laterality Date   BACK SURGERY     fusion-lumbar   CORONARY ANGIOPLASTY WITH STENT PLACEMENT     CORONARY/GRAFT ACUTE MI REVASCULARIZATION N/A 04/22/2020   Procedure: Coronary/Graft Acute MI Revascularization;  Surgeon: Troy Sine, MD;  Location: [place] CV LAB;  Service: Cardiovascular;  Laterality: N/A;   Terry HEART CATH AND CORONARY ANGIOGRAPHY N/A 04/22/2020   Procedure: Terry HEART CATH AND CORONARY ANGIOGRAPHY;  Surgeon: Troy Sine, MD;  Location: [place] CV LAB;  Service: Cardiovascular;  Laterality: N/A;     Medications Prior to Admission: Prior to Admission medications   Medication Sig Start Date End Date Taking? Authorizing Provider  aspirin EC 81 MG tablet Take 1 tablet (81 mg total) by mouth daily. 06/29/20   Verta Ellen., NP  atorvastatin (LIPITOR) 80 MG tablet Take 1 tablet (80 mg total) by mouth daily. 06/29/20   Verta Ellen., NP  buPROPion [facility] SR) 150 MG 12 hr tablet Take 1 tablet by mouth twice daily 11/16/20    Larey Dresser, MD  carvedilol (COREG) 3.125 MG tablet Take 1 tablet (3.125 mg total) by mouth 2 (two) times daily with a meal. 06/29/20   Verta Ellen., NP  clopidogrel (PLAVIX) 75 MG tablet Take 1 tablet (75 mg total) by mouth daily. 06/29/20   Verta Ellen., NP  dapagliflozin propanediol (FARXIGA) 10 MG TABS tablet Take 1 tablet (10 mg total) by mouth daily. 06/29/20   Verta Ellen., NP  nitroGLYCERIN (NITROSTAT) 0.4 MG SL tablet Place 1 tablet (0.4 mg total) under the tongue every 5 (five) minutes as needed. 06/29/20   Verta Ellen., NP  spironolactone (ALDACTONE) 25 MG tablet Take 1 tablet (25 mg total) by mouth daily. 07/27/20   Larey Dresser, MD     Allergies:   No Known Allergies  Social History:   Social History   Tobacco Use   Smoking status: Light Smoker    Packs/day: 0.50    Years: 51.00    Pack years: 25.50    Types: Cigarettes   Smokeless tobacco: Never   Tobacco comments:    Currently 6-8 cigs per day  Substance Use Topics   Alcohol use: No     Family History:   The patient's family history includes Coronary artery disease in his mother; Heart attack in his father.    ROS:  Please see the history of present illness.  All other ROS reviewed and negative.     Physical Exam/Data:   Vitals:   01/13/21 1225 01/13/21 1236  BP: 110/73   Pulse: (!) 58   Resp: 18   Temp: 97.7 F (36.5 C)   TempSrc: Oral   SpO2: 100%   Weight:  71.1 kg  Height:  5\' 11"  (1.803 m)   No intake or output data in the 24 hours ending 01/13/21 1332 Last 3 Weights 01/13/2021 12/21/2020 12/10/2020  Weight (lbs) 156 lb 12.8 oz 160 lb 0.9 oz 158 lb 15.2 oz  Weight (kg) 71.124 kg 72.6 kg 72.1 kg     Body mass index is 21.87 kg/m.  General:  Well nourished, well developed, in no acute distress HEENT: normal Neck: no JVD Vascular: No carotid bruits; Distal pulses 2+ bilaterally   Cardiac:  normal S1, S2; RRR; no murmur  Lungs:  clear to auscultation  bilaterally, no wheezing, rhonchi or rales  Abd: soft, no hepatomegaly. Epigastric area tender to palpitation Ext: no edema Musculoskeletal:  No deformities, BUE and BLE strength normal and equal Skin: warm and dry  Neuro:  CNs 2-12 intact, no focal abnormalities noted Psych:  Normal affect    EKG:  The ECG that was done at [facility] ED and not available to me, EKG will be repeated  Relevant CV Studies:  Cath 04/22/2020 2nd Diag lesion is 20% stenosed. Mid LAD to Dist LAD lesion is 100% stenosed. Post intervention, there is a 0% residual stenosis.  Mid LAD lesion is 20% stenosed. A stent was successfully placed. The Terry ventricular ejection fraction is 25-35% by visual estimate. LV end diastolic pressure is mildly elevated. There is severe Terry ventricular systolic dysfunction.   Acute anterolateral myocardial infarction secondary to total occlusion of the LAD immediately proximal to the previously placed mid LAD stent with initial TIMI 0 flow.   No significant concomitant CAD with a normal ramus intermediate, a large dominant normal Terry circumflex vessel, and a normal nondominant RCA.   Successful PCI to the LAD with ultimate insertion of a 3.0 x 26 mm Resolute stent postdilated with taper from 3.2-3.79mm with residual stenosis being reduced to 0%.  There is resumption of brisk TIMI-3 flow.  There is no change in the mild 20% stenosis proximal to the second diagonal vessel and 20% ostial second diagonal stenosis.   Severe LV dysfunction with severe hypocontractility involving the mid, distal anterolateral wall extending around the apex and involving the apical inferior segment with EF estimated at approximately 25 -30% acutely.  LVEDP 19 mm.   RECOMMENDATION: DAPT for minimum of 12 months but recommend longer duration.  Guideline directed medical therapy for LV dysfunction with initiation of carvedilol, with subsequent ARB and potential transition to Entresto, aldosterone blockade  and possible SGLT2 inhibition depending upon LV function recovery.  Aggressive lipid-lowering therapy with target LDL less than 70.  Smoking cessation.    Echo 04/23/2020  1. LVEF is severely depressed There is akinesis of the mid, distal and  apical LV apex      Global longitudinal strain is -8.2%. Terry ventricular ejection  fraction, by estimation, is 25 to 30%. The Terry ventricle has severely  decreased function. Terry ventricular diastolic parameters are consistent  with Grade I diastolic dysfunction  (impaired relaxation).   2. Right ventricular systolic function is normal. The right ventricular  size is normal. There is normal pulmonary artery systolic pressure.   3. The mitral valve is normal in structure. Trivial mitral valve  regurgitation.   4. The aortic valve is abnormal. Aortic valve regurgitation is not  visualized. Mild to moderate aortic valve sclerosis/calcification is  present, without any evidence of aortic stenosis.   5. The inferior vena cava is normal in size with greater than 50%  respiratory variability, suggesting right atrial pressure of 3 mmHg.    Echo 09/11/2020  1. Terry ventricular ejection fraction, by estimation, is 40 to 45%. The  Terry ventricle has mildly decreased function. The Terry ventricle  demonstrates regional wall motion abnormalities (see scoring  diagram/findings for description). Terry ventricular  diastolic parameters are consistent with Grade I diastolic dysfunction  (impaired relaxation).   2. Right ventricular systolic function is normal. The right ventricular  size is normal. There is normal pulmonary artery systolic pressure. The  estimated right ventricular systolic pressure is [id number] mmHg.   3. The mitral valve is grossly normal. No evidence of mitral valve  regurgitation.   4. The aortic valve is tricuspid. Aortic valve regurgitation is not  visualized.   5. The inferior vena cava is normal in size with greater than 50%  respiratory  variability, suggesting right atrial pressure of 3 mmHg.   Comparison(s): Echocardiogram done 04/23/20 showed an EF of 25-30%.   Laboratory Data: Lab work from [facility] includes high-sensitivity troponin I normal x3, potassium 3.9, BUN 14, creatinine 1.1, AST 16, ALT 24, lipase 88, hemoglobin 14.6, platelets 301   Radiology/Studies:  Chest x-ray 05/13/2020 [facility]): EXAM:  PORTABLE CHEST 1  VIEW   COMPARISON:  10/10/2020   FINDINGS:  Lungs are clear.  No pleural effusion or pneumothorax.   The heart is normal in size.  Thoracic aortic atherosclerosis.   Assessment and Plan:   Epigastric pain  -Although patient says his current symptom is reminiscent of the previous angina from February, however epigastric pain is reproducible with deep palpation.  Serial troponin obtained at [facility] was negative despite at least 8 hours of epigastric pain. -Discussed with MD, will obtain CT of abdomen to rule out acute intra-abdominal issues.  Obtain serial troponin and EKG.  If negative, will proceed with Myoview tomorrow.  CAD: On aspirin and Plavix.  Received DES to mid LAD in February 2022  Hyperlipidemia: On Lipitor  Ischemic cardiomyopathy: Euvolemic on exam.  EF 40 to 45%  Tobacco abuse   Risk Assessment/Risk Scores:    HEAR Score (for undifferentiated chest pain):  HEAR Score: 3       Severity of Illness: The appropriate patient status for this patient is OBSERVATION. Observation status is judged to be reasonable and necessary in order to provide the required intensity of service to ensure the patient's safety. The patient's presenting symptoms, physical exam findings, and initial radiographic and laboratory data in the context of their medical condition is felt to place them at decreased risk for further clinical deterioration. Furthermore, it is anticipated that the patient will be medically stable for discharge from the hospital within 2 midnights of admission.     For questions or updates, please contact [facility] Please consult [email or web address] for contact info under     Signed, Azalee Course, PA  01/13/2021 1:32 PM     Attending note: Patient seen and examined.  I reviewed his records and discussed the case with [name].  [name] in transfer from [facility] ER having presented there with epigastric discomfort.  He has a history of CAD status post DES to the LAD in 2009 and more recently anterolateral STEMI in February of this year with DES overlapping prior LAD stent and otherwise mild residual disease.  He has an ischemic cardiomyopathy with LVEF 40 to 45% by evaluation in July and has had most recent follow-up with Dr. Shirlee Latch in the heart failure clinic, pending visit in December.  He reports compliance with all of his medications and no exertional symptoms leading up to this presentation.  He states that he suddenly noticed an epigastric discomfort after having coffee and eating at around 6 PM yesterday when he woke up (works third shift).  He took a nitroglycerin at home which did not resolve symptoms, was placed on nitroglycerin patch at [facility] ER reportedly with improvement in symptoms, but also was also apparently given something else for pain through his IV and states that he went to sleep.  He had recurrence of discomfort this morning resulting in transfer.  All of his cardiac enzymes have been normal however.  Lipase was noted to be elevated at 88.  On examination this afternoon he appears comfortable, no active symptoms.  He is afebrile, heart rate is in the 50s to 60s in sinus rhythm by telemetry which I personally reviewed.  Blood pressure 110/73.  Lungs are clear.  Cardiac exam reveals RRR without gallop.  Epigastrium mildly tender, no guarding or rebound.  Bowel sounds present.  Lab work from [facility] reviewed above.  Follow-up lab work pending here as well as ECG which reportedly did not show acute  changes on assessment  at [facility].  Patient Terry with prolonged episode of epigastric discomfort reminiscent of prior ACS symptoms in February, however not associated with abnormal high-sensitivity troponin I levels despite at least 8 hours of pain.  Plan to obtain follow-up lab work here including ECG.  We will obtain a CT of the abdomen in light of elevated lipase and some tenderness of the epigastric region on palpation.  Continue present cardiac regimen.  If no acute abdominal etiology uncovered, plan follow-up Myoview to reassess ischemic burden tomorrow.  Satira Sark, M.D., F.A.C.C.

## 2021-01-14 ENCOUNTER — Observation Stay (HOSPITAL_BASED_OUTPATIENT_CLINIC_OR_DEPARTMENT_OTHER): Payer: BC Managed Care – PPO

## 2021-01-14 DIAGNOSIS — I255 Ischemic cardiomyopathy: Secondary | ICD-10-CM | POA: Diagnosis not present

## 2021-01-14 DIAGNOSIS — R079 Chest pain, unspecified: Secondary | ICD-10-CM

## 2021-01-14 DIAGNOSIS — Z7902 Long term (current) use of antithrombotics/antiplatelets: Secondary | ICD-10-CM | POA: Diagnosis not present

## 2021-01-14 DIAGNOSIS — Z7982 Long term (current) use of aspirin: Secondary | ICD-10-CM | POA: Diagnosis not present

## 2021-01-14 DIAGNOSIS — I2510 Atherosclerotic heart disease of native coronary artery without angina pectoris: Secondary | ICD-10-CM

## 2021-01-14 DIAGNOSIS — R1013 Epigastric pain: Secondary | ICD-10-CM | POA: Diagnosis not present

## 2021-01-14 DIAGNOSIS — Z955 Presence of coronary angioplasty implant and graft: Secondary | ICD-10-CM | POA: Diagnosis not present

## 2021-01-14 DIAGNOSIS — I739 Peripheral vascular disease, unspecified: Secondary | ICD-10-CM

## 2021-01-14 DIAGNOSIS — F17210 Nicotine dependence, cigarettes, uncomplicated: Secondary | ICD-10-CM | POA: Diagnosis not present

## 2021-01-14 LAB — LIPID PANEL
Cholesterol: 104 mg/dL (ref 0–200)
HDL: 29 mg/dL — ABNORMAL LOW (ref >40)
LDL Cholesterol: 56 mg/dL (ref 0–99)
Total CHOL/HDL Ratio: 3.6 RATIO
Triglycerides: 94 mg/dL (ref <150)
VLDL: 19 mg/dL (ref 0–40)

## 2021-01-14 LAB — NM MYOCAR MULTI W/SPECT W/WALL MOTION / EF
Estimated workload: 1
Exercise duration (min): 0 min
Exercise duration (sec): 0 s
MPHR: 163 {beats}/min
Peak HR: 93 {beats}/min
Percent HR: 57 %
Rest HR: 56 {beats}/min
ST Depression (mm): 0 mm

## 2021-01-14 LAB — BASIC METABOLIC PANEL
Anion gap: 8 (ref 5–15)
BUN: 13 mg/dL (ref 6–20)
CO2: 22 mmol/L (ref 22–32)
Calcium: 8.8 mg/dL — ABNORMAL LOW (ref 8.9–10.3)
Chloride: 107 mmol/L (ref 98–111)
Creatinine, Ser: 0.89 mg/dL (ref 0.61–1.24)
GFR, Estimated: >60 mL/min (ref >60)
Glucose, Bld: 111 mg/dL — ABNORMAL HIGH (ref 70–99)
Potassium: 3.8 mmol/L (ref 3.5–5.1)
Sodium: 137 mmol/L (ref 135–145)

## 2021-01-14 MED ORDER — PANTOPRAZOLE SODIUM 40 MG PO TBEC: 40.0000 mg | DELAYED_RELEASE_TABLET | Freq: Every day | ORAL | 1 refills | Status: DC

## 2021-01-14 MED ORDER — TECHNETIUM TC 99M TETROFOSMIN IV KIT
11.0000 | PACK | Freq: Once | INTRAVENOUS | Status: AC | PRN
  Administered 2021-01-14: 11 via INTRAVENOUS

## 2021-01-14 MED ORDER — TECHNETIUM TC 99M TETROFOSMIN IV KIT
31.6000 | PACK | Freq: Once | INTRAVENOUS | Status: AC | PRN
  Administered 2021-01-14: 31.6 via INTRAVENOUS

## 2021-01-14 MED ORDER — REGADENOSON 0.4 MG/5ML IV SOLN
INTRAVENOUS | Status: AC
  Administered 2021-01-14: 0.4 mg via INTRAVENOUS
  Filled 2021-01-14: qty 5

## 2021-01-14 MED ORDER — REGADENOSON 0.4 MG/5ML IV SOLN
0.4000 mg | Freq: Once | INTRAVENOUS | Status: AC
  Filled 2021-01-14: qty 5

## 2021-01-14 NOTE — Progress Notes (Signed)
Mobility Specialist Progress Note    01/14/21 1653  Mobility  Activity Ambulated in hall  Level of Assistance Independent  Assistive Device None  Distance Ambulated (ft) 480 ft  Mobility Ambulated independently in hallway  Mobility Response Tolerated well  Mobility performed by Mobility specialist  $Mobility charge 1 Mobility   Pt received in doorway and agreeable. No complaints on walk. Ready to return home and drink a Mt. Dew.   [place] Mobility Specialist  Mobility Specialist Phone: [phone]

## 2021-01-14 NOTE — Discharge Summary (Signed)
Discharge Summary    Patient ID: [name] MRN: [id number]; DOB: 1965-08-27  Admit date: 01/13/2021 Discharge date: 01/14/2021  PCP:  Default, Provider, MD   [facility] Providers Cardiologist: Dr. Diona Browner  Discharge Diagnoses    Principal Problem:   Epigastric pain Active Problems:   CAD (coronary artery disease)   Hyperlipidemia   Ischemic cardiomyopathy   PAD (peripheral artery disease) (HCC)    Diagnostic Studies/Procedures    Abdominal/Pelvic CT 01/13/2021: Impression: Partially visualized subendocardial hypoattenuation within the LAD territory, to a greater extent in comparison to noncontrast chest CT in August. Apical inferior myocardial thinning compatible with old infarct. Findings could potentially represent peri-infarct ischemia in this patient with prior reported LAD territory infarct. Recommend continued serial troponins and EKGs as recommended by cardiology.   No acute abdominopelvic abnormality.   Atherosclerotic disease of the aorta and branch vessels. 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 This angulation can be seen with median arcuate ligament syndrome in the appropriate clinical setting. Patent SMA and IMA.   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 Reconstitution distally by collateral vessels leading into the common femoral artery which is patent. The partially visualized proximal SFA and deep profunda arteries are patent. _____________   Steffanie Dunn 01/14/2021:   Lexiscan stress is electrically negative for ischemia .   Nuclear  images show decreased tracer activity in the inferior wall (base, mid, distal), distal inferoseptal and apical walls    In the stress images there is mild improvement in the mid region, not significant for ischemia Overall consistent with scar and possible soft tissue attenuation with minimal  periinfarct ischemia.   LVEF 47%  with distal inferior and apical akinesis   Intermediate risk scan   Compared to previous scan inferior changes more prominent, LVEF relatively unchanged.  History of Present Illness     [name] is a 55 y.o. male with a history of CAD s/p DES to LAD in 2009 and more recently in 04/2020, ischemic cardiomyopathy/chronic systolic CHF with EF as low as 25-30% in 04/2020 but improved to 40-45% on Echo in 08/2020, non-sustained VT, hyperlipidemia, and prior methamphetamine abuse (quit in 2012) who was admitted on 01/13/2021 with epigastric pain. He described the pain as epigastric/lower subxiphoid pain. It is reproducible with palpation, cough, or deep inspiration. He reported associated nausea but no vomiting. Patient went to work on day of presentation and noticed worsening pain when carrying a heavy load. He eventually went to the [facility]. Pain eventually resolved after being placed on a Nitro patch.  EKG showed no acute ischemic changes. High-sensitivity troponin negative x3.Lipase elevated at 88. LFTs normal. CBC normal. Patient had recurrent symptoms in the ED and was ultimately transferred to [facility] for further evaluation.   Hospital Course     Consultants: None.  Epigastric Pain Patient admitted for further evaluation of epigastric pain as stated above. High-sensitivity troponin negative x5. Lipase at the [facility] ED was elevated at 88. Abdominal/pelvic CT showed multiple low-density hepatic lesions consistent with small cysts but no acute findings to explain epigastric pain.  Given no acute abdominal etiology, patient underwent Lexiscan Myoview which showed decreased tracer activity in the inferior wall, distal inferior septal, and apical walls overall consistent with scar and possible soft tissue attenuation but no significant ischemia. Therefore, patient okay for discharge.  Symptoms not felt to be an anginal equivalent. Will start  Protonix  40mg  daily to see if that helps. Advised patient to follow-up with PCP for further evaluation of abdominal pain.  CAD  History of STEMI in 04/2020 s/p DES to LAD. No chest pain this admission. Epigastric pain not felt to be an anginal equivalent. Continue DAPT with Aspirin and Plavix. Continue beta-blocker and high-intensity statin.  Ischemic Cardiomyopathy LVEF as low as 25-30% at the time of his MI in 04/2020 but improved to 40-45% on last Echo in 08/2020. Patient euvolemic this admission. Currently on Losartan 25mg  daily, Coreg 3.125mg  twice daily, Spironolactone 25mg  daily, Digoxin 0.125mg  daily, and Farxiga 10mg  daily. Per last note with Dr. 10/2020 in 10/2020, plan was to stop Digoxin and stop Losartan and start Entresto 24-26mg  twice daily instead. However, looks like these changes did not happen and patient is still taking Digoxin and Losartan. Patient has a follow-up visit with Dr. early next month. Discussed with Dr. - will continue home medication at discharge and defer changes to Dr. Shirlee Latch.  Hyperlipidemia Lipid panel this admission: Total Cholesterol 104, Triglycerides 94, HDL 29, LDL 56. Continue Lipitor 80mg  daily.  PAD  Abdominal/pelvic CT this admission showed CT did showed moderate ostial stenosis of the celiac trunk with sharp upward angulation which can be seen with mediate arcuate ligament syndrome but patent SMA and IMA as well as occlusion of the left common, left exertional, and left internal iliac arteries which was felt to be chronic with reconstitution distally by collateral vessels. Patient denies any claudication. Continue antiplatelet therapy and statin therapy as above. Recommend checking lower extremity dopplers/ABIs at outpatient follow-up.  Patient seen and examined by Dr. 11/2020 today and determined to be stable for discharge. Outpatient follow-up has been arranged. Medications as below.   Did the patient have an acute coronary syndrome (MI,  NSTEMI, STEMI, etc) this admission?:  No                               Did the patient have a percutaneous coronary intervention (stent / angioplasty)?:  No.   _____________  Discharge Vitals Blood pressure 113/75, pulse 63, temperature (!) 97.5 F (36.4 C), temperature source Oral, resp. rate 18, height 5\' 11"  (1.803 m), weight 71.1 kg, SpO2 100 %.  Filed Weights   01/13/21 1236  Weight: 71.1 kg    Labs & Radiologic Studies    CBC Recent Labs    01/13/21 1348  WBC 6.9  HGB 13.9  HCT 42.0  MCV 89.4  PLT 284   Basic Metabolic Panel Recent Labs    Shirlee Latch 1348 01/14/21 0153  NA  --  137  K  --  3.8  CL  --  107  CO2  --  22  GLUCOSE  --  111*  BUN  --  13  CREATININE 0.91 0.89  CALCIUM  --  8.8*   Liver Function Tests No results for input(s): AST, ALT, ALKPHOS, BILITOT, PROT, ALBUMIN in the last 72 hours. No results for input(s): LIPASE, AMYLASE in the last 72 hours. High Sensitivity Troponin:   Recent Labs  Lab 01/13/21 1348 01/13/21 1519  TROPONINIHS 4 3    BNP Invalid input(s): POCBNP D-Dimer No results for input(s): DDIMER in the last 72 hours. Hemoglobin A1C No results for input(s): HGBA1C in the last 72 hours. Fasting Lipid Panel Recent Labs    01/14/21 0153  CHOL 104  HDL 29*  LDLCALC 56  TRIG 94  CHOLHDL 3.6  Thyroid Function Tests No results for input(s): TSH, T4TOTAL, T3FREE, THYROIDAB in the last 72 hours.  Invalid input(s): FREET3 _____________  CT ABDOMEN PELVIS W CONTRAST  Result Date: 01/13/2021 CLINICAL DATA:  Epigastric pain EXAM: CT ABDOMEN AND PELVIS WITH CONTRAST TECHNIQUE: Multidetector CT imaging of the abdomen and pelvis was performed using the standard protocol following bolus administration of intravenous contrast. CONTRAST:  OMNIPAQUE IOHEXOL 300 MG/ML  SOLN COMPARISON:  None. FINDINGS: Lower chest: Unchanged small right middle lobe pulmonary nodule as seen on prior chest CT in August. There is subendocardial  hypoattenuation apex and apical septum of the heart, LAD territory (series 3, images 1-6). Per medical record patient had a prior LAD territory ischemic event. On prior noncontrast CT of the chest, the subendocardial apical septum is also hypoattenuating, but to a much lesser degree in the apex. There is inferior apical wall myocardial thinning suggestive of chronic infarct. Hepatobiliary: There are multiple low-density hepatic lesions which consistent with small cysts. No gallstones, gallbladder wall thickening, or biliary dilatation. Pancreas: Unremarkable. No pancreatic ductal dilatation or surrounding inflammatory changes. Spleen: Normal in size without focal abnormality. Adrenals/Urinary Tract: Adrenal glands are unremarkable. No hydronephrosis or nephrolithiasis. Bladder is unremarkable. Stomach/Bowel: The stomach is within normal limits. There is no evidence of bowel obstruction. The appendix is normal. Vascular/Lymphatic: Aortoiliac atherosclerotic calcifications. No AAA. No lymphadenopathy. There is long segment occlusion of the left common, external iliac, and internal iliac arteries. There is reconstitution distally collateral vessels leading into the common femoral artery which is patent. The partially visualized proximal SFA and deep profunda arteries are patent. There is moderate ostial stenosis of the celiac trunk with sharp upper angulation (series 7, image 66). Patent SMA and IMA. Reproductive: Unremarkable. Other: No abdominal wall hernia or abnormality. No abdominopelvic ascites. Musculoskeletal: No acute osseous abnormality. No suspicious lytic or blastic lesions multilevel degenerative changes of the spine. Trace retrolisthesis at L2-L3 and L5-S1. IMPRESSION: Partially visualized subendocardial hypoattenuation within the LAD territory, to a greater extent in comparison to noncontrast chest CT in August. Apical inferior myocardial thinning compatible with old infarct. Findings could potentially  represent peri-infarct ischemia in this patient with prior reported LAD territory infarct. Recommend continued serial troponins and EKGs as recommended by cardiology. No acute abdominopelvic abnormality. Atherosclerotic disease of the aorta and branch vessels. 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 This angulation can be seen with median arcuate ligament syndrome in the appropriate clinical setting. Patent SMA and IMA. 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 Reconstitution distally by collateral vessels leading into the common femoral artery which is patent. The partially visualized proximal SFA and deep profunda arteries are patent. These results were called by telephone at the time of interpretation on 01/13/2021 at 4:38 pm to provider Dr. Diona Browner (Cardiology), who verbally acknowledged these results. Electronically Signed   By: Caprice Renshaw M.D.   On: 01/13/2021 16:39   NM Myocar Multi W/Spect Izetta Dakin Motion / EF  Result Date: 01/14/2021   Lexiscan stress is electrically negative for ischemia .   Nuclear  images show decreased tracer activity in the inferior wall (base, mid, distal), distal inferoseptal and apical walls    In the stress images there is mild improvement in the mid region, not significant for ischemia Overall consistent with scar and possible soft tissue attenuation with minimal periinfarct ischemia.   LVEF 47%  with distal inferior and apical akinesis   Intermediate  risk scan   Compared to previous scan inferior changes more prominent, LVEF relatively unchanged.   Disposition   Patient is being discharged home today in good condition.  Follow-up Plans & Appointments     Follow-up Information     Laurey Morale, MD Follow up.   Specialty: Cardiology Why: Please keep follow-up visit with Dr. Shirlee Latch which is scheduled for 02/01/2021 at 9:00am. Contact information: [address] [place] [address] [phone]         Jonelle Sidle, MD Follow up.   Specialty: Cardiology Why: Hospital follow-up with Cardiology scheduled for 03/07/2020 at 9:40am. Please arrive 15 minutes early for check-in. If this date/time does not work for you, please call our office to reschedule. Contact information: [address] [place] [address] [phone]                Discharge Instructions     Diet - low sodium heart healthy   Complete by: As directed    Increase activity slowly   Complete by: As directed        Discharge Medications   Allergies as of 01/14/2021   No Known Allergies      Medication List     TAKE these medications    albuterol 108 (90 Base) MCG/ACT inhaler Commonly known as: VENTOLIN HFA Inhale 2 puffs into the lungs every 4 (four) hours as needed for wheezing or shortness of breath.   aspirin EC 81 MG tablet Take 1 tablet (81 mg total) by mouth daily.   atorvastatin 80 MG tablet Commonly known as: LIPITOR Take 1 tablet (80 mg total) by mouth daily.   buPROPion 150 MG 12 hr tablet Commonly known as: WELLBUTRIN SR Take 1 tablet by mouth twice daily   carvedilol 3.125 MG tablet Commonly known as: COREG Take 1 tablet (3.125 mg total) by mouth 2 (two) times daily with a meal.   clopidogrel 75 MG tablet Commonly known as: PLAVIX Take 1 tablet (75 mg total) by mouth daily.   dapagliflozin propanediol 10 MG Tabs tablet Commonly known as: FARXIGA Take 1 tablet (10 mg total) by mouth daily.   digoxin 0.125 MG tablet Commonly known as: LANOXIN Take 125 mcg by mouth daily.   fluticasone 50 MCG/ACT nasal spray Commonly known as: FLONASE Place 1-2 sprays into both nostrils daily as needed for allergies.   losartan 25 MG tablet Commonly known as: COZAAR Take 25 mg by mouth daily.   nitroGLYCERIN 0.4 MG SL tablet Commonly known as: Nitrostat Place 1 tablet (0.4 mg total) under the tongue every 5 (five)  minutes as needed.   pantoprazole 40 MG tablet Commonly known as: Protonix Take 1 tablet (40 mg total) by mouth daily.   spironolactone 25 MG tablet Commonly known as: ALDACTONE Take 1 tablet (25 mg total) by mouth daily.           Outstanding Labs/Studies   N/A.  Duration of Discharge Encounter   Greater than 30 minutes including physician time.  Signed, Corrin Parker, PA-C 01/14/2021, 4:43 PM

## 2021-01-14 NOTE — Progress Notes (Signed)
Primary Cardiologist:  Diona Browner in [place] office  Subjective:  Still with pain under ribs   Objective:  Vitals:   01/13/21 1801 01/13/21 2000 01/14/21 0021 01/14/21 0321  BP: (!) 103/53 (!) 100/55 96/67 115/65  Pulse: 73 (!) 59 66 65  Resp:  18 16 18   Temp:  98.1 F (36.7 C) 97.6 F (36.4 C) (!) 97.5 F (36.4 C)  TempSrc:  Oral Oral Oral  SpO2:  100%    Weight:      Height:        Intake/Output from previous day: No intake or output data in the 24 hours ending 01/14/21 0830  Physical Exam: Affect appropriate Healthy:  appears stated age HEENT: normal Neck supple with no adenopathy JVP normal no bruits no thyromegaly Lungs clear with no wheezing and good diaphragmatic motion Heart:  S1/S2 no murmur, no rub, gallop or click PMI normal Abdomen: benighn, BS positve, no tenderness, no AAA no bruit.  No HSM or HJR Distal pulses intact with no bruits No edema Neuro non-focal Skin warm and dry No muscular weakness   Lab Results: Basic Metabolic Panel: Recent Labs    01/13/21 1348 01/14/21 0153  NA  --  137  K  --  3.8  CL  --  107  CO2  --  22  GLUCOSE  --  111*  BUN  --  13  CREATININE 0.91 0.89  CALCIUM  --  8.8*   Liver Function Tests: No results for input(s): AST, ALT, ALKPHOS, BILITOT, PROT, ALBUMIN in the last 72 hours. No results for input(s): LIPASE, AMYLASE in the last 72 hours. CBC: Recent Labs    01/13/21 1348  WBC 6.9  HGB 13.9  HCT 42.0  MCV 89.4  PLT 284   Cardiac Enzymes: No results for input(s): CKTOTAL, CKMB, CKMBINDEX, TROPONINI in the last 72 hours. BNP: Invalid input(s): POCBNP D-Dimer: No results for input(s): DDIMER in the last 72 hours. Hemoglobin A1C: No results for input(s): HGBA1C in the last 72 hours. Fasting Lipid Panel: Recent Labs    01/14/21 0153  CHOL 104  HDL 29*  LDLCALC 56  TRIG 94  CHOLHDL 3.6     Imaging: CT ABDOMEN PELVIS W CONTRAST  Result Date: 01/13/2021 CLINICAL DATA:  Epigastric pain  EXAM: CT ABDOMEN AND PELVIS WITH CONTRAST TECHNIQUE: Multidetector CT imaging of the abdomen and pelvis was performed using the standard protocol following bolus administration of intravenous contrast. CONTRAST:  01/15/2021 OMNIPAQUE IOHEXOL 300 MG/ML  SOLN COMPARISON:  None. FINDINGS: Lower chest: Unchanged small right middle lobe pulmonary nodule as seen on prior chest CT in August. There is subendocardial hypoattenuation apex and apical septum of the heart, LAD territory (series 3, images 1-6). Per medical record patient had a prior LAD territory ischemic event. On prior noncontrast CT of the chest, the subendocardial apical septum is also hypoattenuating, but to a much lesser degree in the apex. There is inferior apical wall myocardial thinning suggestive of chronic infarct. Hepatobiliary: There are multiple low-density hepatic lesions which consistent with small cysts. No gallstones, gallbladder wall thickening, or biliary dilatation. Pancreas: Unremarkable. No pancreatic ductal dilatation or surrounding inflammatory changes. Spleen: Normal in size without focal abnormality. Adrenals/Urinary Tract: Adrenal glands are unremarkable. No hydronephrosis or nephrolithiasis. Bladder is unremarkable. Stomach/Bowel: The stomach is within normal limits. There is no evidence of bowel obstruction. The appendix is normal. Vascular/Lymphatic: Aortoiliac atherosclerotic calcifications. No AAA. No lymphadenopathy. There is long segment occlusion of the left common, external iliac, and  internal iliac arteries. There is reconstitution distally collateral vessels leading into the common femoral artery which is patent. The partially visualized proximal SFA and deep profunda arteries are patent. There is moderate ostial stenosis of the celiac trunk with sharp upper angulation (series 7, image 66). Patent SMA and IMA. Reproductive: Unremarkable. Other: No abdominal wall hernia or abnormality. No abdominopelvic ascites. Musculoskeletal:  No acute osseous abnormality. No suspicious lytic or blastic lesions multilevel degenerative changes of the spine. Trace retrolisthesis at L2-L3 and L5-S1. IMPRESSION: Partially visualized subendocardial hypoattenuation within the LAD territory, to a greater extent in comparison to noncontrast chest CT in August. Apical inferior myocardial thinning compatible with old infarct. Findings could potentially represent peri-infarct ischemia in this patient with prior reported LAD territory infarct. Recommend continued serial troponins and EKGs as recommended by cardiology. No acute abdominopelvic abnormality. Atherosclerotic disease of the aorta and branch vessels. 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 This angulation can be seen with median arcuate ligament syndrome in the appropriate clinical setting. Patent SMA and IMA. 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 Reconstitution distally by collateral vessels leading into the common femoral artery which is patent. The partially visualized proximal SFA and deep profunda arteries are patent. These results were called by telephone at the time of interpretation on 01/13/2021 at 4:38 pm to provider Dr. Diona Browner (Cardiology), who verbally acknowledged these results. Electronically Signed   By: Caprice Renshaw M.D.   On: 01/13/2021 16:39    Cardiac Studies:  ECG: SR old anterolateral MI 01/13/21   Telemetry: NSR 01/14/2021   Echo: EF 40-45% no significant vavle dx 09/11/20   Medications:    aspirin EC  81 mg Oral Daily   atorvastatin  80 mg Oral Daily   buPROPion  150 mg Oral BID   carvedilol  3.125 mg Oral BID WC   clopidogrel  75 mg Oral Daily   dapagliflozin propanediol  10 mg Oral Daily   enoxaparin (LOVENOX) injection  40 mg Subcutaneous Q24H   spironolactone  25 mg Oral Daily      Assessment/Plan:  [name] is a 55 y.o. male with past medical  history of CAD with DES to LAD in 2009 and 04/2020, history of methamphetamine abuse quit in 2012, ischemic cardiomyopathy, tobacco abuse, and hyperlipidemia who is being seen 01/13/2021 for the evaluation of epigastric pain. He was transferred to [facility] from [facility]  Epigastric Pain:  doubt anginal equivalent. For myovue today per SM.  CT abdomen nothing acute Lipase minimally elevated in [place] Pancrease ok on CT will add Protonix CAD DES to LAD 04/2020 continue ASA/Plavix No acute ECG changes and troponin negative Cath 04/22/20 no other dx other than 100% mid LAD with nice result from stent  HLD:  continue lipitor Ischemic DCM:  no volume overload continue coreg and aldactone ? Not on ACE/ARNI due to soft BP  DM:  Discussed low carb diet.  Target hemoglobin A1c is 6.5 or less.  Continue current medications.  Possible d/c if myovue low risk   Charlton Haws 01/14/2021, 8:30 AM

## 2021-01-14 NOTE — Plan of Care (Signed)

## 2021-01-14 NOTE — Progress Notes (Signed)
   Patient presented for a nuclear stress test today. No immediate complications. Stress imaging is pending at this time.   Preliminary EKG findings may be listed in the chart, but the stress test result will not be finalized until perfusion imaging is complete.  Corrin Parker, PA-C 01/14/2021 1:02 PM

## 2021-01-14 NOTE — Plan of Care (Signed)
  Problem: Education: Goal: Knowledge of General Education information will improve Description: Including pain rating scale, medication(s)/side effects and non-pharmacologic comfort measures 01/14/2021 1119 by Mamie Nick I, RN Outcome: Progressing 01/14/2021 1118 by Mamie Nick I, RN Outcome: Progressing   Problem: Health Behavior/Discharge Planning: Goal: Ability to manage health-related needs will improve 01/14/2021 1119 by Mamie Nick I, RN Outcome: Progressing 01/14/2021 1118 by Mamie Nick I, RN Outcome: Progressing   Problem: Clinical Measurements: Goal: Ability to maintain clinical measurements within normal limits will improve 01/14/2021 1119 by Mamie Nick I, RN Outcome: Progressing 01/14/2021 1118 by Mamie Nick I, RN Outcome: Progressing Goal: Will remain free from infection 01/14/2021 1119 by Mamie Nick I, RN Outcome: Progressing 01/14/2021 1118 by Mamie Nick I, RN Outcome: Progressing Goal: Diagnostic test results will improve 01/14/2021 1119 by Mamie Nick I, RN Outcome: Progressing 01/14/2021 1118 by Mamie Nick I, RN Outcome: Progressing Goal: Respiratory complications will improve 01/14/2021 1119 by Mamie Nick I, RN Outcome: Progressing 01/14/2021 1118 by Mamie Nick I, RN Outcome: Progressing Goal: Cardiovascular complication will be avoided 01/14/2021 1119 by Mamie Nick I, RN Outcome: Progressing 01/14/2021 1118 by Mamie Nick I, RN Outcome: Progressing   Problem: Activity: Goal: Risk for activity intolerance will decrease 01/14/2021 1119 by Mamie Nick I, RN Outcome: Progressing 01/14/2021 1118 by Mamie Nick I, RN Outcome: Progressing   Problem: Nutrition: Goal: Adequate nutrition will be maintained 01/14/2021 1119 by Mamie Nick I, RN Outcome: Progressing 01/14/2021 1118 by Mamie Nick I, RN Outcome: Progressing   Problem:  Coping: Goal: Level of anxiety will decrease 01/14/2021 1119 by Mamie Nick I, RN Outcome: Progressing 01/14/2021 1118 by Mamie Nick I, RN Outcome: Progressing   Problem: Elimination: Goal: Will not experience complications related to bowel motility 01/14/2021 1119 by Mamie Nick I, RN Outcome: Progressing 01/14/2021 1118 by Mamie Nick I, RN Outcome: Progressing Goal: Will not experience complications related to urinary retention 01/14/2021 1119 by Mamie Nick I, RN Outcome: Progressing 01/14/2021 1118 by Mamie Nick I, RN Outcome: Progressing   Problem: Pain Managment: Goal: General experience of comfort will improve 01/14/2021 1119 by Mamie Nick I, RN Outcome: Progressing 01/14/2021 1118 by Mamie Nick I, RN Outcome: Progressing   Problem: Safety: Goal: Ability to remain free from injury will improve 01/14/2021 1119 by Mamie Nick I, RN Outcome: Progressing 01/14/2021 1118 by Mamie Nick I, RN Outcome: Progressing   Problem: Skin Integrity: Goal: Risk for impaired skin integrity will decrease 01/14/2021 1119 by Mamie Nick I, RN Outcome: Progressing 01/14/2021 1118 by Mamie Nick I, RN Outcome: Progressing

## 2021-02-01 ENCOUNTER — Ambulatory Visit (HOSPITAL_COMMUNITY)
Admission: RE | Admit: 2021-02-01 | Discharge: 2021-02-01 | Disposition: A | Discharge status: Home / Self Care | Payer: BC Managed Care – PPO | Source: Ambulatory Visit | Attending: Cardiology | Admitting: Cardiology

## 2021-02-01 ENCOUNTER — Other Ambulatory Visit: Payer: Self-pay

## 2021-02-01 VITALS — BP 115/62 | HR 81 | Wt 160.6 lb

## 2021-02-01 DIAGNOSIS — F1511 Other stimulant abuse, in remission: Secondary | ICD-10-CM | POA: Diagnosis not present

## 2021-02-01 DIAGNOSIS — F17210 Nicotine dependence, cigarettes, uncomplicated: Secondary | ICD-10-CM | POA: Insufficient documentation

## 2021-02-01 DIAGNOSIS — Z596 Low income: Secondary | ICD-10-CM | POA: Insufficient documentation

## 2021-02-01 DIAGNOSIS — Z7982 Long term (current) use of aspirin: Secondary | ICD-10-CM | POA: Insufficient documentation

## 2021-02-01 DIAGNOSIS — Z7984 Long term (current) use of oral hypoglycemic drugs: Secondary | ICD-10-CM | POA: Diagnosis not present

## 2021-02-01 DIAGNOSIS — I5022 Chronic systolic (congestive) heart failure: Secondary | ICD-10-CM

## 2021-02-01 DIAGNOSIS — Z7902 Long term (current) use of antithrombotics/antiplatelets: Secondary | ICD-10-CM | POA: Insufficient documentation

## 2021-02-01 DIAGNOSIS — Z79899 Other long term (current) drug therapy: Secondary | ICD-10-CM | POA: Diagnosis not present

## 2021-02-01 DIAGNOSIS — Z5941 Food insecurity: Secondary | ICD-10-CM | POA: Diagnosis not present

## 2021-02-01 DIAGNOSIS — I252 Old myocardial infarction: Secondary | ICD-10-CM | POA: Insufficient documentation

## 2021-02-01 DIAGNOSIS — I2510 Atherosclerotic heart disease of native coronary artery without angina pectoris: Secondary | ICD-10-CM | POA: Insufficient documentation

## 2021-02-01 DIAGNOSIS — Z955 Presence of coronary angioplasty implant and graft: Secondary | ICD-10-CM | POA: Diagnosis not present

## 2021-02-01 MED ORDER — ENTRESTO 24-26 MG PO TABS
1.0000 | ORAL_TABLET | Freq: Two times a day (BID) | ORAL | 6 refills | Status: DC
Start: 2021-02-01 — End: 2021-08-29

## 2021-02-01 NOTE — Patient Instructions (Signed)
Medication Changes:  Stop Digoxin  Lab Work:  BMET in 10 days Can be done at [facility]  Testing/Procedures:  none  Referrals:  none  Special Instructions // Education:  none  Follow-Up in: 6 months (June 2023)  ** Call in April for an appointment**  At the [facility], you and your health needs are our priority. We have a designated team specialized in the treatment of Heart Failure. This Care Team includes your primary Heart Failure Specialized Cardiologist (physician), Advanced Practice Providers (APPs- Physician Assistants and Nurse Practitioners), and Pharmacist who all work together to provide you with the care you need, when you need it.   You may see any of the following providers on your designated Care Team at your next follow up:  Dr Arvilla Meres Dr Carron Curie, NP Robbie Lis, [place] [facility] [place], [place] Karle Plumber, PharmD   Please be sure to bring in all your medications bottles to every appointment.   Need to Contact us:  If you have any questions or concerns before your next appointment please send [place] a message through [place] or call our office at [phone].    TO LEAVE A MESSAGE FOR THE NURSE SELECT OPTION 2, PLEASE LEAVE A MESSAGE INCLUDING: YOUR NAME DATE OF BIRTH CALL BACK NUMBER REASON FOR CALL**this is important as we prioritize the call backs  YOU WILL RECEIVE A CALL BACK THE SAME DAY AS LONG AS YOU CALL BEFORE 4:00 PM

## 2021-02-03 NOTE — Progress Notes (Signed)
[facility] CONSULT NOTE  PCP: Default, Provider, MD Cardiology: Dr. Shirlee Latch  HPI: [name] is a 55 y.o. with a history of CAD, DES to LAD 2009 and 04/2020 LAD, methamphetamine abuse quit in 2012, chronic systolic heart failure, and tobacco abuse.   Admitted 04/22/20 with chest pain--> anterolatelal STEMI. Taken for cath and had total occlusion mid LAD with subsequent PCI to LAD. Echo in 2/22 showed EF 25-30%. Discharged on 04/25/20 with [facility].  Repeat echo in 7/22 showed EF up to 40-45% with normal RV.    Patient was admitted in 11/22 with atypical chest/epigastric pain.  Troponin was negative.  He had Cardiolite showing EF 47%, inferior scar with no ischemia.  Symptoms improved with Protonix.   Patient returns for followup of CAD and CHF. He is still smoking 4-5 cigarettes/day.  No dyspnea with usual activities.  No further chest pain.  Works in Production designer, theatre/television/film at [employer].  Recently finished cardiac rehab.   Labs (3/22): K 5.1, creatinine 0.97 Labs (5/22): K 4.3, creatinine 0.87, LDL 45, TGs 221, BNP 30, digoxin 0.5 Labs (11/22): LDL 56, K 3.8, creatinine 6.54  PMH: 1. Active smoker with suspected COPD.  PFTs showed obstruction at the level of the throat, ENT appt recommended.  2. CAD: 2009 PCI to mid LAD.  - 2/22 anterolateral STEMI: mLAD occluded just beyond prior stent, treated with PCI to mLAD.  - Cardiolite (11/22): EF 47%, inferior scar with no ischemia.  3. Chronic systolic CHF: Ischemic cardiomyopathy.  Echo (2/22) with EF 25-30% with LAD-territory WMAs, normal RV.  - Echo (7/22): EF 40-45%, normal RV.  4. H/o methamphetamine use, quit 2012.  5. Hyperlipidemia  ROS: All systems reviewed and negative except as per HPI.    Current Outpatient Medications  Medication Sig Dispense Refill   albuterol (VENTOLIN HFA) 108 (90 Base) MCG/ACT inhaler Inhale 2 puffs into the lungs every 4 (four) hours as needed for wheezing or shortness of breath.     aspirin EC 81 MG tablet  Take 1 tablet (81 mg total) by mouth daily. 90 tablet 3   atorvastatin (LIPITOR) 80 MG tablet Take 1 tablet (80 mg total) by mouth daily. 90 tablet 3   buPROPion (WELLBUTRIN SR) 150 MG 12 hr tablet Take 1 tablet by mouth twice daily 60 tablet 5   carvedilol (COREG) 3.125 MG tablet Take 1 tablet (3.125 mg total) by mouth 2 (two) times daily with a meal. 180 tablet 3   clopidogrel (PLAVIX) 75 MG tablet Take 1 tablet (75 mg total) by mouth daily. 90 tablet 3   dapagliflozin propanediol (FARXIGA) 10 MG TABS tablet Take 1 tablet (10 mg total) by mouth daily. 90 tablet 3   fluticasone (FLONASE) 50 MCG/ACT nasal spray Place 1-2 sprays into both nostrils daily as needed for allergies.     nitroGLYCERIN (NITROSTAT) 0.4 MG SL tablet Place 1 tablet (0.4 mg total) under the tongue every 5 (five) minutes as needed. 25 tablet 3   pantoprazole (PROTONIX) 40 MG tablet Take 1 tablet (40 mg total) by mouth daily. 30 tablet 1   sacubitril-valsartan (ENTRESTO) 24-26 MG Take 1 tablet by mouth 2 (two) times daily. 60 tablet 6   spironolactone (ALDACTONE) 25 MG tablet Take 1 tablet (25 mg total) by mouth daily. 90 tablet 3   No current facility-administered medications for this encounter.    No Known Allergies    Social History   Socioeconomic History   Marital status: Married    Spouse name: Lupita Leash  [place]   Number of children: Not on file   Years of education: Not on file   Highest education level: Not on file  Occupational History   Not on file  Tobacco Use   Smoking status: Light Smoker    Packs/day: 0.50    Years: 51.00    Pack years: 25.50    Types: Cigarettes   Smokeless tobacco: Never   Tobacco comments:    Currently 6-8 cigs per day  Vaping Use   Vaping Use: Never used  Substance and Sexual Activity   Alcohol use: No   Drug use: Not Currently    Types: Methamphetamines    Comment: previously used methamphetamines - quit 09/2010   Sexual activity: Yes    Partners: Female    Birth  control/protection: None  Other Topics Concern   Not on file  Social History Narrative   Lives in [place].  Step-dtr near-by.  Works for a [employer].   Social Determinants of Health   Financial Resource Strain: High Risk   Difficulty of Paying Living Expenses: Very hard  Food Insecurity: Food Insecurity Present   Worried About Charity fundraiser in the Last Year: Sometimes true   Ran Out of Food in the Last Year: Sometimes true  Transportation Needs: No Transportation Needs   Lack of Transportation (Medical): No   Lack of Transportation (Non-Medical): No  Physical Activity: Insufficiently Active   Days of Exercise per Week: 3 days   Minutes of Exercise per Session: 20 min  Stress: Not on file  Social Connections: Not on file  Intimate Partner Violence: Not At Risk   Fear of Current or Ex-Partner: No   Emotionally Abused: No   Physically Abused: No   Sexually Abused: No      Family History  Problem Relation Age of Onset   Heart attack Father        died @ 60   Coronary artery disease Mother        alive in late 19's    Vitals:   02/01/21 0852  BP: 115/62  Pulse: 81  SpO2: 98%  Weight: 72.8 kg (160 lb 9.6 oz)    PHYSICAL EXAM: General: NAD Neck: No JVD, no thyromegaly or thyroid nodule.  Lungs: Clear to auscultation bilaterally with normal respiratory effort. CV: Nondisplaced PMI.  Heart regular S1/S2, no S3/S4, no murmur.  No peripheral edema.  No carotid bruit.  Normal pedal pulses.  Abdomen: Soft, nontender, no hepatosplenomegaly, no distention.  Skin: Intact without lesions or rashes.  Neurologic: Alert and oriented x 3.  Psych: Normal affect. Extremities: No clubbing or cyanosis.  HEENT: Normal.   ASSESSMENT & PLAN:  1. Chronic systolic CHF: Ischemic cardiomyopathy. Echo 04/2020 with EF 25-30% RV normal.  Repeat echo in 7/22 showed EF up to 40-45%.  NYHA class I-II symptoms, not volume overloaded on exam.  - Continue Coreg 3.125 mg twice a day  - He  can stop digoxin with improved EF.  - Stop losartan today, start Entresto 24/26 bid (was supposed to do this at last appt but did not, will check with pharmacist to make sure he can get this medication).  BMET today and again in 10 days.   - Continue spironolactone 25 mg daily.    - Continue Farxiga 10 mg daily  - EF is out of ICD range.  2. CAD: DES to mLAD in 2009.  Anterolateral STEMI in 2/22 with DES to mLAD.  No concerning chest pain. -  Continue ASA 81 and Plavix 75 daily.  - Continue atorvastatin 80 daily.  3. Tobacco Abuse: Discussed smoking cessation. He is trying to cut back, Wellbutrin is helping.   - Continue Wellbutrin.   Followup in 6 months with APP.   Loralie Champagne 02/03/2021

## 2021-02-14 ENCOUNTER — Ambulatory Visit (HOSPITAL_COMMUNITY)
Admission: RE | Admit: 2021-02-14 | Discharge: 2021-02-14 | Disposition: A | Discharge status: Home / Self Care | Payer: BC Managed Care – PPO | Source: Ambulatory Visit | Attending: Acute Care | Admitting: Acute Care

## 2021-02-14 ENCOUNTER — Other Ambulatory Visit: Payer: Self-pay

## 2021-02-14 DIAGNOSIS — Z87891 Personal history of nicotine dependence: Secondary | ICD-10-CM

## 2021-02-14 DIAGNOSIS — J439 Emphysema, unspecified: Secondary | ICD-10-CM | POA: Diagnosis not present

## 2021-02-14 DIAGNOSIS — R911 Solitary pulmonary nodule: Secondary | ICD-10-CM | POA: Insufficient documentation

## 2021-02-14 DIAGNOSIS — I700 Atherosclerosis of aorta: Secondary | ICD-10-CM | POA: Diagnosis not present

## 2021-02-14 DIAGNOSIS — F17210 Nicotine dependence, cigarettes, uncomplicated: Secondary | ICD-10-CM | POA: Diagnosis not present

## 2021-02-21 ENCOUNTER — Other Ambulatory Visit: Payer: Self-pay | Admitting: Acute Care

## 2021-02-21 DIAGNOSIS — F17210 Nicotine dependence, cigarettes, uncomplicated: Secondary | ICD-10-CM

## 2021-02-21 DIAGNOSIS — Z87891 Personal history of nicotine dependence: Secondary | ICD-10-CM

## 2021-03-06 NOTE — Progress Notes (Signed)
Cardiology Office Note  Date: 03/07/2021   ID: [name], DOB 03/13/1965, MRN [id number]  PCP:  Default, Provider, MD  Cardiologist:  Nona DellSamuel Jahvon Gosline, MD Electrophysiologist:  None   Chief Complaint  Patient presents with   Cardiac follow-up    History of Present Illness: [name] is a 56 y.o. male last seen by Dr. Shirlee LatchMcLean in the heart failure clinic in December 2022, now presenting for a routine visit.  This is our first meeting in the office.  I reviewed his history and recent follow-up notes.  He is being treated for an ischemic cardiomyopathy, medication adjustments made at his last visit in December 2022.  He does not report any active angina at this time or nitroglycerin use.  Currently with NYHA class II dyspnea with typical activities.  He does not describe any major weight change other than fluctuations by only a few pounds.  He is down 2 pounds by our scales today.  No orthopnea or PND, no leg swelling.  Echocardiogram from July 2022 revealed LVEF 40 to 45% representing an improvement from 25 to 30%.  We went over his current regimen, he reports compliance with therapy.  Past Medical History:  Diagnosis Date   Coronary arteriosclerosis    a. PCI LAD 2009 in [place];  b. 12/2010 Cath: LM: nl, LAD: patent stent mid, 25 mid, LCX nl, RCA: non dominant, nl, EF 50%   History of methamphetamine abuse (HCC)    a. Used x 6 yrs, quit July 2012   History of tobacco abuse    a. 41 yrs, up to 3 ppd, quit July 2012   Hypercholesterolemia     Past Surgical History:  Procedure Laterality Date   BACK SURGERY     fusion-lumbar   CORONARY ANGIOPLASTY WITH STENT PLACEMENT     CORONARY/GRAFT ACUTE MI REVASCULARIZATION N/A 04/22/2020   Procedure: Coronary/Graft Acute MI Revascularization;  Surgeon: Lennette BihariKelly, Thomas A, MD;  Location: [facility];  Service: Cardiovascular;  Laterality: N/A;   LEFT HEART CATH AND CORONARY ANGIOGRAPHY N/A 04/22/2020   Procedure: LEFT HEART CATH  AND CORONARY ANGIOGRAPHY;  Surgeon: Lennette BihariKelly, Thomas A, MD;  Location: [facility] CV [facility];  Service: Cardiovascular;  Laterality: N/A;    Current Outpatient Medications  Medication Sig Dispense Refill   aspirin EC 81 MG tablet Take 1 tablet (81 mg total) by mouth daily. 90 tablet 3   atorvastatin (LIPITOR) 80 MG tablet Take 1 tablet (80 mg total) by mouth daily. 90 tablet 3   buPROPion (WELLBUTRIN SR) 150 MG 12 hr tablet Take 1 tablet by mouth twice daily 60 tablet 5   carvedilol (COREG) 3.125 MG tablet Take 1 tablet (3.125 mg total) by mouth 2 (two) times daily with a meal. 180 tablet 3   clopidogrel (PLAVIX) 75 MG tablet Take 1 tablet (75 mg total) by mouth daily. 90 tablet 3   dapagliflozin propanediol (FARXIGA) 10 MG TABS tablet Take 1 tablet (10 mg total) by mouth daily. 90 tablet 3   fluticasone (FLONASE) 50 MCG/ACT nasal spray Place 1-2 sprays into both nostrils daily as needed for allergies.     pantoprazole (PROTONIX) 40 MG tablet Take 1 tablet (40 mg total) by mouth daily. 30 tablet 1   sacubitril-valsartan (ENTRESTO) 24-26 MG Take 1 tablet by mouth 2 (two) times daily. 60 tablet 6   spironolactone (ALDACTONE) 25 MG tablet Take 1 tablet (25 mg total) by mouth daily. 90 tablet 3   albuterol (VENTOLIN HFA)  108 (90 Base) MCG/ACT inhaler Inhale 2 puffs into the lungs every 4 (four) hours as needed for wheezing or shortness of breath. 1 each 0   nitroGLYCERIN (NITROSTAT) 0.4 MG SL tablet Place 1 tablet (0.4 mg total) under the tongue every 5 (five) minutes x 3 doses as needed (if no relief after 2nd dose, proceed to ED or call 911). 25 tablet 3   No current facility-administered medications for this visit.   Allergies:  Patient has no known allergies.   ROS: No palpitations or syncope.  Physical Exam: VS:  BP 114/68    Pulse 78    Ht 5\' 11"  (1.803 m)    Wt 158 lb 6.4 oz (71.8 kg)    SpO2 96%    BMI 22.09 kg/m , BMI Body mass index is 22.09 kg/m.  Wt Readings from Last 3 Encounters:   03/07/21 158 lb 6.4 oz (71.8 kg)  02/01/21 160 lb 9.6 oz (72.8 kg)  01/13/21 156 lb 12.8 oz (71.1 kg)    General: Patient appears comfortable at rest. HEENT: Conjunctiva and lids normal, wearing a mask. Neck: Supple, no elevated JVP or carotid bruits, no thyromegaly. Lungs: Clear to auscultation, nonlabored breathing at rest. Cardiac: Regular rate and rhythm, no S3 or significant systolic murmur, no pericardial rub. Extremities: No pitting edema.  ECG:  An ECG dated 01/13/2021 was personally reviewed today and demonstrated:  Sinus rhythm with low voltage and old anterior infarct pattern.  Recent Labwork: 04/22/2020: ALT 27; AST 66 07/27/2020: B Natriuretic Peptide 29.6 01/13/2021: Hemoglobin 13.9; Platelets 284 01/14/2021: BUN 13; Creatinine, Ser 0.89; Potassium 3.8; Sodium 137     Component Value Date/Time   CHOL 104 01/14/2021 0153   TRIG 94 01/14/2021 0153   HDL 29 (L) 01/14/2021 0153   CHOLHDL 3.6 01/14/2021 0153   VLDL 19 01/14/2021 0153   LDLCALC 56 01/14/2021 0153    Other Studies Reviewed Today:  Cardiac catheterization 04/22/2020: 2nd Diag lesion is 20% stenosed. Mid LAD to Dist LAD lesion is 100% stenosed. Post intervention, there is a 0% residual stenosis. Mid LAD lesion is 20% stenosed. A stent was successfully placed. The left ventricular ejection fraction is 25-35% by visual estimate. LV end diastolic pressure is mildly elevated. There is severe left ventricular systolic dysfunction.   Acute anterolateral myocardial infarction secondary to total occlusion of the LAD immediately proximal to the previously placed mid LAD stent with initial TIMI 0 flow.   No significant concomitant CAD with a normal ramus intermediate, a large dominant normal left circumflex vessel, and a normal nondominant RCA.   Successful PCI to the LAD with ultimate insertion of a 3.0 x 26 mm Resolute stent postdilated with taper from 3.2-3.9mm with residual stenosis being reduced to 0%.   There is resumption of brisk TIMI-3 flow.  There is no change in the mild 20% stenosis proximal to the second diagonal vessel and 20% ostial second diagonal stenosis.   Severe LV dysfunction with severe hypocontractility involving the mid, distal anterolateral wall extending around the apex and involving the apical inferior segment with EF estimated at approximately 25 -30% acutely.  LVEDP 19 mm.   RECOMMENDATION: DAPT for minimum of 12 months but recommend longer duration.  Guideline directed medical therapy for LV dysfunction with initiation of carvedilol, with subsequent ARB and potential transition to Entresto, aldosterone blockade and possible SGLT2 inhibition depending upon LV function recovery.  Aggressive lipid-lowering therapy with target LDL less than 70.  Smoking cessation.  Echocardiogram 09/11/2020:  1.  Left ventricular ejection fraction, by estimation, is 40 to 45%. The  left ventricle has mildly decreased function. The left ventricle  demonstrates regional wall motion abnormalities (see scoring  diagram/findings for description). Left ventricular  diastolic parameters are consistent with Grade I diastolic dysfunction  (impaired relaxation).   2. Right ventricular systolic function is normal. The right ventricular  size is normal. There is normal pulmonary artery systolic pressure. The  estimated right ventricular systolic pressure is 19.0 mmHg.   3. The mitral valve is grossly normal. No evidence of mitral valve  regurgitation.   4. The aortic valve is tricuspid. Aortic valve regurgitation is not  visualized.   5. The inferior vena cava is normal in size with greater than 50%  respiratory variability, suggesting right atrial pressure of 3 mmHg.   Comparison(s): Echocardiogram done 04/23/20 showed an EF of 25-30%.   Lexiscan Myoview 01/14/2021:   Lexiscan stress is electrically negative for ischemia .   Nuclear  images show decreased tracer activity in the inferior wall (base,  mid, distal), distal inferoseptal and apical walls    In the stress images there is mild improvement in the mid region, not significant for ischemia Overall consistent with scar and possible soft tissue attenuation with minimal periinfarct ischemia.   LVEF 47%  with distal inferior and apical akinesis   Intermediate risk scan   Compared to previous scan inferior changes more prominent, LVEF relatively unchanged.  Assessment and Plan:  1.  HFrEF with chronic systolic heart failure in the setting of ischemic cardiomyopathy.  LVEF up to the range of 40 to 45% by echocardiogram in July 2022.  Medical therapy has been subsequently adjusted and now includes Aldactone, Coreg, Entresto, and [place].  He has not required other standing or intermittent diuretics and his weight is stable.  Currently with NYHA class II dyspnea.  Plan to continue present medical therapy and repeat echocardiogram for his next visit in 3 months along with BMET.  2.  CAD status post DES to the mid LAD in 2009, more recently anterolateral STEMI in February 2022 status post DES to the mid LAD.  He does not describe any active angina and remains on aspirin, Plavix, and Lipitor.  Medication Adjustments/Labs and Tests Ordered: Current medicines are reviewed at length with the patient today.  Concerns regarding medicines are outlined above.   Tests Ordered: Orders Placed This Encounter  Procedures   Basic metabolic panel   ECHOCARDIOGRAM COMPLETE    Medication Changes: Meds ordered this encounter  Medications   nitroGLYCERIN (NITROSTAT) 0.4 MG SL tablet    Sig: Place 1 tablet (0.4 mg total) under the tongue every 5 (five) minutes x 3 doses as needed (if no relief after 2nd dose, proceed to ED or call 911).    Dispense:  25 tablet    Refill:  3   albuterol (VENTOLIN HFA) 108 (90 Base) MCG/ACT inhaler    Sig: Inhale 2 puffs into the lungs every 4 (four) hours as needed for wheezing or shortness of breath.    Dispense:  1 each     Refill:  0    Disposition:  Follow up  3 months.  Signed, Jonelle Sidle, MD, [facility] 03/07/2021 10:09 AM    [facility] at [facility] [address] [place], [place], [place] [address] Phone: [phone]; Fax: [phone]

## 2021-03-07 ENCOUNTER — Ambulatory Visit (INDEPENDENT_AMBULATORY_CARE_PROVIDER_SITE_OTHER): Payer: BC Managed Care – PPO | Admitting: Cardiology

## 2021-03-07 ENCOUNTER — Encounter: Payer: Self-pay | Admitting: Cardiology

## 2021-03-07 VITALS — BP 114/68 | HR 78 | Ht 71.0 in | Wt 158.4 lb

## 2021-03-07 DIAGNOSIS — I5020 Unspecified systolic (congestive) heart failure: Secondary | ICD-10-CM

## 2021-03-07 DIAGNOSIS — Z79899 Other long term (current) drug therapy: Secondary | ICD-10-CM | POA: Diagnosis not present

## 2021-03-07 DIAGNOSIS — I255 Ischemic cardiomyopathy: Secondary | ICD-10-CM

## 2021-03-07 DIAGNOSIS — I25119 Atherosclerotic heart disease of native coronary artery with unspecified angina pectoris: Secondary | ICD-10-CM

## 2021-03-07 MED ORDER — ALBUTEROL SULFATE HFA 108 (90 BASE) MCG/ACT IN AERS
2.0000 | INHALATION_SPRAY | RESPIRATORY_TRACT | 0 refills | Status: AC | PRN
Start: 2021-03-07 — End: ?

## 2021-03-07 MED ORDER — NITROGLYCERIN 0.4 MG SL SUBL: 0.4000 mg | SUBLINGUAL_TABLET | SUBLINGUAL | 3 refills | Status: DC | PRN

## 2021-03-07 NOTE — Patient Instructions (Addendum)
Medication Instructions:  Your physician recommends that you continue on your current medications as directed. Please refer to the Current Medication list given to you today.  Labwork: BMET in 3 months just before your next visit in April 2023  [facility] or [facility] Non-fasting  Testing/Procedures: Your physician has requested that you have an echocardiogram in 3 months just before your next visit in April 2023. Echocardiography is a painless test that uses sound waves to create images of your heart. It provides your doctor with information about the size and shape of your heart and how well your hearts chambers and valves are working. This procedure takes approximately one hour. There are no restrictions for this procedure.  Follow-Up: Your physician recommends that you schedule a follow-up appointment in: 3 months  Any Other Special Instructions Will Be Listed Below (If Applicable).  If you need a refill on your cardiac medications before your next appointment, please call your pharmacy.

## 2021-03-10 ENCOUNTER — Other Ambulatory Visit: Payer: Self-pay | Admitting: Student

## 2021-03-11 NOTE — Telephone Encounter (Signed)
This is a CHF pt 

## 2021-06-06 ENCOUNTER — Other Ambulatory Visit: Payer: BC Managed Care – PPO

## 2021-06-17 NOTE — Progress Notes (Signed)
? ? ?Cardiology Office Note ? ?Date: 06/18/2021  ? ?ID: [name], DOB 10-11-1965, MRN [id number] ? ?PCP:  Pcp, No  ?Cardiologist:  Nona Dell, MD ?Electrophysiologist:  None  ? ?Chief Complaint  ?Patient presents with  ? Cardiac follow-up  ? ? ?History of Present Illness: ?[name] is a 56 y.o. male last seen in January.  He is here for a routine visit.  States that he has been doing well, no angina symptoms, reports NYHA class I-II dyspnea.  He is working in Production designer, theatre/television/film at [employer]. ? ?We went over his medications today which are listed below.  Due for follow-up BMET and his echocardiogram has been rescheduled for May 2.  He does not report any obvious drug intolerances. ? ?Past Medical History:  ?Diagnosis Date  ? Coronary arteriosclerosis   ? a. PCI LAD 2009 in [place];  b. 12/2010 Cath: LM: nl, LAD: patent stent mid, 25 mid, LCX nl, RCA: non dominant, nl, EF 50%  ? History of methamphetamine abuse (HCC)   ? a. Used x 6 yrs, quit July 2012  ? History of tobacco abuse   ? a. 41 yrs, up to 3 ppd, quit July 2012  ? Hypercholesterolemia   ? ? ?Past Surgical History:  ?Procedure Laterality Date  ? BACK SURGERY    ? fusion-lumbar  ? CORONARY ANGIOPLASTY WITH STENT PLACEMENT    ? CORONARY/GRAFT ACUTE MI REVASCULARIZATION N/A 04/22/2020  ? Procedure: Coronary/Graft Acute MI Revascularization;  Surgeon: Lennette Bihari, MD;  Location: [facility] INVASIVE CV LAB;  Service: Cardiovascular;  Laterality: N/A;  ? LEFT HEART CATH AND CORONARY ANGIOGRAPHY N/A 04/22/2020  ? Procedure: LEFT HEART CATH AND CORONARY ANGIOGRAPHY;  Surgeon: Lennette Bihari, MD;  Location: [facility] INVASIVE CV LAB;  Service: Cardiovascular;  Laterality: N/A;  ? ? ?Current Outpatient Medications  ?Medication Sig Dispense Refill  ? albuterol (VENTOLIN HFA) 108 (90 Base) MCG/ACT inhaler Inhale 2 puffs into the lungs every 4 (four) hours as needed for wheezing or shortness of breath. 1 each 0  ? aspirin EC 81 MG tablet Take 1 tablet (81 mg total) by mouth  daily. 90 tablet 3  ? atorvastatin (LIPITOR) 80 MG tablet Take 1 tablet (80 mg total) by mouth daily. 90 tablet 3  ? buPROPion (WELLBUTRIN SR) 150 MG 12 hr tablet Take 1 tablet by mouth twice daily 60 tablet 5  ? carvedilol (COREG) 3.125 MG tablet Take 1 tablet (3.125 mg total) by mouth 2 (two) times daily with a meal. 180 tablet 3  ? clopidogrel (PLAVIX) 75 MG tablet Take 1 tablet (75 mg total) by mouth daily. 90 tablet 3  ? dapagliflozin propanediol (FARXIGA) 10 MG TABS tablet Take 1 tablet (10 mg total) by mouth daily. 90 tablet 3  ? fluticasone (FLONASE) 50 MCG/ACT nasal spray Place 1-2 sprays into both nostrils daily as needed for allergies.    ? nitroGLYCERIN (NITROSTAT) 0.4 MG SL tablet Place 1 tablet (0.4 mg total) under the tongue every 5 (five) minutes x 3 doses as needed (if no relief after 2nd dose, proceed to ED or call 911). 25 tablet 3  ? pantoprazole (PROTONIX) 40 MG tablet Take 1 tablet by mouth once daily 30 tablet 6  ? sacubitril-valsartan (ENTRESTO) 24-26 MG Take 1 tablet by mouth 2 (two) times daily. 60 tablet 6  ? spironolactone (ALDACTONE) 25 MG tablet Take 1 tablet (25 mg total) by mouth daily. 90 tablet 3  ? ?No current facility-administered medications for this visit.  ? ?  Allergies:  Patient has no known allergies.  ? ?ROS: Palpitations or syncope. ? ?Physical Exam: ?VS:  BP 118/80   Pulse 69   Ht 5\' 11"  (1.803 m)   Wt 156 lb 3.2 oz (70.9 kg)   SpO2 97%   BMI 21.79 kg/m? , BMI Body mass index is 21.79 kg/m?. ? ?Wt Readings from Last 3 Encounters:  ?06/18/21 156 lb 3.2 oz (70.9 kg)  ?03/07/21 158 lb 6.4 oz (71.8 kg)  ?02/01/21 160 lb 9.6 oz (72.8 kg)  ?  ?General: Patient appears comfortable at rest. ?HEENT: Conjunctiva and lids normal. ?Neck: Supple, no elevated JVP or carotid bruits, no thyromegaly. ?Lungs: Clear to auscultation, nonlabored breathing at rest. ?Cardiac: Regular rate and rhythm, no S3 or significant systolic murmur, no pericardial rub. ?Extremities: No pitting  edema. ? ?ECG:  An ECG dated 01/13/2021 was personally reviewed today and demonstrated:  Sinus rhythm with low voltage and old anterior infarct pattern. ? ?Recent Labwork: ?07/27/2020: B Natriuretic Peptide 29.6 ?01/13/2021: Hemoglobin 13.9; Platelets 284 ?01/14/2021: BUN 13; Creatinine, Ser 0.89; Potassium 3.8; Sodium 137  ?   ?Component Value Date/Time  ? CHOL 104 01/14/2021 0153  ? TRIG 94 01/14/2021 0153  ? HDL 29 (L) 01/14/2021 0153  ? CHOLHDL 3.6 01/14/2021 0153  ? VLDL 19 01/14/2021 0153  ? LDLCALC 56 01/14/2021 0153  ? ? ?Other Studies Reviewed Today: ? ?Cardiac catheterization 04/22/2020: ?2nd Diag lesion is 20% stenosed. ?Mid LAD to Dist LAD lesion is 100% stenosed. ?Post intervention, there is a 0% residual stenosis. ?Mid LAD lesion is 20% stenosed. ?A stent was successfully placed. ?The left ventricular ejection fraction is 25-35% by visual estimate. ?LV end diastolic pressure is mildly elevated. ?There is severe left ventricular systolic dysfunction. ?  ?Acute anterolateral myocardial infarction secondary to total occlusion of the LAD immediately proximal to the previously placed mid LAD stent with initial TIMI 0 flow. ?  ?No significant concomitant CAD with a normal ramus intermediate, a large dominant normal left circumflex vessel, and a normal nondominant RCA. ?  ?Successful PCI to the LAD with ultimate insertion of a 3.0 x 26 mm Resolute stent postdilated with taper from 3.2-3.28mm with residual stenosis being reduced to 0%.  There is resumption of brisk TIMI-3 flow.  There is no change in the mild 20% stenosis proximal to the second diagonal vessel and 20% ostial second diagonal stenosis. ?  ?Severe LV dysfunction with severe hypocontractility involving the mid, distal anterolateral wall extending around the apex and involving the apical inferior segment with EF estimated at approximately 25 -30% acutely.  LVEDP 19 mm. ?  ?RECOMMENDATION: ?DAPT for minimum of 12 months but recommend longer duration.   Guideline directed medical therapy for LV dysfunction with initiation of carvedilol, with subsequent ARB and potential transition to Entresto, aldosterone blockade and possible SGLT2 inhibition depending upon LV function recovery.  Aggressive lipid-lowering therapy with target LDL less than 70.  Smoking cessation. ?  ?Echocardiogram 09/11/2020: ? 1. Left ventricular ejection fraction, by estimation, is 40 to 45%. The  ?left ventricle has mildly decreased function. The left ventricle  ?demonstrates regional wall motion abnormalities (see scoring  ?diagram/findings for description). Left ventricular  ?diastolic parameters are consistent with Grade I diastolic dysfunction  ?(impaired relaxation).  ? 2. Right ventricular systolic function is normal. The right ventricular  ?size is normal. There is normal pulmonary artery systolic pressure. The  ?estimated right ventricular systolic pressure is 19.0 mmHg.  ? 3. The mitral valve is grossly normal. No  evidence of mitral valve  ?regurgitation.  ? 4. The aortic valve is tricuspid. Aortic valve regurgitation is not  ?visualized.  ? 5. The inferior vena cava is normal in size with greater than 50%  ?respiratory variability, suggesting right atrial pressure of 3 mmHg.  ? ?Comparison(s): Echocardiogram done 04/23/20 showed an EF of 25-30%.  ?  ?Lexiscan Myoview 01/14/2021: ?  Lexiscan stress is electrically negative for ischemia . ?  Nuclear  images show decreased tracer activity in the inferior wall (base, mid, distal), distal inferoseptal and apical walls    In the stress images there is mild improvement in the mid region, not significant for ischemia Overall consistent with scar and possible soft tissue attenuation with minimal periinfarct ischemia. ?  LVEF 47%  with distal inferior and apical akinesis ?  Intermediate risk scan ?  Compared to previous scan inferior changes more prominent, LVEF relatively unchanged. ? ?Assessment and Plan: ? ?1.  HFrEF with ischemic  cardiomyopathy.  LVEF 40 to 45% by last assessment with follow-up echocardiogram pending.  He is clinically stable with NYHA class I-II dyspnea.  Continue Coreg, Aldactone, Entresto, and [place]Farxiga.  Follow-up BMET. ? ?2.  CAD stat

## 2021-06-18 ENCOUNTER — Ambulatory Visit (INDEPENDENT_AMBULATORY_CARE_PROVIDER_SITE_OTHER): Payer: BC Managed Care – PPO | Admitting: Cardiology

## 2021-06-18 ENCOUNTER — Encounter: Payer: Self-pay | Admitting: Cardiology

## 2021-06-18 VITALS — BP 118/80 | HR 69 | Ht 71.0 in | Wt 156.2 lb

## 2021-06-18 DIAGNOSIS — I25119 Atherosclerotic heart disease of native coronary artery with unspecified angina pectoris: Secondary | ICD-10-CM

## 2021-06-18 DIAGNOSIS — I255 Ischemic cardiomyopathy: Secondary | ICD-10-CM

## 2021-06-18 DIAGNOSIS — I5020 Unspecified systolic (congestive) heart failure: Secondary | ICD-10-CM

## 2021-06-18 NOTE — Patient Instructions (Addendum)
Medication Instructions:  Your physician recommends that you continue on your current medications as directed. Please refer to the Current Medication list given to you today.  Labwork: BMET today at [facility]  Testing/Procedures: none  Follow-Up: Your physician recommends that you schedule a follow-up appointment in: 6 months  Any Other Special Instructions Will Be Listed Below (If Applicable).  If you need a refill on your cardiac medications before your next appointment, please call your pharmacy. 

## 2021-06-19 ENCOUNTER — Encounter: Payer: Self-pay | Admitting: *Deleted

## 2021-06-24 ENCOUNTER — Encounter (HOSPITAL_BASED_OUTPATIENT_CLINIC_OR_DEPARTMENT_OTHER): Payer: Self-pay

## 2021-06-25 DIAGNOSIS — Z79899 Other long term (current) drug therapy: Secondary | ICD-10-CM | POA: Diagnosis not present

## 2021-06-26 ENCOUNTER — Emergency Department (HOSPITAL_COMMUNITY): Payer: PRIVATE HEALTH INSURANCE

## 2021-06-26 ENCOUNTER — Telehealth: Payer: Self-pay | Admitting: Radiology

## 2021-06-26 ENCOUNTER — Emergency Department (HOSPITAL_COMMUNITY)
Admission: EM | Admit: 2021-06-26 | Discharge: 2021-06-26 | Disposition: A | Discharge status: Home / Self Care | Payer: PRIVATE HEALTH INSURANCE | Attending: Emergency Medicine | Admitting: Emergency Medicine

## 2021-06-26 DIAGNOSIS — I2510 Atherosclerotic heart disease of native coronary artery without angina pectoris: Secondary | ICD-10-CM | POA: Insufficient documentation

## 2021-06-26 DIAGNOSIS — S90931A Unspecified superficial injury of right great toe, initial encounter: Secondary | ICD-10-CM | POA: Diagnosis present

## 2021-06-26 DIAGNOSIS — Z79899 Other long term (current) drug therapy: Secondary | ICD-10-CM | POA: Insufficient documentation

## 2021-06-26 DIAGNOSIS — W312XXA Contact with powered woodworking and forming machines, initial encounter: Secondary | ICD-10-CM | POA: Diagnosis not present

## 2021-06-26 DIAGNOSIS — S92421A Displaced fracture of distal phalanx of right great toe, initial encounter for closed fracture: Secondary | ICD-10-CM | POA: Insufficient documentation

## 2021-06-26 DIAGNOSIS — Y990 Civilian activity done for income or pay: Secondary | ICD-10-CM | POA: Insufficient documentation

## 2021-06-26 DIAGNOSIS — I509 Heart failure, unspecified: Secondary | ICD-10-CM | POA: Diagnosis not present

## 2021-06-26 DIAGNOSIS — R58 Hemorrhage, not elsewhere classified: Secondary | ICD-10-CM | POA: Diagnosis not present

## 2021-06-26 DIAGNOSIS — S91114A Laceration without foreign body of right lesser toe(s) without damage to nail, initial encounter: Secondary | ICD-10-CM | POA: Diagnosis not present

## 2021-06-26 DIAGNOSIS — Z7982 Long term (current) use of aspirin: Secondary | ICD-10-CM | POA: Insufficient documentation

## 2021-06-26 DIAGNOSIS — W19XXXA Unspecified fall, initial encounter: Secondary | ICD-10-CM | POA: Diagnosis not present

## 2021-06-26 DIAGNOSIS — Z7902 Long term (current) use of antithrombotics/antiplatelets: Secondary | ICD-10-CM | POA: Diagnosis not present

## 2021-06-26 DIAGNOSIS — S92411A Displaced fracture of proximal phalanx of right great toe, initial encounter for closed fracture: Secondary | ICD-10-CM | POA: Diagnosis not present

## 2021-06-26 MED ORDER — CEPHALEXIN 500 MG PO CAPS: 500.0000 mg | ORAL_CAPSULE | Freq: Four times a day (QID) | ORAL | 0 refills | Status: DC

## 2021-06-26 MED ORDER — OXYCODONE-ACETAMINOPHEN 5-325 MG PO TABS: 1.0000 | ORAL_TABLET | Freq: Four times a day (QID) | ORAL | 0 refills | Status: DC | PRN

## 2021-06-26 MED ORDER — LIDOCAINE-EPINEPHRINE (PF) 2 %-1:200000 IJ SOLN
20.0000 mL | Freq: Once | INTRAMUSCULAR | Status: AC
  Administered 2021-06-26: 20 mL via INTRADERMAL
  Filled 2021-06-26: qty 20

## 2021-06-26 MED ORDER — CEFAZOLIN SODIUM-DEXTROSE 1-4 GM/50ML-% IV SOLN
1.0000 g | Freq: Once | INTRAVENOUS | Status: AC
Start: 2021-06-26 — End: 2021-06-26
  Administered 2021-06-26: 1 g via INTRAVENOUS
  Filled 2021-06-26 (×2): qty 50

## 2021-06-26 MED ORDER — MORPHINE SULFATE (PF) 4 MG/ML IV SOLN
4.0000 mg | Freq: Once | INTRAVENOUS | Status: AC
  Administered 2021-06-26: 4 mg via INTRAVENOUS
  Filled 2021-06-26: qty 1

## 2021-06-26 NOTE — ED Triage Notes (Signed)
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 Fourth toe is bleeding and pt c/o pain to top of his foot. PMS intact.  ?

## 2021-06-26 NOTE — Telephone Encounter (Signed)
Patient called and said he was in the ED this am, multiple fractures in foot.  When do you want me to bring him in?   ?Toenail avulsion as well, toenail removed in ED, bleeding, I advised them to add dressing to it- said the ED did not.  Advised ice and elevation- said there was a lot of swelling just the last few hours.  In a cam walker.   ?

## 2021-06-26 NOTE — ED Provider Notes (Signed)
?[facility] EMERGENCY DEPARTMENT ?Provider Note ? ? ?CSN: [id number] ?Arrival date & time: 06/26/21  0407 ? ?  ? ?History ? ?Chief Complaint  ?Patient presents with  ? Foot Injury  ? ? ?[name] is a 56 y.o. male. ? ?Patient is a 56 year old male with past medical history of coronary artery disease, CHF, ischemic cardiomyopathy.  Patient presenting today with complaints of a right foot injury.  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  He is having bleeding from his right fourth toe as well as pain in the great toe. ? ?The history is provided by the patient.  ? ?  ? ?Home Medications ?Prior to Admission medications   ?Medication Sig Start Date End Date Taking? Authorizing Provider  ?albuterol (VENTOLIN HFA) 108 (90 Base) MCG/ACT inhaler Inhale 2 puffs into the lungs every 4 (four) hours as needed for wheezing or shortness of breath. 03/07/21   Jonelle Sidle, MD  ?aspirin EC 81 MG tablet Take 1 tablet (81 mg total) by mouth daily. 06/29/20   Netta Neat., NP  ?atorvastatin (LIPITOR) 80 MG tablet Take 1 tablet (80 mg total) by mouth daily. 06/29/20   Netta Neat., NP  ?buPROPion [facility] SR) 150 MG 12 hr tablet Take 1 tablet by mouth twice daily 11/16/20   Laurey Morale, MD  ?carvedilol (COREG) 3.125 MG tablet Take 1 tablet (3.125 mg total) by mouth 2 (two) times daily with a meal. 06/29/20   Netta Neat., NP  ?clopidogrel (PLAVIX) 75 MG tablet Take 1 tablet (75 mg total) by mouth daily. 06/29/20   Netta Neat., NP  ?dapagliflozin propanediol (FARXIGA) 10 MG TABS tablet Take 1 tablet (10 mg total) by mouth daily. 06/29/20   Netta Neat., NP  ?fluticasone Aleda Grana) 50 MCG/ACT nasal spray Place 1-2 sprays into both nostrils daily as needed for allergies. 12/24/20   [provider]  ?nitroGLYCERIN (NITROSTAT) 0.4 MG SL tablet Place 1 tablet (0.4 mg total) under the tongue every 5 (five) minutes x 3 doses as needed (if no  relief after 2nd dose, proceed to ED or call 911). 03/07/21   Jonelle Sidle, MD  ?pantoprazole (PROTONIX) 40 MG tablet Take 1 tablet by mouth once daily 03/11/21   Laurey Morale, MD  ?sacubitril-valsartan (ENTRESTO) 24-26 MG Take 1 tablet by mouth 2 (two) times daily. 02/01/21   Laurey Morale, MD  ?spironolactone (ALDACTONE) 25 MG tablet Take 1 tablet (25 mg total) by mouth daily. 07/27/20   Laurey Morale, MD  ?   ? ?Allergies    ?Patient has no known allergies.   ? ?Review of Systems   ?Review of Systems  ?All other systems reviewed and are negative. ? ?Physical Exam ?Updated Vital Signs ?BP (!) 142/86   Pulse 83   Temp 98.7 ?F (37.1 ?C) (Oral)   Resp 18   SpO2 99%  ?Physical Exam ?Vitals and nursing note reviewed.  ?Constitutional:   ?   Appearance: Normal appearance.  ?HENT:  ?   Head: Normocephalic and atraumatic.  ?Pulmonary:  ?   Effort: Pulmonary effort is normal.  ?Musculoskeletal:  ?   Comments: The right fourth toe is noted to have a laceration between it and the third toe.  Bleeding is controlled.  The nail is in place, but the loose.  ?Skin: ?   General: Skin is warm and dry.  ?Neurological:  ?  Mental Status: He is alert and oriented to person, place, and time.  ? ? ?ED Results / Procedures / Treatments   ?Labs ?(all labs ordered are listed, but only abnormal results are displayed) ?Labs Reviewed - No data to display ? ?EKG ?None ? ?Radiology ?No results found. ? ?Procedures ?Procedures  ? ? ?Medications Ordered in ED ?Medications - No data to display ? ?ED Course/ Medical Decision Making/ A&P ? ?Patient is a 56 year old male presenting with complaints of a right foot injury, the details of which are described in the HPI.  X-rays show fractures to the first, third, and fourth toes as described in the radiology report.  The fourth toe has an avulsion of the toenail along with tissue of the top of the toe.  The tissue was sutured into place as per procedure note.  The nail was barely attached  and was ultimately removed. ? ?Digital blocks of the first and fourth toes were performed.  The first toe was reduced into anatomic alignment.  Patient will be placed into a cam walker and advised to follow-up with orthopedics in the next 1 to 2 days. ? ?As there is a fracture at the base of the fourth toe, the possibility of an open fracture was entertained, however the fractures seems more proximal than the laceration itself.  Either way, patient was treated with Ancef and will be discharged with Keflex. ? ?LACERATION REPAIR ?Performed by: Geoffery Lyons ?Authorized by: Geoffery Lyons ?Consent: Verbal consent obtained. ?Risks and benefits: risks, benefits and alternatives were discussed ?Consent given by: patient ?Patient identity confirmed: provided demographic data ?Prepped and Draped in normal sterile fashion ?Wound explored ? ?Laceration Location: Right fourth toe ? ?Laceration Length: 1 cm ? ?No Foreign Bodies seen or palpated ? ?Anesthesia: local infiltration ? ?Local anesthetic: lidocaine 2% without epinephrine as a digital block ? ?Anesthetic total: 6 ml ? ?Irrigation method: syringe ?Amount of cleaning: standard ? ?Skin closure: 4-0 Ethilon ? ?Number of sutures: 2 ? ?Technique: Simple interrupted ? ?Patient tolerance: Patient tolerated the procedure well with no immediate complications. ? ? ?Final Clinical Impression(s) / ED Diagnoses ?Final diagnoses:  ?None  ? ? ?Rx / DC Orders ?ED Discharge Orders   ? ? None  ? ?  ? ? ?  ?Geoffery Lyons, MD ?06/26/21 [phone] ? ?

## 2021-06-26 NOTE — Telephone Encounter (Signed)
I called LMVM for her to call me back. ?

## 2021-06-26 NOTE — Discharge Instructions (Addendum)
Wear CAM Walker until followed up by orthopedics. ? ?Begin taking Keflex as prescribed. ? ?Take ibuprofen 600 mg every 6 hours as needed for pain.  Begin taking Percocet as prescribed as needed for pain not relieved with ibuprofen. ? ?You are to follow-up with orthopedics in the next 1 to 2 days.  The contact information for Dr. Romeo Apple has been provided in this discharge summary for you to call and make these arrangements. ?

## 2021-06-26 NOTE — Telephone Encounter (Signed)
Beth [employer] engineer Specialist )left message for Worker's Comp for this patient. ?Wants to see Dr. Romeo Apple. ? ?Please call [phone] ?

## 2021-06-26 NOTE — Telephone Encounter (Signed)
Any issues with ER treatment go back to ER until he is seen here

## 2021-06-27 ENCOUNTER — Ambulatory Visit (INDEPENDENT_AMBULATORY_CARE_PROVIDER_SITE_OTHER): Payer: PRIVATE HEALTH INSURANCE | Admitting: Orthopedic Surgery

## 2021-06-27 ENCOUNTER — Encounter: Payer: Self-pay | Admitting: Orthopedic Surgery

## 2021-06-27 ENCOUNTER — Ambulatory Visit (INDEPENDENT_AMBULATORY_CARE_PROVIDER_SITE_OTHER): Payer: PRIVATE HEALTH INSURANCE

## 2021-06-27 VITALS — BP 132/85 | HR 74 | Ht 71.0 in | Wt 161.0 lb

## 2021-06-27 DIAGNOSIS — S92421A Displaced fracture of distal phalanx of right great toe, initial encounter for closed fracture: Secondary | ICD-10-CM

## 2021-06-27 DIAGNOSIS — M79674 Pain in right toe(s): Secondary | ICD-10-CM

## 2021-06-27 DIAGNOSIS — S92911A Unspecified fracture of right toe(s), initial encounter for closed fracture: Secondary | ICD-10-CM

## 2021-06-27 NOTE — Progress Notes (Signed)
NEW PROBLEM//work related OFFICE VISIT ? ?LDW June 26, 2021 ?FOV June 27, 2021 ?DOI June 26, 2021 ? ?Chief Complaint  ?Patient presents with  ? Foot Injury  ?  Rt foot multiple fracture  ? ? ?[name] comes in today after being in the ER on the 26th of 23 I have the notes from the ER.  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 ?The ER cleaned and closed with 2, 4-0 nylon sutures.  He is here complaining of pain in his foot ? ?He also fell backwards and says that he hit his head and hurt his back ? ?He has no dizziness no light sensitivity no ringing of the ears he initially had a headache but now its has resolved ? ?As far as his back goes he has no tingling in his legs no weakness the pain is in the middle upper lumbar area.  He has some pain when he bends forward but he says it only hurts a little bit ? ?The ER notes indicate that the first and fourth digits were reduced into anatomic alignment ? ?This is confirmed today by repeat x-ray ? ? ? ? ?ROS the patient has a history of heart disease but no current chest pain has not taken any nitroglycerin ?4-year no shortness of breath noted. ? ? ?Past Medical History:  ?Diagnosis Date  ? Coronary arteriosclerosis   ? a. PCI LAD 2009 in [place];  b. 12/2010 Cath: LM: nl, LAD: patent stent mid, 25 mid, LCX nl, RCA: non dominant, nl, EF 50%  ? History of methamphetamine abuse (HCC)   ? a. Used x 6 yrs, quit July 2012  ? History of tobacco abuse   ? a. 41 yrs, up to 3 ppd, quit July 2012  ? Hypercholesterolemia   ? ? ?Past Surgical History:  ?Procedure Laterality Date  ? BACK SURGERY    ? fusion-lumbar  ? CORONARY ANGIOPLASTY WITH STENT PLACEMENT    ? CORONARY/GRAFT ACUTE MI REVASCULARIZATION N/A 04/22/2020  ? Procedure: Coronary/Graft Acute MI Revascularization;  Surgeon: Lennette BihariKelly, Thomas A, MD;  [facility];  Service: Cardiovascular;  Laterality: N/A;  ? LEFT HEART CATH AND CORONARY ANGIOGRAPHY N/A 04/22/2020  ? Procedure: LEFT HEART CATH AND CORONARY ANGIOGRAPHY;  Surgeon: Lennette BihariKelly, Thomas A, MD;  [facility];  Service: Cardiovascular;  Laterality: N/A;  ? ? ?Family History  ?Problem Relation Age of Onset  ? Heart attack Father   ?     died @ 3449  ? Coronary artery disease Mother   ?     alive in late 6960's  ? ?Social History  ? ?Tobacco Use  ? Smoking status: Light Smoker  ?  Packs/day: 0.50  ?  Years: 51.00  ?  Pack years: 25.50  ?  Types: Cigarettes  ? Smokeless tobacco: Never  ? Tobacco comments:  ?  Currently 6-8 cigs per day  ?Vaping Use  ? Vaping Use: Never used  ?Substance Use Topics  ? Alcohol use: No  ? Drug use: Not Currently  ?  Types: Methamphetamines  ?  Comment: previously used methamphetamines - quit 09/2010  ? ? ?No Known Allergies ? ?Current Meds  ?Medication Sig  ? albuterol (VENTOLIN HFA) 108 (90 Base) MCG/ACT inhaler Inhale 2  puffs into the lungs every 4 (four) hours as needed for wheezing or shortness of breath.  ? aspirin EC 81 MG tablet Take 1 tablet (81 mg total) by mouth daily.  ? atorvastatin (LIPITOR) 80 MG tablet Take 1 tablet (80 mg total) by mouth daily.  ? buPROPion (WELLBUTRIN SR) 150 MG 12 hr tablet Take 1 tablet by mouth twice daily  ? carvedilol (COREG) 3.125 MG tablet Take 1 tablet (3.125 mg total) by mouth 2 (two) times daily with a meal.  ? cephALEXin (KEFLEX) 500 MG capsule Take 1 capsule (500 mg total) by mouth 4 (four) times daily.  ? clopidogrel (PLAVIX) 75 MG tablet Take 1 tablet (75 mg total) by mouth daily.  ? dapagliflozin propanediol (FARXIGA) 10 MG TABS tablet Take 1 tablet (10 mg total) by mouth daily.  ? fluticasone (FLONASE) 50 MCG/ACT nasal spray Place 1-2 sprays into both nostrils daily as needed for allergies.  ? nitroGLYCERIN (NITROSTAT) 0.4 MG SL tablet Place 1 tablet (0.4 mg total) under the tongue every 5 (five) minutes x 3 doses  as needed (if no relief after 2nd dose, proceed to ED or call 911).  ? oxyCODONE-acetaminophen (PERCOCET) 5-325 MG tablet Take 1-2 tablets by mouth every 6 (six) hours as needed.  ? pantoprazole (PROTONIX) 40 MG tablet Take 1 tablet by mouth once daily  ? sacubitril-valsartan (ENTRESTO) 24-26 MG Take 1 tablet by mouth 2 (two) times daily.  ? spironolactone (ALDACTONE) 25 MG tablet Take 1 tablet (25 mg total) by mouth daily.  ? ? ?BP 132/85   Pulse 74   Ht 5\' 11"  (1.803 m)   Wt 161 lb (73 kg)   BMI 22.45 kg/m?  ? ?Physical Exam ?Vitals and nursing note reviewed.  ?Constitutional:   ?   General: He is not in acute distress. ?   Appearance: Normal appearance. He is not ill-appearing, toxic-appearing or diaphoretic.  ?HENT:  ?   Head: Normocephalic and atraumatic.  ?   Right Ear: External ear normal.  ?   Left Ear: External ear normal.  ?   Nose: No congestion or rhinorrhea.  ?Eyes:  ?   General: No scleral icterus. ?   Extraocular Movements: Extraocular movements intact.  ?   Conjunctiva/sclera: Conjunctivae normal.  ?   Pupils: Pupils are equal, round, and reactive to light.  ?Musculoskeletal:  ?   Comments: All digits on his foot seem to have normal alignment, pain I believe limited some of his motion  ?Skin: ?   General: Skin is warm.  ?   Capillary Refill: Capillary refill takes less than 2 seconds.  ?Neurological:  ?   General: No focal deficit present.  ?   Mental Status: He is alert and oriented to person, place, and time.  ?   Sensory: No sensory deficit.  ?   Gait: Gait abnormal.  ?Psychiatric:     ?   Mood and Affect: Mood normal.     ?   Behavior: Behavior normal.     ?   Thought Content: Thought content normal.     ?   Judgment: Judgment normal.  ? ? ? ? ? ? ?MEDICAL DECISION SECTION  ?Xrays were done at ?[facility] ? ?My independent reading of xrays:  ?Fracture great toe distal phalanx and fourth digit proximal phalanx ? ?Repeat exam x-rays in the office show the toes are realigned very  well ? ?Encounter Diagnosis  ?Name Primary?  ? Toe pain, right Yes  ? ? ?  PLAN:  ?OOW April 26 +4 weeks ? ?RTW 4 weeks from April 26 ? ?WORK RESTRICTIONS: No work present ? ?Follow-up in the office in a week to check the fourth digit.  In the meantime continue Keflex Percocet as needed keep it clean and dry sutures can be removed next visit ? ?No orders of the defined types were placed in this encounter. ? ? ?Fuller Canada, MD ? ?06/27/2021 ?5:22 PM  ?

## 2021-06-27 NOTE — Patient Instructions (Signed)
Follow up in one week wound check/RT 3/4 toe//WC// ?OOW 4 weeks from YESTERDAY ?

## 2021-07-02 ENCOUNTER — Ambulatory Visit (INDEPENDENT_AMBULATORY_CARE_PROVIDER_SITE_OTHER): Payer: BC Managed Care – PPO

## 2021-07-02 DIAGNOSIS — I255 Ischemic cardiomyopathy: Secondary | ICD-10-CM

## 2021-07-02 DIAGNOSIS — I5020 Unspecified systolic (congestive) heart failure: Secondary | ICD-10-CM

## 2021-07-02 LAB — ECHOCARDIOGRAM COMPLETE
AR max vel: 2.73 cm2
AV Area VTI: 2.7 cm2
AV Area mean vel: 2.43 cm2
AV Mean grad: 2 mmHg
AV Peak grad: 3.8 mmHg
Ao pk vel: 0.97 m/s
Area-P 1/2: 2.62 cm2
Calc EF: 45.9 %
S' Lateral: 3.66 cm
Single Plane A2C EF: 45.2 %
Single Plane A4C EF: 45 %

## 2021-07-04 ENCOUNTER — Ambulatory Visit (INDEPENDENT_AMBULATORY_CARE_PROVIDER_SITE_OTHER): Payer: Worker's Compensation | Admitting: Orthopedic Surgery

## 2021-07-04 ENCOUNTER — Telehealth: Payer: Self-pay | Admitting: Orthopedic Surgery

## 2021-07-04 ENCOUNTER — Encounter: Payer: Self-pay | Admitting: Orthopedic Surgery

## 2021-07-04 DIAGNOSIS — S92421A Displaced fracture of distal phalanx of right great toe, initial encounter for closed fracture: Secondary | ICD-10-CM

## 2021-07-04 DIAGNOSIS — S92911A Unspecified fracture of right toe(s), initial encounter for closed fracture: Secondary | ICD-10-CM

## 2021-07-04 MED ORDER — HYDROCODONE-ACETAMINOPHEN 5-325 MG PO TABS: 1.0000 | ORAL_TABLET | Freq: Four times a day (QID) | ORAL | 0 refills | Status: AC | PRN

## 2021-07-04 MED ORDER — IBUPROFEN 800 MG PO TABS: 800.0000 mg | ORAL_TABLET | Freq: Three times a day (TID) | ORAL | 1 refills | Status: AC | PRN

## 2021-07-04 NOTE — Patient Instructions (Signed)
No work at present  ? ?He cant drive and doesn't have a ride  ? ?Keep OOW the same as previously noted  ? ?Follow up in 3 weeks ?

## 2021-07-04 NOTE — Telephone Encounter (Signed)
Voice message received 07/03/21 for "request of all records" from [facility] per case manager Marily Memos, ph# [phone] / fax# [phone]. Also received her email address, as message states fax may take 48 hours. Email: [email or web address] / address: [address] [address], [place], [place] [address] ? - I returned call today, 07/04/21, reached voice mail, left message in response that she may fax her request.*(patient has appointment today, 07/04/21) ?

## 2021-07-04 NOTE — Progress Notes (Signed)
FOLLOW UP  ? ?Encounter Diagnoses  ?Name Primary?  ? Fracture of proximal phalanx of toe of right foot, fourth digit Yes  ? Closed displaced fracture of distal phalanx of right great toe, initial encounter   ? ? ? ?Chief Complaint  ?Patient presents with  ? Foot Injury  ?  Follow up/ wound check/ suture removal  ? ? ? ?Mr. [name] comes in for his routine follow-up to check his wound.  His sutures were taken out.  He has no signs of infection but his toes are still swollen ? ?He still has a significant amount of pain on the dorsum of the foot ? ?He is able to ambulate in the cam walker ? ?However, he is concerned as his employer has asked him to go back to work in a sitting position but he cannot drive with the cam walker.  And he cannot get a ride ? ?I would recommend he continue with soaks he can stop his antibiotics we can reduce the amount of opioid that he is taking and have him come back in 3 weeks continuing with our original plan for out of work for 4 weeks ? ?Norco 5 mg every 6 as needed for pain ?Ibuprofen 800 every 8 as needed for pain ? ?Meds ordered this encounter  ?Medications  ? HYDROcodone-acetaminophen (NORCO/VICODIN) 5-325 MG tablet  ?  Sig: Take 1 tablet by mouth every 6 (six) hours as needed for moderate pain.  ?  Dispense:  30 tablet  ?  Refill:  0  ? ibuprofen (ADVIL) 800 MG tablet  ?  Sig: Take 1 tablet (800 mg total) by mouth every 8 (eight) hours as needed.  ?  Dispense:  90 tablet  ?  Refill:  1  ? ? ?

## 2021-07-09 NOTE — Telephone Encounter (Signed)
Emailed her to inquire if she received what she needs.  ?

## 2021-07-23 ENCOUNTER — Other Ambulatory Visit: Payer: Self-pay

## 2021-07-23 MED ORDER — ATORVASTATIN CALCIUM 80 MG PO TABS: 80.0000 mg | ORAL_TABLET | Freq: Every day | ORAL | 1 refills | Status: DC

## 2021-07-23 MED ORDER — DAPAGLIFLOZIN PROPANEDIOL 10 MG PO TABS: 10.0000 mg | ORAL_TABLET | Freq: Every day | ORAL | 1 refills | Status: DC

## 2021-07-26 ENCOUNTER — Encounter: Payer: Self-pay | Admitting: Orthopedic Surgery

## 2021-07-26 ENCOUNTER — Ambulatory Visit (INDEPENDENT_AMBULATORY_CARE_PROVIDER_SITE_OTHER): Payer: PRIVATE HEALTH INSURANCE | Admitting: Orthopedic Surgery

## 2021-07-26 ENCOUNTER — Encounter: Payer: BC Managed Care – PPO | Admitting: Orthopedic Surgery

## 2021-07-26 DIAGNOSIS — S92911A Unspecified fracture of right toe(s), initial encounter for closed fracture: Secondary | ICD-10-CM

## 2021-07-26 DIAGNOSIS — S92421A Displaced fracture of distal phalanx of right great toe, initial encounter for closed fracture: Secondary | ICD-10-CM

## 2021-07-26 MED ORDER — TRAMADOL-ACETAMINOPHEN 37.5-325 MG PO TABS: 1.0000 | ORAL_TABLET | ORAL | 5 refills | Status: DC | PRN

## 2021-07-26 NOTE — Progress Notes (Signed)
FOLLOW UP   Encounter Diagnoses  Name Primary?   Fracture of proximal phalanx of toe of right foot    Closed displaced fracture of distal phalanx of right great toe, initial encounter Yes     Chief Complaint  Patient presents with   Foot Injury    Follow up RT foot DOI 06/26/21 Improving/ was able to put a shoe on yesterday     [name] has improved although he still complaining of soreness of the fourth digit near the proximal portion of the toe.  It is swollen it is tender the nail seems to be coming back  He is taking ibuprofen it does not help a lot.  There is no gross deformity other than the swelling and the tenderness in the nail deformity the toe alignment looks normal  I think he is ready to go back to work.  He is indicated that he is tired of being at home  I think it is okay to go back to work I told him to keep taking his ibuprofen and I added some Ultracet to help with pain  I also released him today.  Meds ordered this encounter  Medications   traMADol-acetaminophen (ULTRACET) 37.5-325 MG tablet    Sig: Take 1 tablet by mouth every 4 (four) hours as needed.    Dispense:  90 tablet    Refill:  5

## 2021-07-26 NOTE — Patient Instructions (Signed)
Ret to work   Add Catering manager to ibuprofen

## 2021-07-31 ENCOUNTER — Telehealth: Payer: Self-pay | Admitting: Orthopedic Surgery

## 2021-07-31 NOTE — Telephone Encounter (Signed)
Call via voice message received from Charleston Poot, at [place], reference claim # [id number] - asked for any notes from treatment. I returned her call to her ph#[phone] ext [phone] /  her fax# [phone], and asked if she would please fax a request for the specific information needed - aware we do not need a signed release - but a fax to ensure that we may send the information needed.

## 2021-08-12 ENCOUNTER — Other Ambulatory Visit (HOSPITAL_COMMUNITY): Payer: Self-pay | Admitting: Cardiology

## 2021-08-26 ENCOUNTER — Other Ambulatory Visit: Payer: Self-pay | Admitting: *Deleted

## 2021-08-26 MED ORDER — CARVEDILOL 3.125 MG PO TABS: 3.1250 mg | ORAL_TABLET | Freq: Two times a day (BID) | ORAL | 3 refills | Status: DC

## 2021-08-28 ENCOUNTER — Other Ambulatory Visit (HOSPITAL_COMMUNITY): Payer: Self-pay | Admitting: Cardiology

## 2021-08-28 NOTE — Telephone Encounter (Signed)
Followed by [employer]

## 2021-09-07 DIAGNOSIS — T63441A Toxic effect of venom of bees, accidental (unintentional), initial encounter: Secondary | ICD-10-CM | POA: Diagnosis not present

## 2021-09-30 ENCOUNTER — Other Ambulatory Visit: Payer: Self-pay

## 2021-09-30 MED ORDER — CLOPIDOGREL BISULFATE 75 MG PO TABS: 75.0000 mg | ORAL_TABLET | Freq: Every day | ORAL | 1 refills | Status: DC

## 2021-10-07 ENCOUNTER — Encounter: Payer: Self-pay | Admitting: *Deleted

## 2021-10-07 NOTE — Progress Notes (Signed)
Refill request received from [facility] for digoxin 0.125 mg QD #90. Request denied, per review of chart, digoxin stopped by [facility] 02/01/2021.

## 2021-10-14 ENCOUNTER — Other Ambulatory Visit: Payer: Self-pay | Admitting: Cardiology

## 2021-10-16 ENCOUNTER — Other Ambulatory Visit (HOSPITAL_COMMUNITY): Payer: Self-pay

## 2021-10-16 MED ORDER — BUPROPION HCL ER (SR) 150 MG PO TB12: 150.0000 mg | ORAL_TABLET | Freq: Two times a day (BID) | ORAL | 0 refills | Status: DC

## 2021-10-21 ENCOUNTER — Other Ambulatory Visit: Payer: Self-pay | Admitting: Cardiology

## 2021-11-13 ENCOUNTER — Other Ambulatory Visit: Payer: Self-pay | Admitting: Cardiology

## 2021-12-15 NOTE — Progress Notes (Unsigned)
Cardiology Office Note  Date: 12/16/2021   ID: [name], DOB Nov 14, 1965, MRN [id number]  PCP:  Pcp, No  Cardiologist:  Nona Dell, MD Electrophysiologist:  None   Chief Complaint  Patient presents with   Cardiac follow-up    History of Present Illness: [name] is a 56 y.o. male last seen in April.  He is here for a routine visit.  Reports no angina or nitroglycerin use, stable NYHA class I dyspnea.  No orthopnea or PND, no leg swelling.  Still working full-time in Production designer, theatre/television/film at [employer] in [place].  I reviewed his medications which are stable from a cardiac perspective.  He does not report any intolerances.  Does need a refill for fresh bottle of nitroglycerin.  We discussed getting follow-up lab work, he does not have a PCP at this time.  Follow-up echocardiogram in May revealed LVEF improved to the range of 45 to 50%.  Past Medical History:  Diagnosis Date   Coronary arteriosclerosis    a. PCI LAD 2009 in [place];  b. 12/2010 Cath: LM: nl, LAD: patent stent mid, 25 mid, LCX nl, RCA: non dominant, nl, EF 50%   History of methamphetamine abuse (HCC)    a. Used x 6 yrs, quit July 2012   History of tobacco abuse    a. 41 yrs, up to 3 ppd, quit July 2012   Hypercholesterolemia     Past Surgical History:  Procedure Laterality Date   BACK SURGERY     fusion-lumbar   CORONARY ANGIOPLASTY WITH STENT PLACEMENT     CORONARY/GRAFT ACUTE MI REVASCULARIZATION N/A 04/22/2020   Procedure: Coronary/Graft Acute MI Revascularization;  Surgeon: Lennette Bihari, MD;  Location: [facility] INVASIVE CV LAB;  Service: Cardiovascular;  Laterality: N/A;   LEFT HEART CATH AND CORONARY ANGIOGRAPHY N/A 04/22/2020   Procedure: LEFT HEART CATH AND CORONARY ANGIOGRAPHY;  Surgeon: Lennette Bihari, MD;  Location: [facility] INVASIVE CV LAB;  Service: Cardiovascular;  Laterality: N/A;    Current Outpatient Medications  Medication Sig Dispense Refill   albuterol (VENTOLIN HFA) 108 (90 Base) MCG/ACT  inhaler Inhale 2 puffs into the lungs every 4 (four) hours as needed for wheezing or shortness of breath. 1 each 0   aspirin EC 81 MG tablet Take 1 tablet (81 mg total) by mouth daily. 90 tablet 3   buPROPion (WELLBUTRIN SR) 150 MG 12 hr tablet TAKE 1 TABLET BY MOUTH TWICE DAILY PLEASE  SCHEDULE  AN  APPOINTMENT  FOR  FURTHER  REFILLS 60 tablet 0   dapagliflozin propanediol (FARXIGA) 10 MG TABS tablet Take 1 tablet (10 mg total) by mouth daily. 90 tablet 1   fexofenadine (ALLEGRA) 180 MG tablet Take 180 mg by mouth daily.     fluticasone (FLONASE) 50 MCG/ACT nasal spray Place 1-2 sprays into both nostrils daily as needed for allergies.     HYDROcodone-acetaminophen (NORCO/VICODIN) 5-325 MG tablet Take 1 tablet by mouth every 6 (six) hours as needed for moderate pain. 30 tablet 0   ibuprofen (ADVIL) 800 MG tablet Take 1 tablet (800 mg total) by mouth every 8 (eight) hours as needed. 90 tablet 1   pantoprazole (PROTONIX) 40 MG tablet Take 1 tablet by mouth once daily 30 tablet 0   spironolactone (ALDACTONE) 25 MG tablet Take 1 tablet (25 mg total) by mouth daily. NEEDS FOLLOW UP APPOINTMENT FOR ANYMORE REFILLS 60 tablet 0   atorvastatin (LIPITOR) 80 MG tablet Take 1 tablet (80 mg total) by mouth daily. 90  tablet 3   carvedilol (COREG) 3.125 MG tablet Take 1 tablet (3.125 mg total) by mouth 2 (two) times daily with a meal. 180 tablet 3   clopidogrel (PLAVIX) 75 MG tablet Take 1 tablet (75 mg total) by mouth daily. 90 tablet 2   nitroGLYCERIN (NITROSTAT) 0.4 MG SL tablet Place 1 tablet (0.4 mg total) under the tongue every 5 (five) minutes x 3 doses as needed (if no relief after 2nd dose, proceed to ED or call 911). 25 tablet 3   sacubitril-valsartan (ENTRESTO) 24-26 MG TAKE 1 TABLET BY MOUTH TWICE DAILY DISCONTINUE  LOSARTAN 180 tablet 3   No current facility-administered medications for this visit.   Allergies:  Patient has no known allergies.   ROS: No palpitations or syncope.  Physical  Exam: VS:  BP 102/64 (BP Location: Left Arm, Patient Position: Sitting, Cuff Size: Normal)   Pulse 70   Ht 5\' 11"  (1.803 m)   Wt 157 lb 9.6 oz (71.5 kg)   SpO2 99%   BMI 21.98 kg/m , BMI Body mass index is 21.98 kg/m.  Wt Readings from Last 3 Encounters:  12/16/21 157 lb 9.6 oz (71.5 kg)  06/27/21 161 lb (73 kg)  06/18/21 156 lb 3.2 oz (70.9 kg)    General: Patient appears comfortable at rest. HEENT: Conjunctiva and lids normal. Neck: Supple, no elevated JVP or carotid bruits. Lungs: Clear to auscultation, nonlabored breathing at rest. Cardiac: Regular rate and rhythm, no S3 or significant systolic murmur, no pericardial rub. Extremities: No pitting edema.  ECG:  An ECG dated 06/26/2021 was personally reviewed today and demonstrated:  Sinus rhythm with old anterior infarct pattern.  Recent Labwork: 01/13/2021: Hemoglobin 13.9; Platelets 284 01/14/2021: BUN 13; Creatinine, Ser 0.89; Potassium 3.8; Sodium 137     Component Value Date/Time   CHOL 104 01/14/2021 0153   TRIG 94 01/14/2021 0153   HDL 29 (L) 01/14/2021 0153   CHOLHDL 3.6 01/14/2021 0153   VLDL 19 01/14/2021 0153   LDLCALC 56 01/14/2021 0153  April 2023: Potassium 4.0, BUN 16, creatinine 0.86  Other Studies Reviewed Today:  Cardiac catheterization 04/22/2020: 2nd Diag lesion is 20% stenosed. Mid LAD to Dist LAD lesion is 100% stenosed. Post intervention, there is a 0% residual stenosis. Mid LAD lesion is 20% stenosed. A stent was successfully placed. The left ventricular ejection fraction is 25-35% by visual estimate. LV end diastolic pressure is mildly elevated. There is severe left ventricular systolic dysfunction.   Acute anterolateral myocardial infarction secondary to total occlusion of the LAD immediately proximal to the previously placed mid LAD stent with initial TIMI 0 flow.   No significant concomitant CAD with a normal ramus intermediate, a large dominant normal left circumflex vessel, and a normal  nondominant RCA.   Successful PCI to the LAD with ultimate insertion of a 3.0 x 26 mm Resolute stent postdilated with taper from 3.2-3.21mm with residual stenosis being reduced to 0%.  There is resumption of brisk TIMI-3 flow.  There is no change in the mild 20% stenosis proximal to the second diagonal vessel and 20% ostial second diagonal stenosis.   Severe LV dysfunction with severe hypocontractility involving the mid, distal anterolateral wall extending around the apex and involving the apical inferior segment with EF estimated at approximately 25 -30% acutely.  LVEDP 19 mm.   RECOMMENDATION: DAPT for minimum of 12 months but recommend longer duration.  Guideline directed medical therapy for LV dysfunction with initiation of carvedilol, with subsequent ARB and potential transition  to [facility], aldosterone blockade and possible SGLT2 inhibition depending upon LV function recovery.  Aggressive lipid-lowering therapy with target LDL less than 70.  Smoking cessation.  Echocardiogram 07/02/2021:  1. Left ventricular ejection fraction, by estimation, is 45 to 50%. The  left ventricle has mildly decreased function. The left ventricle  demonstrates regional wall motion abnormalities (see scoring  diagram/findings for description). Left ventricular  diastolic parameters are consistent with Grade I diastolic dysfunction  (impaired relaxation).   2. Right ventricular systolic function is normal. The right ventricular  size is normal. There is normal pulmonary artery systolic pressure. The  estimated right ventricular systolic pressure is 14.4 mmHg.   3. The mitral valve is grossly normal. Trivial mitral valve  regurgitation.   4. The aortic valve is tricuspid. Aortic valve regurgitation is not  visualized. No aortic stenosis is present. Aortic valve mean gradient  measures 2.0 mmHg.   5. The inferior vena cava is normal in size with greater than 50%  respiratory variability, suggesting right atrial  pressure of 3 mmHg.   Assessment and Plan:  1.  HFmrEF with ischemic cardiomyopathy, LVEF 45 to 50% range by most recent assessment.  He is clinically stable with NYHA class I symptoms, no active angina.  Continue Coreg, Entresto, Aldactone, and [place].  Check BMET.  2.  CAD status post DES to the mid LAD in 2009 with anterolateral STEMI in February 2022 managed with DES to the mid LAD.  No active angina.  Refill provided for fresh bottle of nitroglycerin.  Continuing long-term DAPT as per cardiac catheterization recommendations.  Also on Lipitor, will follow-up lipid profile.  His last LDL was 56.  Medication Adjustments/Labs and Tests Ordered: Current medicines are reviewed at length with the patient today.  Concerns regarding medicines are outlined above.   Tests Ordered: No orders of the defined types were placed in this encounter.   Medication Changes: Meds ordered this encounter  Medications   nitroGLYCERIN (NITROSTAT) 0.4 MG SL tablet    Sig: Place 1 tablet (0.4 mg total) under the tongue every 5 (five) minutes x 3 doses as needed (if no relief after 2nd dose, proceed to ED or call 911).    Dispense:  25 tablet    Refill:  3   sacubitril-valsartan (ENTRESTO) 24-26 MG    Sig: TAKE 1 TABLET BY MOUTH TWICE DAILY DISCONTINUE  LOSARTAN    Dispense:  180 tablet    Refill:  3   clopidogrel (PLAVIX) 75 MG tablet    Sig: Take 1 tablet (75 mg total) by mouth daily.    Dispense:  90 tablet    Refill:  2   carvedilol (COREG) 3.125 MG tablet    Sig: Take 1 tablet (3.125 mg total) by mouth 2 (two) times daily with a meal.    Dispense:  180 tablet    Refill:  3   atorvastatin (LIPITOR) 80 MG tablet    Sig: Take 1 tablet (80 mg total) by mouth daily.    Dispense:  90 tablet    Refill:  3    Disposition:  Follow up  6 months.  Signed, Satira Sark, MD, [facility] 12/16/2021 9:06 AM    [facility] [place], [place], [place] [address] Phone: [phone]; Fax: [phone]

## 2021-12-16 ENCOUNTER — Encounter: Payer: Self-pay | Admitting: Cardiology

## 2021-12-16 ENCOUNTER — Ambulatory Visit: Payer: BC Managed Care – PPO | Attending: Cardiology | Admitting: Cardiology

## 2021-12-16 VITALS — BP 102/64 | HR 70 | Ht 71.0 in | Wt 157.6 lb

## 2021-12-16 DIAGNOSIS — I25119 Atherosclerotic heart disease of native coronary artery with unspecified angina pectoris: Secondary | ICD-10-CM

## 2021-12-16 DIAGNOSIS — E782 Mixed hyperlipidemia: Secondary | ICD-10-CM

## 2021-12-16 DIAGNOSIS — I255 Ischemic cardiomyopathy: Secondary | ICD-10-CM

## 2021-12-16 DIAGNOSIS — Z79899 Other long term (current) drug therapy: Secondary | ICD-10-CM | POA: Diagnosis not present

## 2021-12-16 MED ORDER — CLOPIDOGREL BISULFATE 75 MG PO TABS: 75.0000 mg | ORAL_TABLET | Freq: Every day | ORAL | 2 refills | Status: DC

## 2021-12-16 MED ORDER — ENTRESTO 24-26 MG PO TABS: ORAL_TABLET | ORAL | 3 refills | Status: DC

## 2021-12-16 MED ORDER — ATORVASTATIN CALCIUM 80 MG PO TABS: 80.0000 mg | ORAL_TABLET | Freq: Every day | ORAL | 3 refills | Status: DC

## 2021-12-16 MED ORDER — NITROGLYCERIN 0.4 MG SL SUBL: 0.4000 mg | SUBLINGUAL_TABLET | SUBLINGUAL | 3 refills | Status: AC | PRN

## 2021-12-16 MED ORDER — CARVEDILOL 3.125 MG PO TABS: 3.1250 mg | ORAL_TABLET | Freq: Two times a day (BID) | ORAL | 3 refills | Status: DC

## 2021-12-16 NOTE — Patient Instructions (Addendum)
Medication Instructions:  Your physician recommends that you continue on your current medications as directed. Please refer to the Current Medication list given to you today.  Labwork: Your physician recommends that you return for a FASTING lipid & CMET done within the next week. Please do not eat or drink for at least 8 hours when you have this done. You may take your medications that morning with a sip of water. [facility]  Testing/Procedures: none  Follow-Up: Your physician recommends that you schedule a follow-up appointment in: 6 months  Any Other Special Instructions Will Be Listed Below (If Applicable).  If you need a refill on your cardiac medications before your next appointment, please call your pharmacy.

## 2021-12-23 DIAGNOSIS — Z79899 Other long term (current) drug therapy: Secondary | ICD-10-CM | POA: Diagnosis not present

## 2021-12-23 DIAGNOSIS — I255 Ischemic cardiomyopathy: Secondary | ICD-10-CM | POA: Diagnosis not present

## 2021-12-23 DIAGNOSIS — E782 Mixed hyperlipidemia: Secondary | ICD-10-CM | POA: Diagnosis not present

## 2021-12-24 ENCOUNTER — Other Ambulatory Visit: Payer: Self-pay | Admitting: Cardiology

## 2021-12-24 LAB — COMPREHENSIVE METABOLIC PANEL
ALT: 12 IU/L (ref 0–44)
AST: 13 IU/L (ref 0–40)
Albumin/Globulin Ratio: 1.9 (ref 1.2–2.2)
Albumin: 4.3 g/dL (ref 3.8–4.9)
Alkaline Phosphatase: 75 IU/L (ref 44–121)
BUN/Creatinine Ratio: 13 (ref 9–20)
BUN: 11 mg/dL (ref 6–24)
Bilirubin Total: 0.4 mg/dL (ref 0.0–1.2)
CO2: 20 mmol/L (ref 20–29)
Calcium: 9.8 mg/dL (ref 8.7–10.2)
Chloride: 105 mmol/L (ref 96–106)
Creatinine, Ser: 0.85 mg/dL (ref 0.76–1.27)
Globulin, Total: 2.3 g/dL (ref 1.5–4.5)
Glucose: 101 mg/dL — ABNORMAL HIGH (ref 70–99)
Potassium: 4.9 mmol/L (ref 3.5–5.2)
Sodium: 141 mmol/L (ref 134–144)
Total Protein: 6.6 g/dL (ref 6.0–8.5)
eGFR: 101 mL/min/{1.73_m2} (ref >59)

## 2021-12-24 LAB — LIPID PANEL
Chol/HDL Ratio: 3.6 ratio (ref 0.0–5.0)
Cholesterol, Total: 127 mg/dL (ref 100–199)
HDL: 35 mg/dL — ABNORMAL LOW (ref >39)
LDL Chol Calc (NIH): 73 mg/dL (ref 0–99)
Triglycerides: 104 mg/dL (ref 0–149)
VLDL Cholesterol Cal: 19 mg/dL (ref 5–40)

## 2021-12-30 ENCOUNTER — Other Ambulatory Visit: Payer: Self-pay | Admitting: Cardiology

## 2021-12-30 MED ORDER — PANTOPRAZOLE SODIUM 40 MG PO TBEC: 40.0000 mg | DELAYED_RELEASE_TABLET | Freq: Every day | ORAL | 6 refills | Status: DC

## 2021-12-30 MED ORDER — BUPROPION HCL ER (SR) 150 MG PO TB12: 150.0000 mg | ORAL_TABLET | Freq: Two times a day (BID) | ORAL | 0 refills | Status: AC

## 2021-12-30 NOTE — Telephone Encounter (Signed)
*  STAT* If patient is at the pharmacy, call can be transferred to refill team.   1. Which medications need to be refilled? (please list name of each medication and dose if known)   pantoprazole (PROTONIX) 40 MG tablet buPROPion (WELLBUTRIN SR) 150 MG 12 hr tablet  2. Which pharmacy/location (including street and city if local pharmacy) is medication to be sent to?  [place], [place]  3. Do they need a 30 day or 90 day supply?  90 day  Patient stated he is completely out of these medication and has been out for a week.

## 2022-01-13 ENCOUNTER — Other Ambulatory Visit: Payer: Self-pay | Admitting: Cardiology

## 2022-01-20 ENCOUNTER — Encounter: Payer: Self-pay | Admitting: Cardiology

## 2022-01-20 ENCOUNTER — Other Ambulatory Visit: Payer: Self-pay | Admitting: Cardiology

## 2022-01-20 NOTE — Telephone Encounter (Signed)
Error

## 2022-01-26 ENCOUNTER — Other Ambulatory Visit: Payer: Self-pay | Admitting: Cardiology

## 2022-02-10 ENCOUNTER — Other Ambulatory Visit: Payer: Self-pay | Admitting: Cardiology

## 2022-02-14 ENCOUNTER — Ambulatory Visit (HOSPITAL_COMMUNITY): Payer: BC Managed Care – PPO

## 2022-02-24 ENCOUNTER — Encounter (HOSPITAL_COMMUNITY): Payer: Self-pay

## 2022-02-24 ENCOUNTER — Emergency Department (HOSPITAL_COMMUNITY)
Admission: EM | Admit: 2022-02-24 | Discharge: 2022-02-24 | Disposition: A | Discharge status: Home / Self Care | Payer: BC Managed Care – PPO | Attending: Emergency Medicine | Admitting: Emergency Medicine

## 2022-02-24 ENCOUNTER — Emergency Department (HOSPITAL_COMMUNITY): Payer: BC Managed Care – PPO

## 2022-02-24 ENCOUNTER — Other Ambulatory Visit: Payer: Self-pay

## 2022-02-24 DIAGNOSIS — R079 Chest pain, unspecified: Secondary | ICD-10-CM | POA: Insufficient documentation

## 2022-02-24 DIAGNOSIS — I2510 Atherosclerotic heart disease of native coronary artery without angina pectoris: Secondary | ICD-10-CM | POA: Insufficient documentation

## 2022-02-24 DIAGNOSIS — Z7982 Long term (current) use of aspirin: Secondary | ICD-10-CM | POA: Insufficient documentation

## 2022-02-24 DIAGNOSIS — R0789 Other chest pain: Secondary | ICD-10-CM | POA: Diagnosis not present

## 2022-02-24 DIAGNOSIS — F17200 Nicotine dependence, unspecified, uncomplicated: Secondary | ICD-10-CM | POA: Diagnosis not present

## 2022-02-24 DIAGNOSIS — I509 Heart failure, unspecified: Secondary | ICD-10-CM | POA: Diagnosis not present

## 2022-02-24 DIAGNOSIS — Z7902 Long term (current) use of antithrombotics/antiplatelets: Secondary | ICD-10-CM | POA: Insufficient documentation

## 2022-02-24 LAB — CBC
HCT: 44.3 % (ref 39.0–52.0)
Hemoglobin: 14.9 g/dL (ref 13.0–17.0)
MCH: 29.4 pg (ref 26.0–34.0)
MCHC: 33.6 g/dL (ref 30.0–36.0)
MCV: 87.4 fL (ref 80.0–100.0)
Platelets: 348 10*3/uL (ref 150–400)
RBC: 5.07 MIL/uL (ref 4.22–5.81)
RDW: 13.4 % (ref 11.5–15.5)
WBC: 9.4 10*3/uL (ref 4.0–10.5)
nRBC: 0 % (ref 0.0–0.2)

## 2022-02-24 LAB — BASIC METABOLIC PANEL
Anion gap: 5 (ref 5–15)
BUN: 15 mg/dL (ref 6–20)
CO2: 22 mmol/L (ref 22–32)
Calcium: 9.1 mg/dL (ref 8.9–10.3)
Chloride: 115 mmol/L — ABNORMAL HIGH (ref 98–111)
Creatinine, Ser: 0.9 mg/dL (ref 0.61–1.24)
GFR, Estimated: >60 mL/min (ref >60)
Glucose, Bld: 114 mg/dL — ABNORMAL HIGH (ref 70–99)
Potassium: 3.9 mmol/L (ref 3.5–5.1)
Sodium: 142 mmol/L (ref 135–145)

## 2022-02-24 LAB — TROPONIN I (HIGH SENSITIVITY)
Troponin I (High Sensitivity): <2 ng/L (ref <18)
Troponin I (High Sensitivity): <2 ng/L (ref <18)

## 2022-02-24 MED ORDER — NITROGLYCERIN 2 % TD OINT
1.0000 [in_us] | TOPICAL_OINTMENT | Freq: Once | TRANSDERMAL | Status: AC
  Administered 2022-02-24: 1 [in_us] via TOPICAL
  Filled 2022-02-24: qty 1

## 2022-02-24 MED ORDER — SODIUM CHLORIDE 0.9 % IV BOLUS
500.0000 mL | Freq: Once | INTRAVENOUS | Status: AC
  Administered 2022-02-24: 500 mL via INTRAVENOUS

## 2022-02-24 MED ORDER — ASPIRIN 81 MG PO CHEW
324.0000 mg | CHEWABLE_TABLET | Freq: Once | ORAL | Status: AC
  Administered 2022-02-24: 324 mg via ORAL
  Filled 2022-02-24: qty 4

## 2022-02-24 NOTE — Discharge Instructions (Signed)
If you develop recurrent, continued, or worsening chest pain, shortness of breath, fever, vomiting, abdominal or back pain, or any other new/concerning symptoms then return to the ER for evaluation.  

## 2022-02-24 NOTE — ED Triage Notes (Signed)
Pt presents with sudden onset of chest pain that started 10 min PTA while he was riding in the car. Pt describes it as an "electric shooting pain." Radiates to L arm. Pt reports associated ShOB. Denies nausea or diaphoresisPt took 1 NTG PTA with some temporary relief.

## 2022-02-24 NOTE — ED Notes (Signed)
  Patient ambulated to restroom and back with no assistance. No dizziness or altered gait.  Dr. Criss Alvine notified.

## 2022-02-24 NOTE — ED Provider Notes (Signed)
[facility] EMERGENCY DEPARTMENT Provider Note   CSN: [id number] Arrival date & time: 02/24/22  1611     History  Chief Complaint  Patient presents with   Chest Pain    [name] is a 56 y.o. male.  HPI 56 year old male with a history of coronary artery disease, CHF, hyperlipidemia, smoking presents with chest pain.  Started about 30 minutes prior to arrival.  He was in the car when he developed sudden chest pain.  Feels like an electric sensation in the left anterior chest.  It radiates up to his left shoulder.  He is having a little bit of dyspnea.  No leg swelling or back pain.  The pain was worse earlier, about an 8 out of 10 and he took a nitroglycerin which brought it down to a 5.  This is similar to previous cardiac type chest pain he had.  Home Medications Prior to Admission medications   Medication Sig Start Date End Date Taking? Authorizing Provider  albuterol (VENTOLIN HFA) 108 (90 Base) MCG/ACT inhaler Inhale 2 puffs into the lungs every 4 (four) hours as needed for wheezing or shortness of breath. 03/07/21   Jonelle Sidle, MD  aspirin EC 81 MG tablet Take 1 tablet (81 mg total) by mouth daily. 06/29/20   Netta Neat., NP  atorvastatin (LIPITOR) 80 MG tablet Take 1 tablet (80 mg total) by mouth daily. 12/16/21   Jonelle Sidle, MD  buPROPion (WELLBUTRIN SR) 150 MG 12 hr tablet Take 1 tablet (150 mg total) by mouth 2 (two) times daily. 12/30/21   Laurey Morale, MD  carvedilol (COREG) 3.125 MG tablet Take 1 tablet (3.125 mg total) by mouth 2 (two) times daily with a meal. 12/16/21   Jonelle Sidle, MD  clopidogrel (PLAVIX) 75 MG tablet Take 1 tablet (75 mg total) by mouth daily. 12/16/21   Jonelle Sidle, MD  dapagliflozin propanediol (FARXIGA) 10 MG TABS tablet Take 1 tablet by mouth once daily 02/10/22   Jonelle Sidle, MD  fexofenadine (ALLEGRA) 180 MG tablet Take 180 mg by mouth daily.    [provider]  fluticasone (FLONASE)  50 MCG/ACT nasal spray Place 1-2 sprays into both nostrils daily as needed for allergies. 12/24/20   [provider]  HYDROcodone-acetaminophen (NORCO/VICODIN) 5-325 MG tablet Take 1 tablet by mouth every 6 (six) hours as needed for moderate pain. 07/04/21   Vickki Hearing, MD  ibuprofen (ADVIL) 800 MG tablet Take 1 tablet (800 mg total) by mouth every 8 (eight) hours as needed. 07/04/21   Vickki Hearing, MD  nitroGLYCERIN (NITROSTAT) 0.4 MG SL tablet Place 1 tablet (0.4 mg total) under the tongue every 5 (five) minutes x 3 doses as needed (if no relief after 2nd dose, proceed to ED or call 911). 12/16/21   Jonelle Sidle, MD  pantoprazole (PROTONIX) 40 MG tablet Take 1 tablet (40 mg total) by mouth daily. 12/30/21   Jonelle Sidle, MD  sacubitril-valsartan (ENTRESTO) 24-26 MG TAKE 1 TABLET BY MOUTH TWICE DAILY DISCONTINUE  LOSARTAN 12/16/21   Jonelle Sidle, MD  spironolactone (ALDACTONE) 25 MG tablet TAKE 1 TABLET BY MOUTH ONCE DAILY . APPOINTMENT REQUIRED FOR FUTURE REFILLS 01/13/22   Laurey Morale, MD      Allergies    Patient has no known allergies.    Review of Systems   Review of Systems  Respiratory:  Positive for shortness of breath. Negative for cough.   Cardiovascular:  Positive for chest pain. Negative for leg swelling.  Musculoskeletal:  Negative for back pain.    Physical Exam Updated Vital Signs BP 101/71 (BP Location: Left Arm)   Pulse 61   Temp 98.1 F (36.7 C) (Oral)   Resp 18   Ht 5\' 11"  (1.803 m)   Wt 72.6 kg   SpO2 99%   BMI 22.32 kg/m  Physical Exam Vitals and nursing note reviewed.  Constitutional:      General: He is not in acute distress.    Appearance: He is well-developed. He is not ill-appearing or diaphoretic.  HENT:     Head: Normocephalic and atraumatic.  Cardiovascular:     Rate and Rhythm: Normal rate and regular rhythm.     Pulses:          Radial pulses are 2+ on the right side.     Heart sounds: Normal heart  sounds.  Pulmonary:     Effort: Pulmonary effort is normal.     Breath sounds: Normal breath sounds.  Abdominal:     Palpations: Abdomen is soft.     Tenderness: There is no abdominal tenderness.  Musculoskeletal:     Right lower leg: No edema.     Left lower leg: No edema.  Skin:    General: Skin is warm and dry.  Neurological:     Mental Status: He is alert.     ED Results / Procedures / Treatments   Labs (all labs ordered are listed, but only abnormal results are displayed) Labs Reviewed  BASIC METABOLIC PANEL - Abnormal; Notable for the following components:      Result Value   Chloride 115 (*)    Glucose, Bld 114 (*)    All other components within normal limits  CBC  TROPONIN I (HIGH SENSITIVITY)  TROPONIN I (HIGH SENSITIVITY)    EKG EKG Interpretation  Date/Time:  Monday February 24 2022 17:13:43 EST Ventricular Rate:  76 PR Interval:  158 QRS Duration: 100 QT Interval:  373 QTC Calculation: 420 R Axis:   62 Text Interpretation: Sinus rhythm Ventricular premature complex Anterior infarct, age indeterminate no significant change since earlier in the day Confirmed by [id number] [phone]) on 02/24/2022 5:22:35 PM  Radiology DG Chest 2 View  Result Date: 02/24/2022 CLINICAL DATA:  Chest pain. EXAM: CHEST - 2 VIEW COMPARISON:  10/10/2020. FINDINGS: Cardiac silhouette normal in size and configuration. Left coronary artery stent, stable. Normal mediastinal and hilar contours. Clear lungs.  No pleural effusion or pneumothorax. Skeletal structures are intact. IMPRESSION: No active cardiopulmonary disease. Electronically Signed   By: 12/10/2020 M.D.   On: 02/24/2022 16:47    Procedures Procedures    Medications Ordered in ED Medications  aspirin chewable tablet 324 mg (324 mg Oral Given 02/24/22 1704)  nitroGLYCERIN (NITROGLYN) 2 % ointment 1 inch (1 inch Topical Given 02/24/22 1704)  sodium chloride 0.9 % bolus 500 mL (0 mLs Intravenous Stopped 02/24/22  2049)    ED Course/ Medical Decision Making/ A&P                           Medical Decision Making Amount and/or Complexity of Data Reviewed External Data Reviewed: notes.    Details: Troponins negative Labs: ordered.    Details: Troponins negative x 2. Radiology: ordered and independent interpretation performed.    Details: No CHF ECG/medicine tests: ordered and independent interpretation performed.    Details: No acute ischemia  Risk  OTC drugs. Prescription drug management.   Patient presents with chest pain. EKG unchanged from baseline. No dynamic changes on repeat EKG.  dissection or PE.  x 2.  Doubt given his complex history and previous CAD with stent placement I discussed with cardiology, Dr. Lalla Brothers.  Given totally undetectable troponins x 2, he feels comfortable with patient going home with close outpatient cards follow-up.  Patient is okay with this as well.  He was given Nitropaste which slowly did seem to work but also softly dropped his BP so he was given a small bolus of fluids.  Vitals are now normal.  At this point, he is okay going home and I think stable for discharge home with return precautions.        Final Clinical Impression(s) / ED Diagnoses Final diagnoses:  Nonspecific chest pain    Rx / DC Orders ED Discharge Orders          Ordered    Ambulatory referral to Cardiology       Comments: If you have not heard from the Cardiology office within the next 72 hours please call [phone].   02/24/22 6415              Pricilla Loveless, MD 02/24/22 2353

## 2022-03-11 NOTE — Progress Notes (Unsigned)
Cardiology Clinic Note   Patient Name: [name] Date of Encounter: 03/11/2022  Primary Care Provider:  Pcp, No Primary Cardiologist:  Rozann Lesches, MD  Patient Profile    57 year old male with a past medical history of coronary artery disease, congestive heart failure, hyperlipidemia, peripheral arterial disease, former smoker, who is here for follow-up after a recent visit to the emergency department for chest pain.  Past Medical History    Past Medical History:  Diagnosis Date   Coronary arteriosclerosis    a. PCI LAD 2009 in [place];  b. 12/2010 Cath: LM: nl, LAD: patent stent mid, 25 mid, LCX nl, RCA: non dominant, nl, EF 50%   History of methamphetamine abuse ([place])    a. Used x 6 yrs, quit July 2012   History of tobacco abuse    a. 41 yrs, up to 3 ppd, quit July 2012   Hypercholesterolemia    Past Surgical History:  Procedure Laterality Date   BACK SURGERY     fusion-lumbar   CORONARY ANGIOPLASTY WITH STENT PLACEMENT     CORONARY/GRAFT ACUTE MI REVASCULARIZATION N/A 04/22/2020   Procedure: Coronary/Graft Acute MI Revascularization;  Surgeon: Troy Sine, MD;  Location: [facility];  Service: Cardiovascular;  Laterality: N/A;   LEFT HEART CATH AND CORONARY ANGIOGRAPHY N/A 04/22/2020   Procedure: LEFT HEART CATH AND CORONARY ANGIOGRAPHY;  Surgeon: Troy Sine, MD;  Location: [facility];  Service: Cardiovascular;  Laterality: N/A;    Allergies  No Known Allergies  History of Present Illness    [name] is a 57 year old male with the previously mentioned past medical history of coronary artery disease with PCI to LAD in 2009 repeat cath in 04/2020 with PCI/DES LAD (immediately proximal to the previously placed LAD stent), ischemic cardiomyopathy, HFimpEF last LVEF 45-50%, hyperlipidemia, former methamphetamine abuse, history of tobacco abuse, hypercholesteremia, and peripheral arterial disease.   Presented as a STEMI in 04/2020  which led to emergency catheterization and PCI/DES placement of the mid LAD immediately following previously placed stent. Placed on DAPT for 12 months, GDMT for severely decreased function to 25-30%, aggressive lipid lowering therapy and smoking cessation was recommended. Echo with LVEF 25-30%, trivial MR. Long term monitor ordered but never worn. Repeat echo in 08/2020 with improved LVEF 40-45%, G1DD, no valvular abnormalities. Lexiscan stress completed 01/2021 negative or ischemia, intermediate risk scan, inferior changes were moe prominent, LVEF 47%. Echo 07/2021 LVEF 45-50%, G1DD, trivial MR.  He was last seen in clinic 12/16/2021 by Dr. Domenic Polite. At that time he was doine well from the cardiac standpoint. There were no medication changes made and only labs were ordered to follow up on kidney function and electrolytes.   He was evaluated in the emergency department at [facility] 02/24/22 with complaints of chest pain. Describes the pain as similar to his previous cardiac type chest pain. He also had taken a Nitrostat that dropped his chest discomfort from 8 out of 10 to 5 out of 10. Work up was unremarkable and he was discharged home with close cardiology follow-up recommended.   He returns to clinic today    Home Medications    Current Outpatient Medications  Medication Sig Dispense Refill   albuterol (VENTOLIN HFA) 108 (90 Base) MCG/ACT inhaler Inhale 2 puffs into the lungs every 4 (four) hours as needed for wheezing or shortness of breath. 1 each 0   aspirin EC 81 MG tablet Take 1 tablet (81 mg total)  by mouth daily. 90 tablet 3   atorvastatin (LIPITOR) 80 MG tablet Take 1 tablet (80 mg total) by mouth daily. 90 tablet 3   buPROPion (WELLBUTRIN SR) 150 MG 12 hr tablet Take 1 tablet (150 mg total) by mouth 2 (two) times daily. 60 tablet 0   carvedilol (COREG) 3.125 MG tablet Take 1 tablet (3.125 mg total) by mouth 2 (two) times daily with a meal. 180 tablet 3   clopidogrel (PLAVIX) 75 MG  tablet Take 1 tablet (75 mg total) by mouth daily. 90 tablet 2   dapagliflozin propanediol (FARXIGA) 10 MG TABS tablet Take 1 tablet by mouth once daily 90 tablet 1   fexofenadine (ALLEGRA) 180 MG tablet Take 180 mg by mouth daily.     fluticasone (FLONASE) 50 MCG/ACT nasal spray Place 1-2 sprays into both nostrils daily as needed for allergies.     HYDROcodone-acetaminophen (NORCO/VICODIN) 5-325 MG tablet Take 1 tablet by mouth every 6 (six) hours as needed for moderate pain. 30 tablet 0   ibuprofen (ADVIL) 800 MG tablet Take 1 tablet (800 mg total) by mouth every 8 (eight) hours as needed. 90 tablet 1   nitroGLYCERIN (NITROSTAT) 0.4 MG SL tablet Place 1 tablet (0.4 mg total) under the tongue every 5 (five) minutes x 3 doses as needed (if no relief after 2nd dose, proceed to ED or call 911). 25 tablet 3   pantoprazole (PROTONIX) 40 MG tablet Take 1 tablet (40 mg total) by mouth daily. 30 tablet 6   sacubitril-valsartan (ENTRESTO) 24-26 MG TAKE 1 TABLET BY MOUTH TWICE DAILY DISCONTINUE  LOSARTAN 180 tablet 3   spironolactone (ALDACTONE) 25 MG tablet TAKE 1 TABLET BY MOUTH ONCE DAILY . APPOINTMENT REQUIRED FOR FUTURE REFILLS 60 tablet 0   No current facility-administered medications for this visit.     Family History    Family History  Problem Relation Age of Onset   Heart attack Father        died @ 35   Coronary artery disease Mother        alive in late 75's   He indicated that the status of his mother is unknown and reported the following: unklnown. He indicated that his father is deceased. He indicated that the status of his brother is unknown and reported the following: 13 brothers and sisters - doesn't know their health history.  Social History    Social History   Socioeconomic History   Marital status: Married    Spouse name: Troyce Gieske   Number of children: Not on file   Years of education: Not on file   Highest education level: Not on file  Occupational History   Not  on file  Tobacco Use   Smoking status: Every Day    Packs/day: 0.50    Years: 51.00    Total pack years: 25.50    Types: Cigarettes    Passive exposure: Never   Smokeless tobacco: Never   Tobacco comments:    Currently 6-8 cigs per day  Vaping Use   Vaping Use: Never used  Substance and Sexual Activity   Alcohol use: Yes    Comment: rare   Drug use: Not Currently    Types: Methamphetamines    Comment: previously used methamphetamines - quit 09/2010   Sexual activity: Yes    Partners: Female    Birth control/protection: None  Other Topics Concern   Not on file  Social History Narrative   Lives in [place].  Step-dtr near-by.  Works for a [employer].   Social Determinants of Health   Financial Resource Strain: High Risk (04/27/2020)   Overall Financial Resource Strain (CARDIA)    Difficulty of Paying Living Expenses: Very hard  Food Insecurity: Food Insecurity Present (04/25/2020)   Hunger Vital Sign    Worried About Running Out of Food in the Last Year: Sometimes true    Ran Out of Food in the Last Year: Sometimes true  Transportation Needs: No Transportation Needs (04/25/2020)   PRAPARE - Administrator, Civil Service (Medical): No    Lack of Transportation (Non-Medical): No  Physical Activity: Insufficiently Active (04/25/2020)   Exercise Vital Sign    Days of Exercise per Week: 3 days    Minutes of Exercise per Session: 20 min  Stress: Not on file  Social Connections: Not on file  Intimate Partner Violence: Not At Risk (04/25/2020)   Humiliation, Afraid, Rape, and Kick questionnaire    Fear of Current or Ex-Partner: No    Emotionally Abused: No    Physically Abused: No    Sexually Abused: No     Review of Systems    General:  No chills, fever, night sweats or weight changes.  Cardiovascular:  No chest pain, dyspnea on exertion, edema, orthopnea, palpitations, paroxysmal nocturnal dyspnea. Dermatological: No rash, lesions/masses Respiratory: No cough,  dyspnea Urologic: No hematuria, dysuria Abdominal:   No nausea, vomiting, diarrhea, bright red blood per rectum, melena, or hematemesis Neurologic:  No visual changes, wkns, changes in mental status. All other systems reviewed and are otherwise negative except as noted above.     Physical Exam    VS:  There were no vitals taken for this visit. , BMI There is no height or weight on file to calculate BMI.     GEN: Well nourished, well developed, in no acute distress. HEENT: normal. Neck: Supple, no JVD, carotid bruits, or masses. Cardiac: RRR, no murmurs, rubs, or gallops. No clubbing, cyanosis, edema.  Radials 2+/PT 2+ and equal bilaterally.  Respiratory:  Respirations regular and unlabored, clear to auscultation bilaterally. GI: Soft, nontender, nondistended, BS + x 4. MS: no deformity or atrophy. Skin: warm and dry, no rash. Neuro:  Strength and sensation are intact. Psych: Normal affect.  Accessory Clinical Findings    ECG personally reviewed by me today- *** - No acute changes  Lab Results  Component Value Date   WBC 9.4 02/24/2022   HGB 14.9 02/24/2022   HCT 44.3 02/24/2022   MCV 87.4 02/24/2022   PLT 348 02/24/2022   Lab Results  Component Value Date   CREATININE 0.90 02/24/2022   BUN 15 02/24/2022   NA 142 02/24/2022   K 3.9 02/24/2022   CL 115 (H) 02/24/2022   CO2 22 02/24/2022   Lab Results  Component Value Date   ALT 12 12/23/2021   AST 13 12/23/2021   ALKPHOS 75 12/23/2021   BILITOT 0.4 12/23/2021   Lab Results  Component Value Date   CHOL 127 12/23/2021   HDL 35 (L) 12/23/2021   LDLCALC 73 12/23/2021   TRIG 104 12/23/2021   CHOLHDL 3.6 12/23/2021    Lab Results  Component Value Date   HGBA1C 5.4 04/24/2020    Assessment & Plan   1.  ***  Mikenna Bunkley, NP 03/11/2022, 12:58 PM

## 2022-03-13 ENCOUNTER — Ambulatory Visit: Payer: BC Managed Care – PPO | Attending: Cardiology | Admitting: Cardiology

## 2022-03-13 ENCOUNTER — Encounter: Payer: Self-pay | Admitting: Cardiology

## 2022-03-13 VITALS — BP 112/72 | HR 80 | Ht 71.0 in | Wt 163.2 lb

## 2022-03-13 DIAGNOSIS — I25119 Atherosclerotic heart disease of native coronary artery with unspecified angina pectoris: Secondary | ICD-10-CM | POA: Diagnosis not present

## 2022-03-13 DIAGNOSIS — I255 Ischemic cardiomyopathy: Secondary | ICD-10-CM

## 2022-03-13 DIAGNOSIS — I739 Peripheral vascular disease, unspecified: Secondary | ICD-10-CM

## 2022-03-13 DIAGNOSIS — E782 Mixed hyperlipidemia: Secondary | ICD-10-CM

## 2022-03-13 DIAGNOSIS — Z720 Tobacco use: Secondary | ICD-10-CM

## 2022-03-13 DIAGNOSIS — R079 Chest pain, unspecified: Secondary | ICD-10-CM

## 2022-03-13 NOTE — Patient Instructions (Signed)
Medication Instructions:  Your physician recommends that you continue on your current medications as directed. Please refer to the Current Medication list given to you today.   Labwork: None  Testing/Procedures: Your physician has requested that you have a lexiscan myoview. For further information please visit [email or web address]. Please follow instruction sheet, as given.   Follow-Up: Follow up with Gerrie Nordmann, NP after your stress test.   Any Other Special Instructions Will Be Listed Below (If Applicable).     If you need a refill on your cardiac medications before your next appointment, please call your pharmacy.

## 2022-03-19 ENCOUNTER — Encounter (HOSPITAL_COMMUNITY): Admission: RE | Admit: 2022-03-19 | Payer: BC Managed Care – PPO | Source: Ambulatory Visit

## 2022-03-19 ENCOUNTER — Encounter (HOSPITAL_COMMUNITY): Payer: BC Managed Care – PPO

## 2022-04-03 ENCOUNTER — Encounter (HOSPITAL_COMMUNITY): Payer: Self-pay

## 2022-04-03 ENCOUNTER — Encounter (HOSPITAL_COMMUNITY): Payer: BC Managed Care – PPO

## 2022-05-01 ENCOUNTER — Encounter: Payer: Self-pay | Admitting: Radiology

## 2022-06-30 NOTE — Progress Notes (Deleted)
    Cardiology Office Note  Date: 06/30/2022   ID: [name], DOB 06-11-1965, MRN [id number]  History of Present Illness: [name] is a 57 y.o. male last seen in January by Ms. Hammock NP, I reviewed the note.  Follow-up Lexiscan Myoview was discussed and ordered at the last visit, however he did not present for this testing.  Physical Exam: VS:  There were no vitals taken for this visit., BMI There is no height or weight on file to calculate BMI.  Wt Readings from Last 3 Encounters:  03/13/22 163 lb 3.2 oz (74 kg)  02/24/22 160 lb (72.6 kg)  12/16/21 157 lb 9.6 oz (71.5 kg)    General: Patient appears comfortable at rest. HEENT: Conjunctiva and lids normal, oropharynx clear with moist mucosa. Neck: Supple, no elevated JVP or carotid bruits, no thyromegaly. Lungs: Clear to auscultation, nonlabored breathing at rest. Cardiac: Regular rate and rhythm, no S3 or significant systolic murmur, no pericardial rub. Abdomen: Soft, nontender, no hepatomegaly, bowel sounds present, no guarding or rebound. Extremities: No pitting edema, distal pulses 2+. Skin: Warm and dry. Musculoskeletal: No kyphosis. Neuropsychiatric: Alert and oriented x3, affect grossly appropriate.  ECG:  An ECG dated 03/13/2022 was personally reviewed today and demonstrated:  Minus rhythm with old anterolateral infarct pattern.  Labwork: 12/23/2021: ALT 12; AST 13 02/24/2022: BUN 15; Creatinine, Ser 0.90; Hemoglobin 14.9; Platelets 348; Potassium 3.9; Sodium 142     Component Value Date/Time   CHOL 127 12/23/2021 1048   TRIG 104 12/23/2021 1048   HDL 35 (L) 12/23/2021 1048   CHOLHDL 3.6 12/23/2021 1048   CHOLHDL 3.6 01/14/2021 0153   VLDL 19 01/14/2021 0153   LDLCALC 73 12/23/2021 1048   Other Studies Reviewed Today:  Echocardiogram 07/02/2021: 1. Left ventricular ejection fraction, by estimation, is 45 to 50%. The  left ventricle has mildly decreased function. The left ventricle  demonstrates  regional wall motion abnormalities (see scoring  diagram/findings for description). Left ventricular  diastolic parameters are consistent with Grade I diastolic dysfunction  (impaired relaxation).   2. Right ventricular systolic function is normal. The right ventricular  size is normal. There is normal pulmonary artery systolic pressure. The  estimated right ventricular systolic pressure is 14.3 mmHg.   3. The mitral valve is grossly normal. Trivial mitral valve  regurgitation.   4. The aortic valve is tricuspid. Aortic valve regurgitation is not  visualized. No aortic stenosis is present. Aortic valve mean gradient  measures 2.0 mmHg.   5. The inferior vena cava is normal in size with greater than 50%  respiratory variability, suggesting right atrial pressure of 3 mmHg.   Assessment and Plan:  1.  CAD status post stent intervention to the LAD in 2009 in [place] with subsequent anterolateral STEMI in February 2022 managed with DES to the mid LAD.  2.  HFmrEF with ischemic cardiomyopathy and LVEF 45 to 50% by echocardiogram in May 2023.  3.  Mixed hyperlipidemia.  Disposition:  Follow up {follow up:15908}  Signed, Jonelle Sidle, M.D., F.A.C.C. [facility] at [facility]

## 2022-07-01 ENCOUNTER — Ambulatory Visit: Payer: BC Managed Care – PPO | Admitting: Cardiology

## 2022-07-01 DIAGNOSIS — I25119 Atherosclerotic heart disease of native coronary artery with unspecified angina pectoris: Secondary | ICD-10-CM

## 2022-07-07 ENCOUNTER — Other Ambulatory Visit: Payer: Self-pay | Admitting: Cardiology

## 2022-07-18 ENCOUNTER — Ambulatory Visit: Payer: BC Managed Care – PPO | Attending: Cardiology | Admitting: Nurse Practitioner

## 2022-07-18 NOTE — Progress Notes (Deleted)
Office Visit    Patient Name: [name] Date of Encounter: 07/18/2022  PCP:  Aviva Kluver   [facility]  Cardiologist:  Nona Dell, MD *** Advanced Practice Provider:  No care team member to display Electrophysiologist:  None  {Press F2 to show EP APP, CHF, sleep or structural heart MD               :[id number]}  { Click here to update then REFRESH NOTE - MD (PCP) or APP (Team Member)  Change PCP Type for MD, Specialty for APP is either Cardiology or Clinical Cardiac Electrophysiology  :[id number]}  Chief Complaint    [name] is a 57 y.o. male with a hx of CAD, PAD, hyperlipidemia, former smoker, history of chest pain, history of tobacco abuse, former methamphetamine abuse, and heart failure with improved ejection fraction, who presents today for 73-month follow-up.  Past Medical History    Past Medical History:  Diagnosis Date   Coronary arteriosclerosis    a. PCI LAD 2009 in [place];  b. 12/2010 Cath: LM: nl, LAD: patent stent mid, 25 mid, LCX nl, RCA: non dominant, nl, EF 50%   History of methamphetamine abuse ([facility])    a. Used x 6 yrs, quit July 2012   History of tobacco abuse    a. 41 yrs, up to 3 ppd, quit July 2012   Hypercholesterolemia    Past Surgical History:  Procedure Laterality Date   BACK SURGERY     fusion-lumbar   CORONARY ANGIOPLASTY WITH STENT PLACEMENT     CORONARY/GRAFT ACUTE MI REVASCULARIZATION N/A 04/22/2020   Procedure: Coronary/Graft Acute MI Revascularization;  Surgeon: Lennette Bihari, MD;  Location: [facility];  Service: Cardiovascular;  Laterality: N/A;   LEFT HEART CATH AND CORONARY ANGIOGRAPHY N/A 04/22/2020   Procedure: LEFT HEART CATH AND CORONARY ANGIOGRAPHY;  Surgeon: Lennette Bihari, MD;  Location: [facility];  Service: Cardiovascular;  Laterality: N/A;    Allergies  No Known Allergies  History of Present Illness    [name] is a 57 y.o. male with a PMH as mentioned  above.  Previous cardiovascular history of CAD with PCI to LAD in 2009.  He underwent cardiac catheterization in 2022, received PCI/drug-eluting stent to LAD.  EF mildly reduced at 45 to 50%.  Presented as STEMI in 2022, underwent cardiac cath and received PCI/drug-eluting stent to mid LAD, placed on DAPT for 12 months.  GDMT was started due to EF of 25 to 30% at that time, aggressive risk factor modification recommended.  Echo report noted below.  Monitor was ordered but never worn.  Repeat echocardiogram in July 2022 showed EF 40 to 45%, grade 1 DD, no valvular abnormalities.  Lexi scan in 2022 was negative for ischemia, was deemed intermediate risk, inferior changes were more prominent, EF mildly reduced at 47%.  Echocardiogram in May 2023 revealed EF 45 to 50%, grade 1 DD, trivial MR.  Had ED visit at [facility] in December 2023 due to chest pain.  It was noted to be similar to his previous cardiac chest pain.  Did take nitroglycerin that helped.  Workup was unremarkable.  Last seen by Frutoso Schatz, NP on March 13, 2022.  He continued to note shortness of breath, denied any recurrent chest discomfort.  Noted some numbness to right shoulder down right arm with his episodes of shortness of breath.  Using his inhaler helped.  Reported compliance with his medications.  Was scheduled for Lexiscan, but appears that this testing was not completed.  Today he presents for 84-month follow-up.  He states EKGs/Labs/Other Studies Reviewed:   The following studies were reviewed today: ***  EKG:  EKG is *** ordered today.  The ekg ordered today demonstrates ***  Recent Labs: 12/23/2021: ALT 12 02/24/2022: BUN 15; Creatinine, Ser 0.90; Hemoglobin 14.9; Platelets 348; Potassium 3.9; Sodium 142  Recent Lipid Panel    Component Value Date/Time   CHOL 127 12/23/2021 1048   TRIG 104 12/23/2021 1048   HDL 35 (L) 12/23/2021 1048   CHOLHDL 3.6 12/23/2021 1048   CHOLHDL 3.6 01/14/2021 0153   VLDL 19  01/14/2021 0153   LDLCALC 73 12/23/2021 1048    Risk Assessment/Calculations:  {Does this patient have ATRIAL FIBRILLATION?:[id number]}  Home Medications   No outpatient medications have been marked as taking for the 07/18/22 encounter (Appointment) with Sharlene Dory, NP.     Review of Systems   ***   All other systems reviewed and are otherwise negative except as noted above.  Physical Exam    VS:  There were no vitals taken for this visit. , BMI There is no height or weight on file to calculate BMI.  Wt Readings from Last 3 Encounters:  03/13/22 163 lb 3.2 oz (74 kg)  02/24/22 160 lb (72.6 kg)  12/16/21 157 lb 9.6 oz (71.5 kg)     GEN: Well nourished, well developed, in no acute distress. HEENT: normal. Neck: Supple, no JVD, carotid bruits, or masses. Cardiac: ***RRR, no murmurs, rubs, or gallops. No clubbing, cyanosis, edema.  ***Radials/PT 2+ and equal bilaterally.  Respiratory:  ***Respirations regular and unlabored, clear to auscultation bilaterally. GI: Soft, nontender, nondistended. MS: No deformity or atrophy. Skin: Warm and dry, no rash. Neuro:  Strength and sensation are intact. Psych: Normal affect.  Assessment & Plan    ***  {Are you ordering a CV Procedure (e.g. stress test, cath, DCCV, TEE, etc)?   Press F2        :[id number]}      Disposition: Follow up {follow up:15908} with Nona Dell, MD or APP.  Signed, Sharlene Dory, NP 07/18/2022, 1:04 PM [facility]

## 2022-08-16 ENCOUNTER — Other Ambulatory Visit: Payer: Self-pay | Admitting: Cardiology

## 2022-09-27 ENCOUNTER — Other Ambulatory Visit: Payer: Self-pay | Admitting: Cardiology

## 2022-10-29 DIAGNOSIS — Z6821 Body mass index (BMI) 21.0-21.9, adult: Secondary | ICD-10-CM | POA: Diagnosis not present

## 2022-10-29 DIAGNOSIS — R030 Elevated blood-pressure reading, without diagnosis of hypertension: Secondary | ICD-10-CM | POA: Diagnosis not present

## 2022-10-29 DIAGNOSIS — S61203A Unspecified open wound of left middle finger without damage to nail, initial encounter: Secondary | ICD-10-CM | POA: Diagnosis not present

## 2022-11-07 ENCOUNTER — Other Ambulatory Visit: Payer: Self-pay | Admitting: Cardiology

## 2022-11-26 IMAGING — DX DG CHEST 2V
3 series · 3 of 3 positions shown · non-contrast
Comparison: 04/22/2020

CLINICAL DATA: chest pain

EXAM:
CHEST - 2 VIEW

[chest pa (1 of 2)]
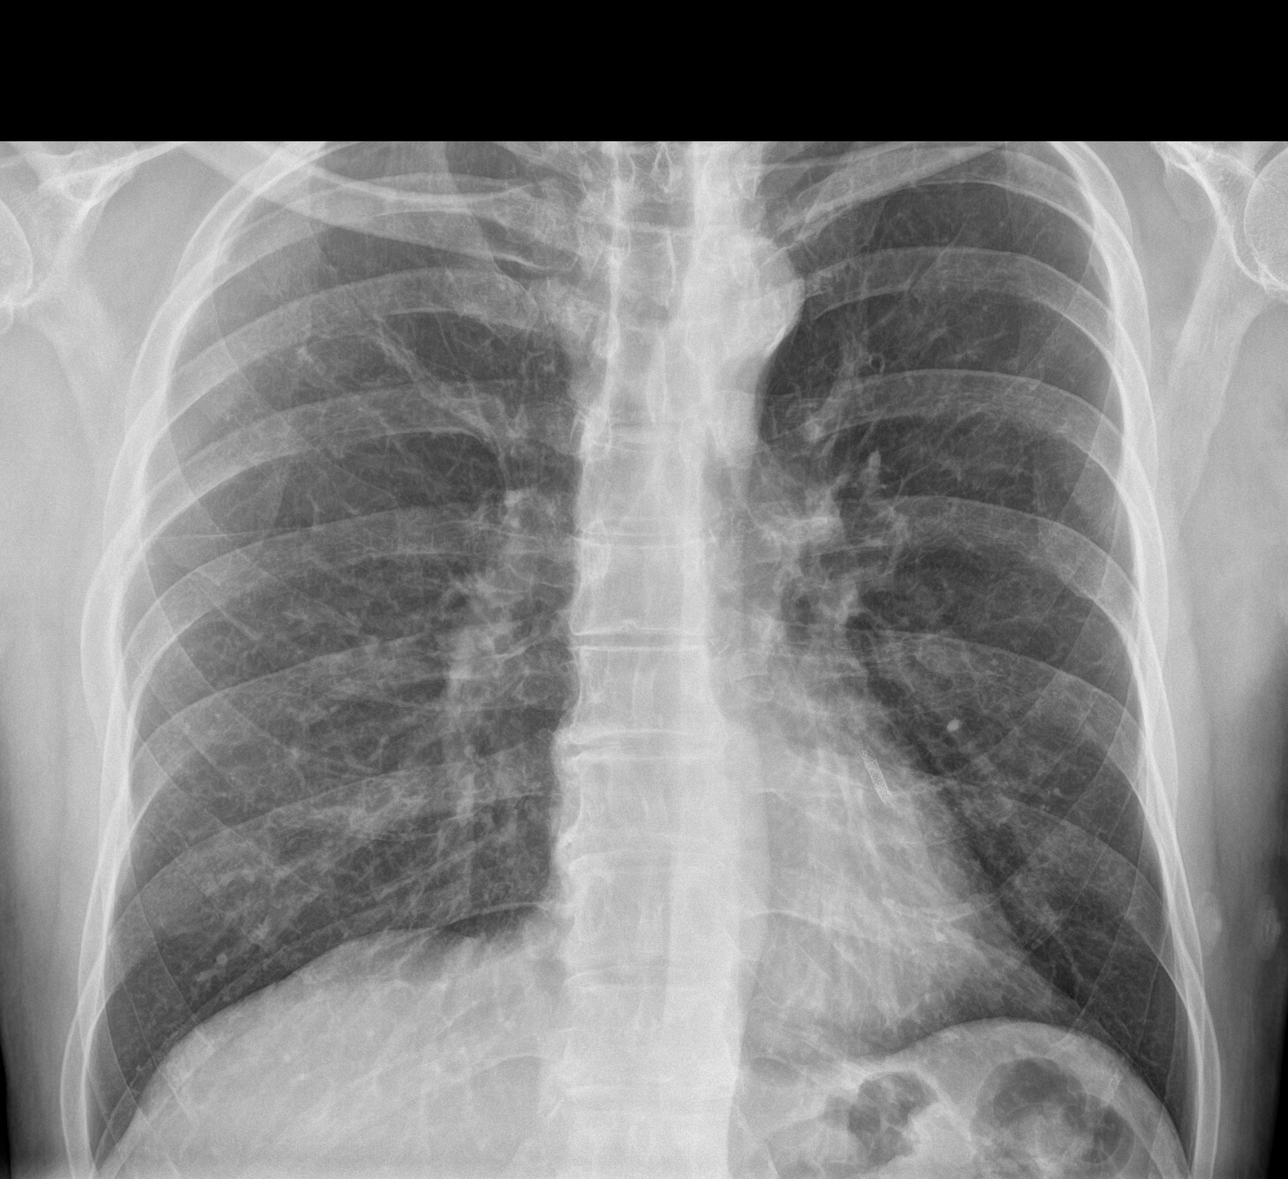

[chest lat]
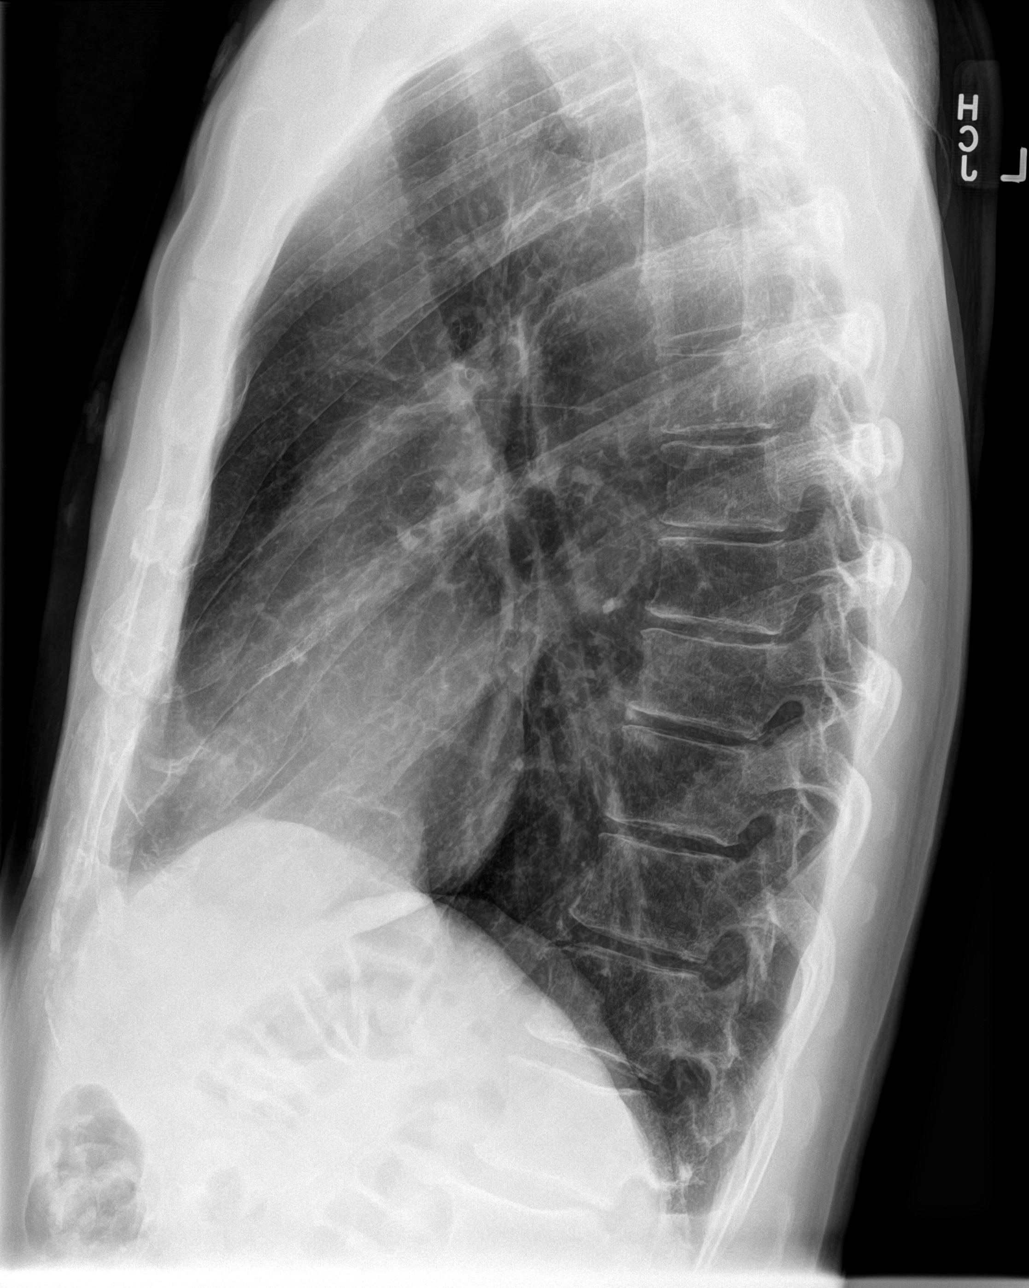

[chest pa (2 of 2)]
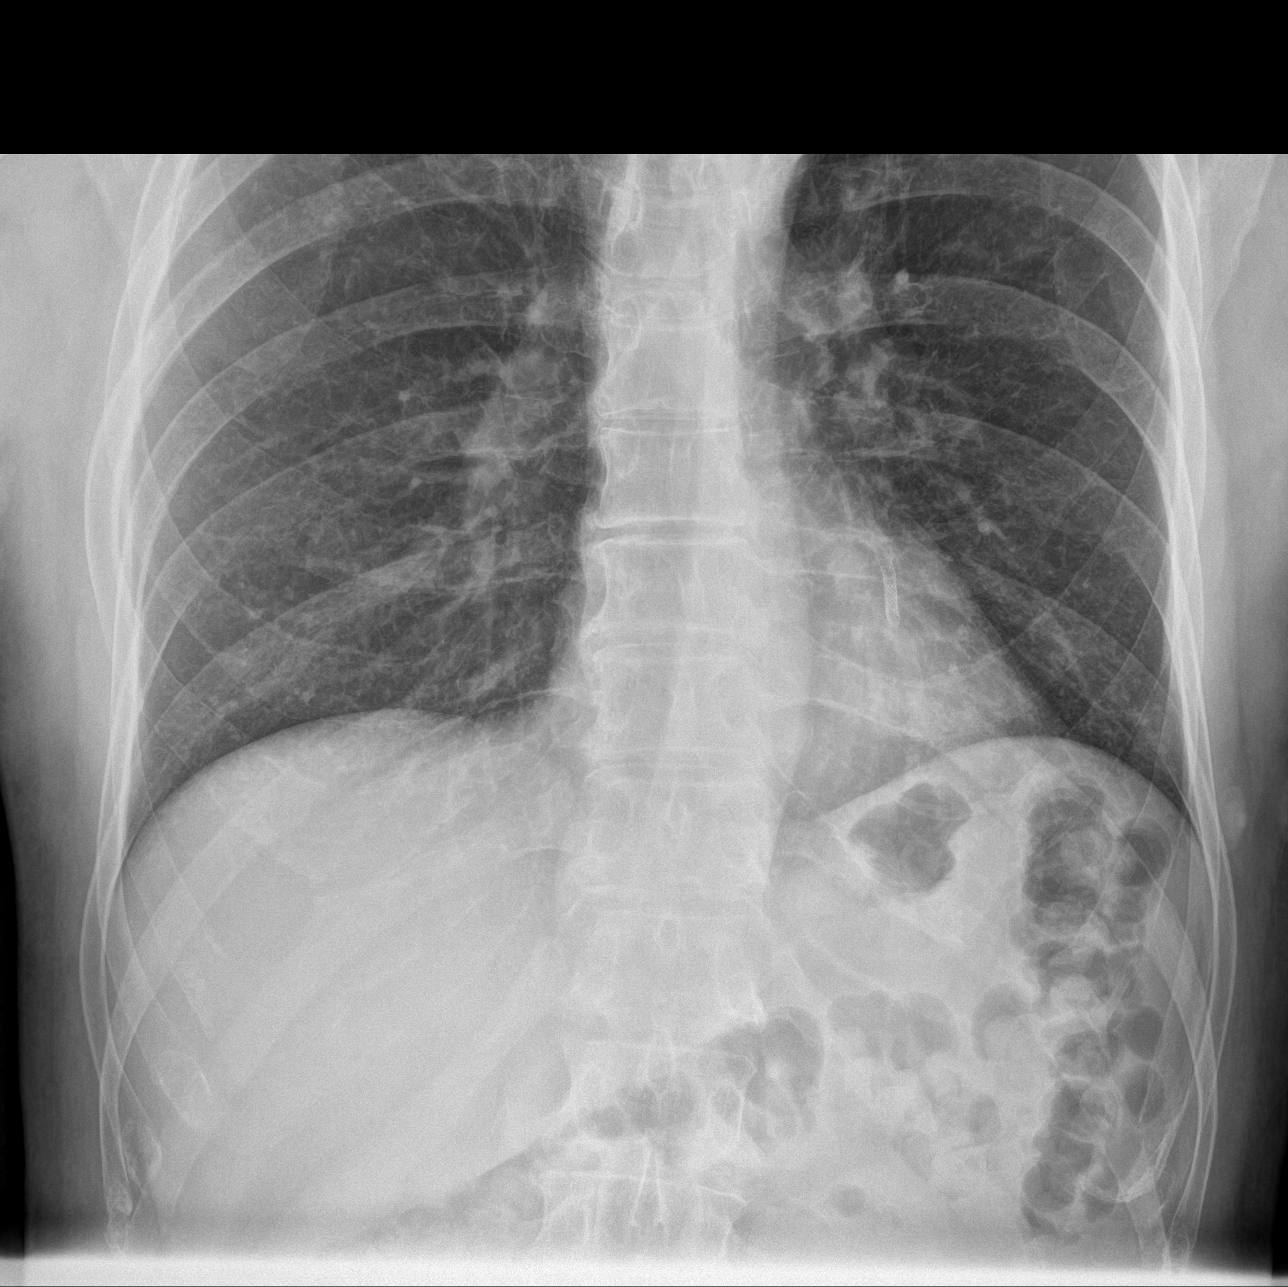

[3 of 3 positions shown; findings below may reference images not displayed]

FINDINGS: Interval placement a coronary stent. The heart size and mediastinal
contours are within normal limits. No pleural effusion or
pneumothorax. No focal consolidative opacity. The visualized
skeletal structures are unremarkable.
IMPRESSION: No active cardiopulmonary disease.

## 2022-12-09 ENCOUNTER — Other Ambulatory Visit: Payer: Self-pay | Admitting: Cardiology

## 2022-12-17 IMAGING — CT CT CHEST LUNG CANCER SCREENING LOW DOSE W/O CM
3 of 5 series · 14 of 36 positions shown, 17 images · non-contrast
Comparison: No priors.

CLINICAL DATA: 50-year-old male current smoker with 100 pack-year
history of smoking. Lung cancer screening examination.

EXAM:
CT CHEST WITHOUT CONTRAST LOW-DOSE FOR LUNG CANCER SCREENING
TECHNIQUE: Multidetector CT imaging of the chest was performed following the
standard protocol without IV contrast.

[Series 2: axial st · axial · 0.66mm/px · z∈[+1309,+1589]mm · 9 of 70 slices shown, 12 images]
[im 7/70  mediastinal]
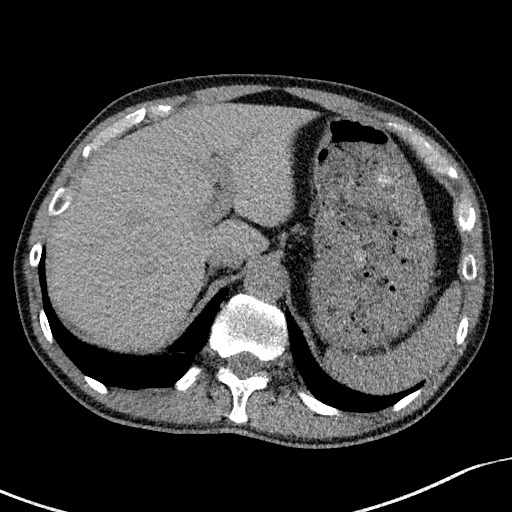
[im 7/70  lung]
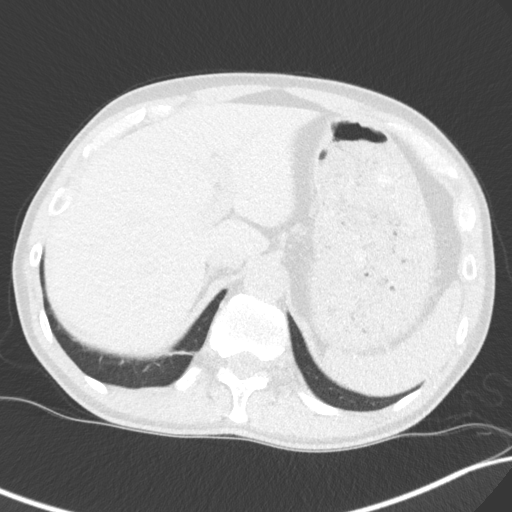
[im 14/70  lung]
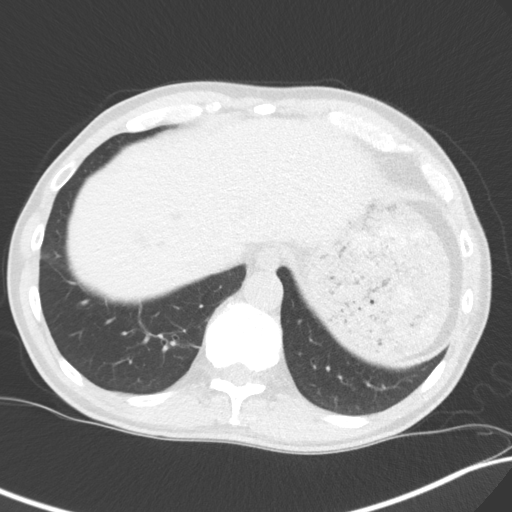
[im 21/70  lung]
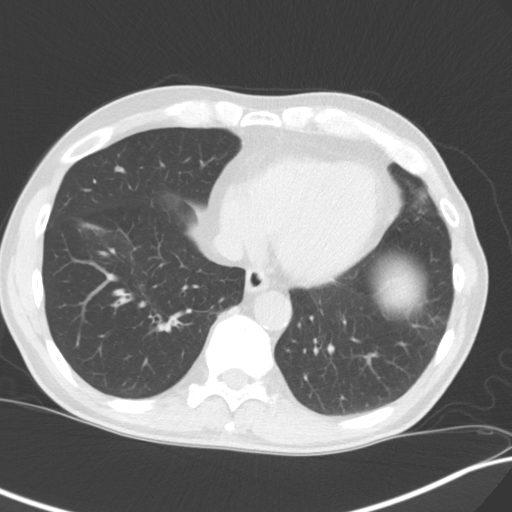
[im 28/70  lung]
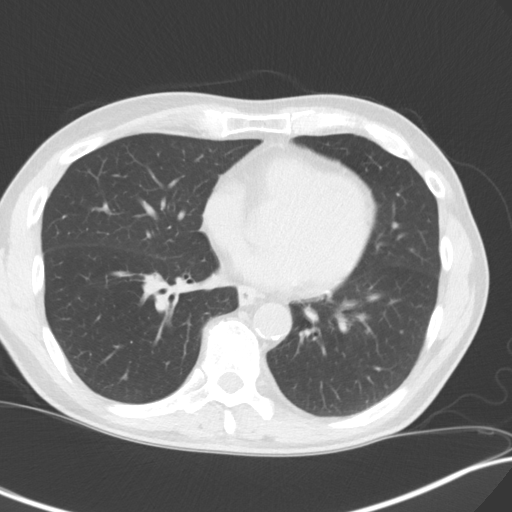
[im 35/70  mediastinal]
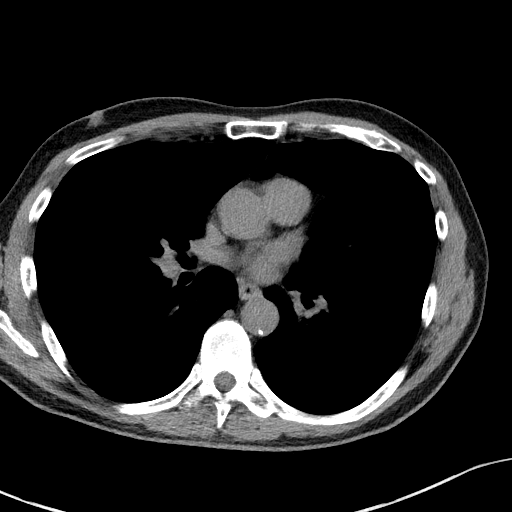
[im 35/70  lung]
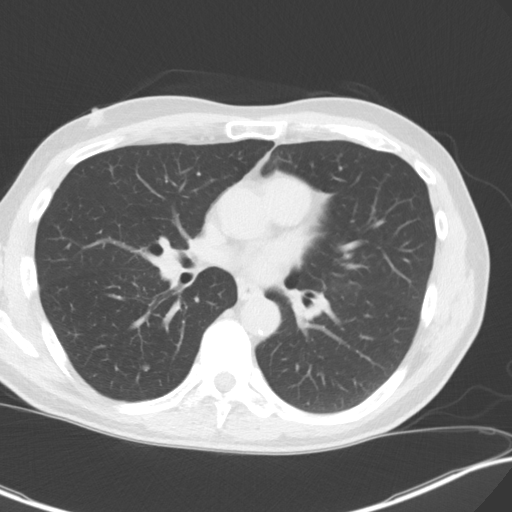
[im 42/70  lung]
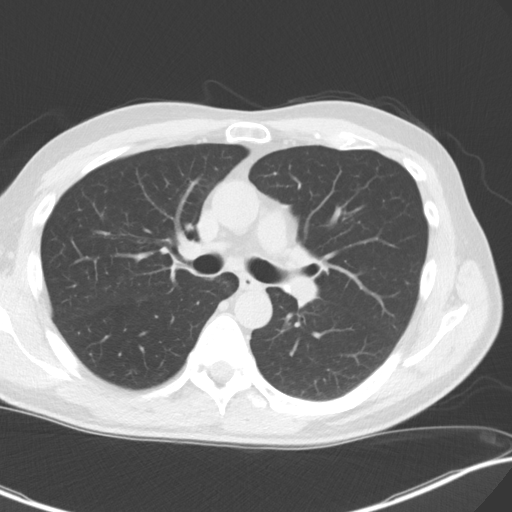
[im 49/70  lung]
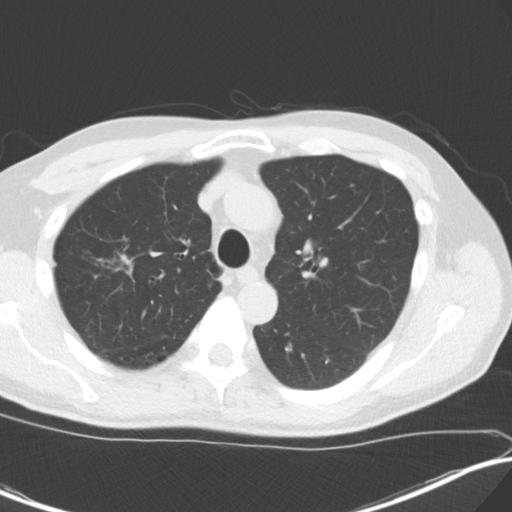
[im 56/70  lung]
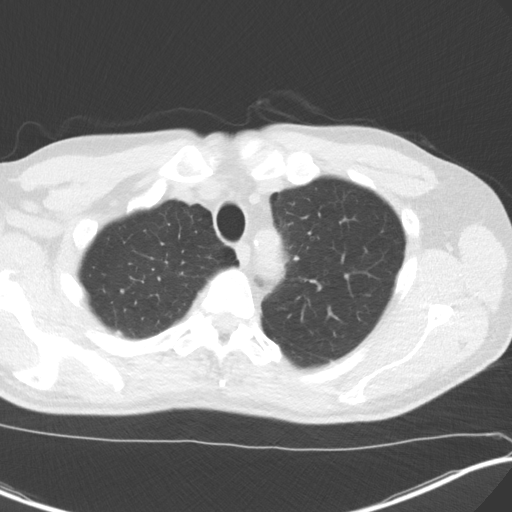
[im 63/70  mediastinal]
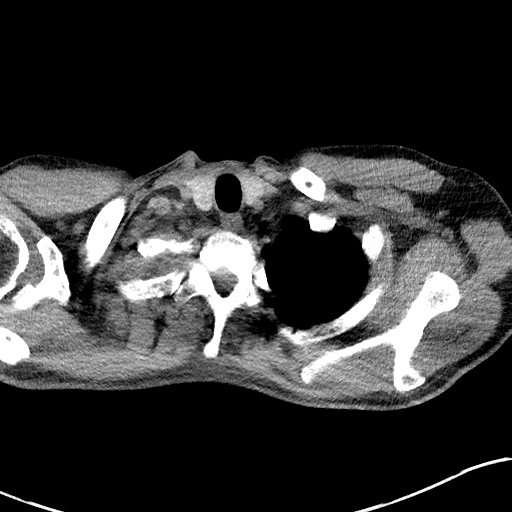
[im 63/70  lung]
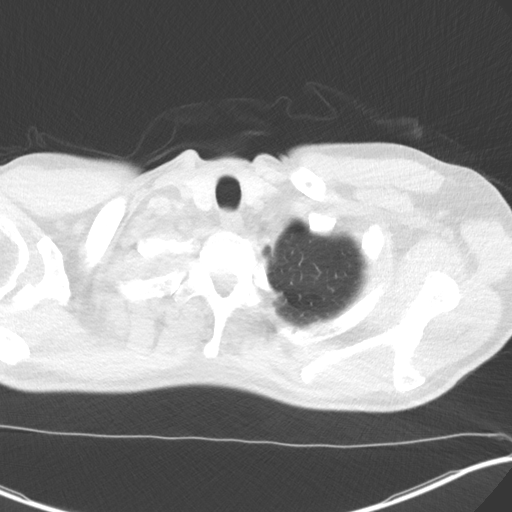

[Series 5: coronal · coronal · 0.66mm/px · 3 of 301 slices shown]
[im 61/301  lung]
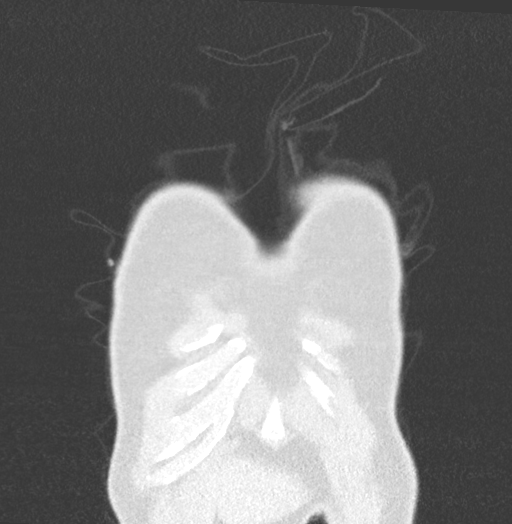
[im 121/301  lung]
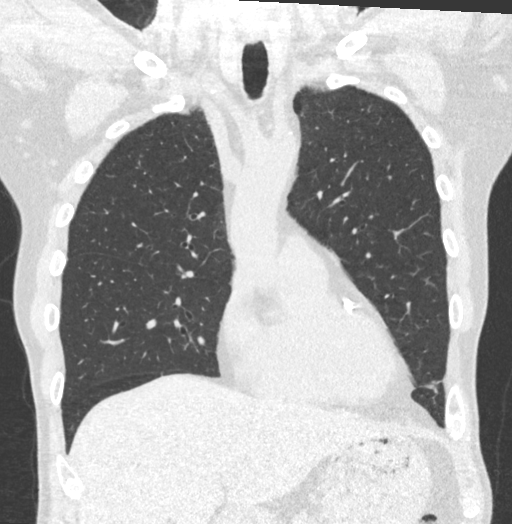
[im 181/301  lung]
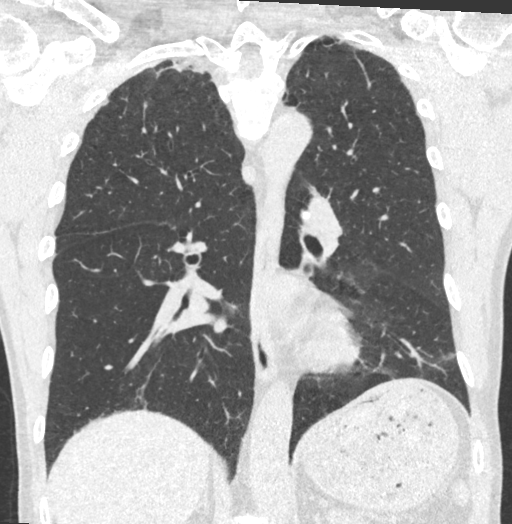

[Series 10: lungs · axial · 0.66mm/px · z∈[+1249,+1263]mm · 2 of 88 slices shown]
[im 7/88  lung]
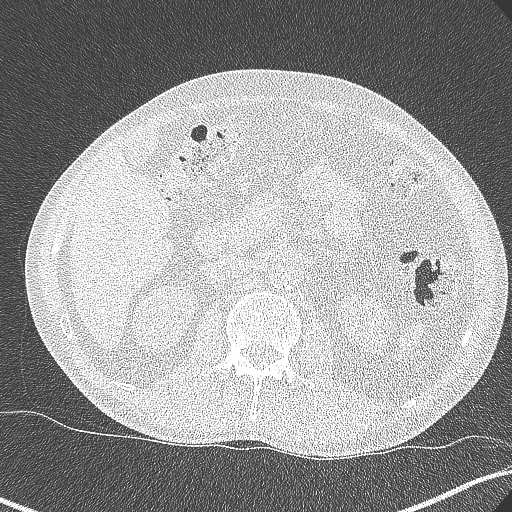
[im 21/88  lung]
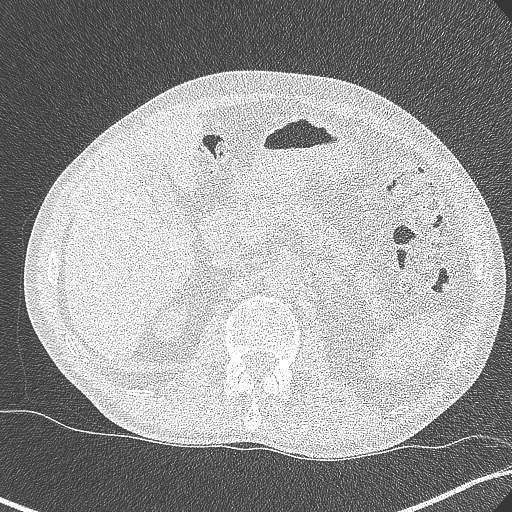

[14 of 36 positions shown; findings below may reference images not displayed]

FINDINGS: Cardiovascular: Heart size is normal. There is no significant
pericardial fluid, thickening or pericardial calcification. There is
aortic atherosclerosis, as well as atherosclerosis of the great
vessels of the mediastinum and the coronary arteries, including
calcified atherosclerotic plaque in the left anterior descending
coronary artery (left anterior descending coronary artery stent is
also noted).

Mediastinum/Nodes: No pathologically enlarged mediastinal or hilar
lymph nodes. Please note that accurate exclusion of hilar adenopathy
is limited on noncontrast CT scans. Esophagus is unremarkable in
appearance. No axillary lymphadenopathy.

Lungs/Pleura: Multiple pulmonary nodules are noted throughout the
lungs bilaterally, largest of which is a smoothly marginated nodule
in the anterior aspect of the left lower lobe abutting the major
fissure (axial image 164 of series 3), with a volume derived mean
diameter of 8.9 mm. No acute consolidative airspace disease. No
pleural effusions. Diffuse bronchial wall thickening with mild
centrilobular and paraseptal emphysema. Nodular areas of
pleuroparenchymal thickening and architectural distortion are noted
in the apices of the lungs bilaterally, most compatible with areas
of chronic post infectious or inflammatory scarring.

Upper Abdomen: Aortic atherosclerosis. Multiple low-attenuation
lesions are again noted scattered throughout the hepatic parenchyma,
measuring up to 1.3 cm in size in segment 8, incompletely
characterized on today's non-contrast CT examination, but similar to
the prior study and statistically likely to represent cysts.

Musculoskeletal: There are no aggressive appearing lytic or blastic
lesions noted in the visualized portions of the skeleton.
IMPRESSION: 1. Smoothly marginated nodule in the anterior aspect of the left
lower lobe with a mean diameter of 8.9 mm categorized as Lung-RADS
4AS, suspicious. Follow up low-dose chest CT without contrast in 3
months (please use the following order, "CT CHEST LCS NODULE
FOLLOW-UP W/O CM") is recommended. Alternatively, PET may be
considered when there is a solid component 8mm or larger.
2. The "S" modifier above refers to potentially clinically
significant non lung cancer related findings. Specifically, there is
aortic atherosclerosis, in addition to left anterior descending
coronary artery disease. Please note that although the presence of
coronary artery calcium documents the presence of coronary artery
disease, the severity of this disease and any potential stenosis
cannot be assessed on this non-gated CT examination. Assessment for
potential risk factor modification, dietary therapy or pharmacologic
therapy may be warranted, if clinically indicated.
3. Mild diffuse bronchial wall thickening with mild centrilobular
and paraseptal emphysema; imaging findings suggestive of underlying
COPD.

These results will be called to the ordering clinician or
representative by the Radiologist Assistant, and communication
documented in the PACS or [REDACTED].

Aortic Atherosclerosis ([id number]) and Emphysema ([id number]).

## 2022-12-21 ENCOUNTER — Other Ambulatory Visit: Payer: Self-pay | Admitting: Cardiology

## 2023-01-04 ENCOUNTER — Other Ambulatory Visit: Payer: Self-pay | Admitting: Cardiology

## 2023-01-13 ENCOUNTER — Other Ambulatory Visit: Payer: Self-pay | Admitting: Cardiology

## 2023-01-18 ENCOUNTER — Other Ambulatory Visit: Payer: Self-pay | Admitting: Cardiology

## 2023-02-01 ENCOUNTER — Other Ambulatory Visit: Payer: Self-pay | Admitting: Cardiology

## 2023-02-05 ENCOUNTER — Other Ambulatory Visit: Payer: Self-pay | Admitting: Cardiology

## 2023-02-13 ENCOUNTER — Other Ambulatory Visit: Payer: Self-pay | Admitting: Cardiology

## 2023-02-14 ENCOUNTER — Other Ambulatory Visit: Payer: Self-pay | Admitting: Cardiology

## 2023-03-01 IMAGING — CT CT ABD-PELV W/ CM
2 of 5 series · 14 of 46 positions shown, 16 images · IV contrast (omnipaque)
Comparison: None.

CLINICAL DATA: Epigastric pain

EXAM:
CT ABDOMEN AND PELVIS WITH CONTRAST
TECHNIQUE: Multidetector CT imaging of the abdomen and pelvis was performed
using the standard protocol following bolus administration of
intravenous contrast.
CONTRAST:  100mL OMNIPAQUE IOHEXOL 300 MG/ML  SOLN

[Series 3: abd/ pelvis 5.0 i30f 2 · axial · 0.84mm/px · z∈[+688,+1114]mm · 11 of 95 slices shown, 13 images]
[im 5/95  soft-tissue]
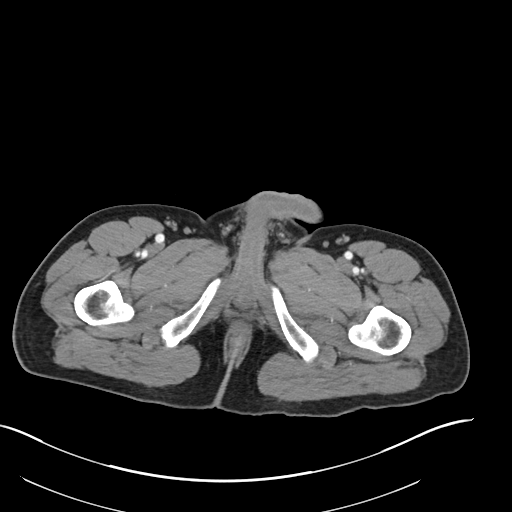
[im 5/95  bone]
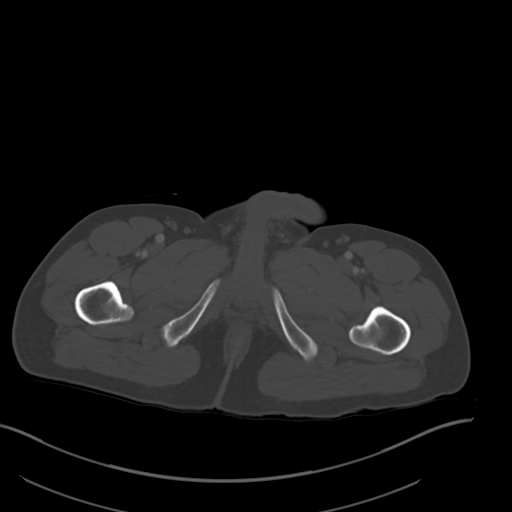
[im 15/95  soft-tissue]
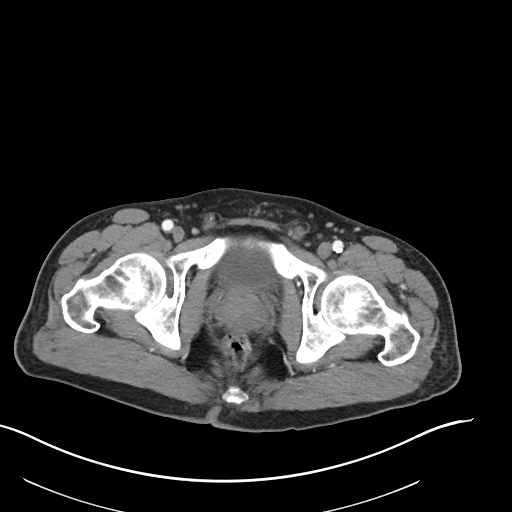
[im 25/95  soft-tissue]
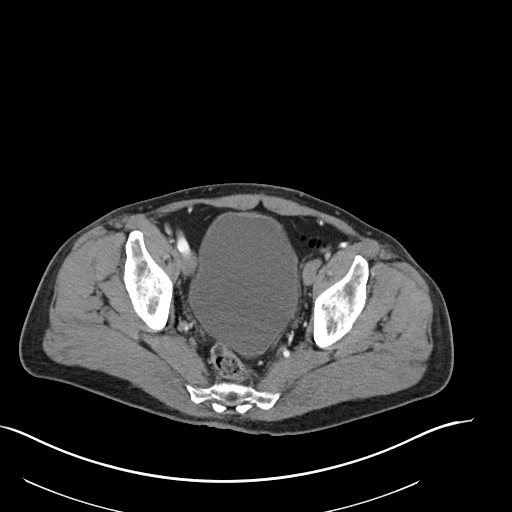
[im 30/95  soft-tissue]
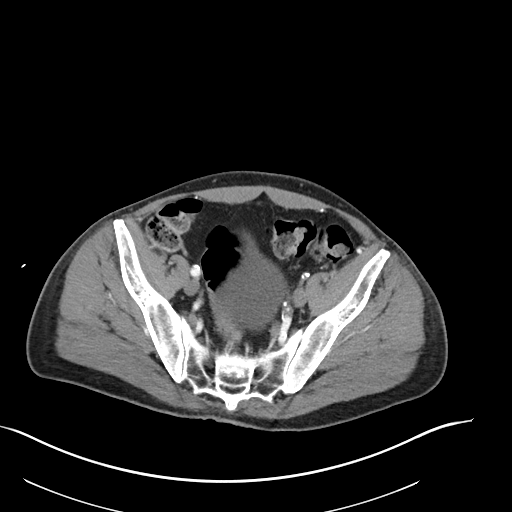
[im 40/95  soft-tissue]
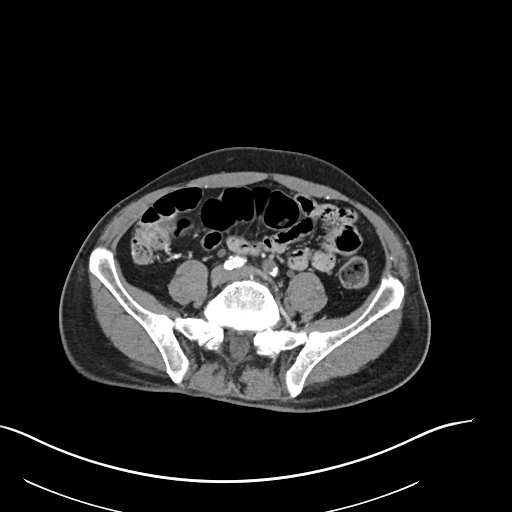
[im 50/95  soft-tissue]
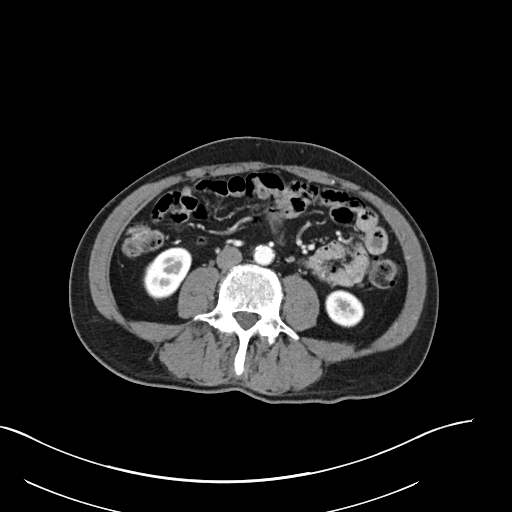
[im 55/95  soft-tissue]
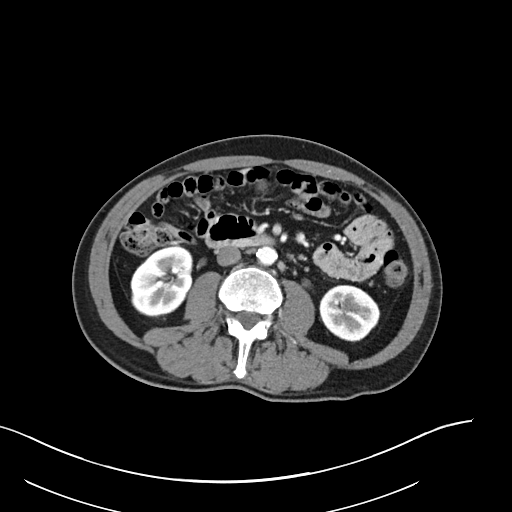
[im 65/95  soft-tissue]
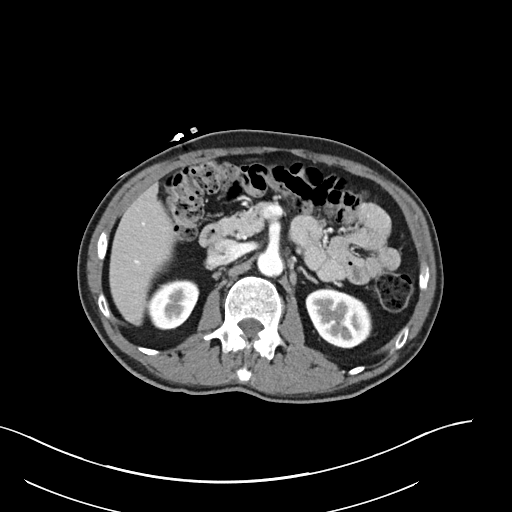
[im 70/95  soft-tissue]
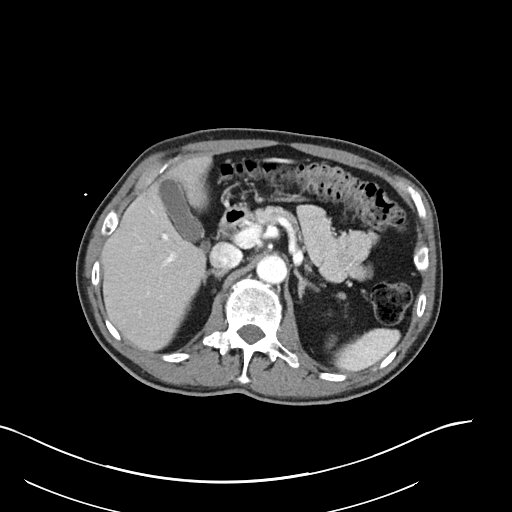
[im 70/95  bone]
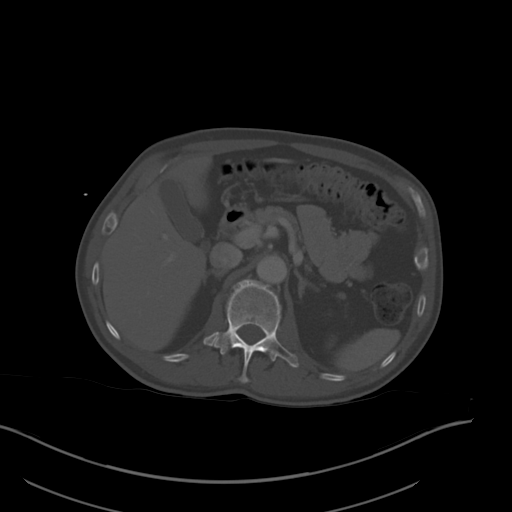
[im 80/95  soft-tissue]
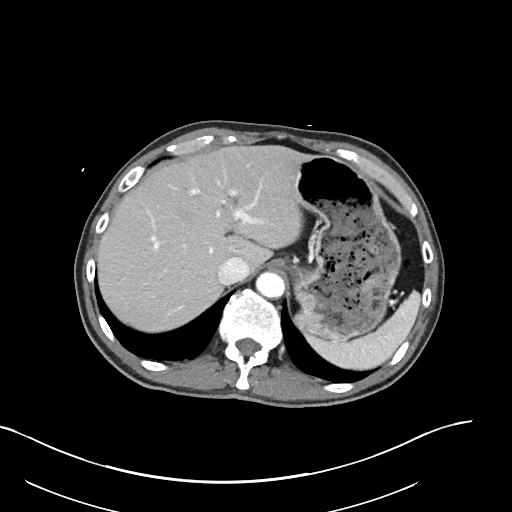
[im 90/95  soft-tissue]
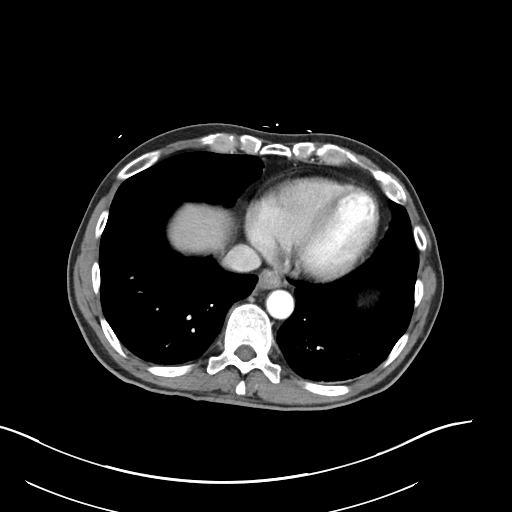

[Series 6: coronal soft tissue · coronal · 0.72mm/px · 3 of 88 slices shown]
[im 30/88  soft-tissue]
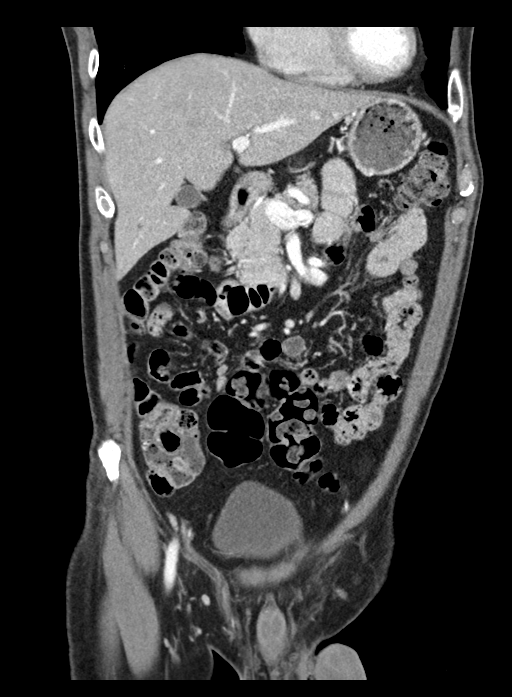
[im 39/88  soft-tissue]
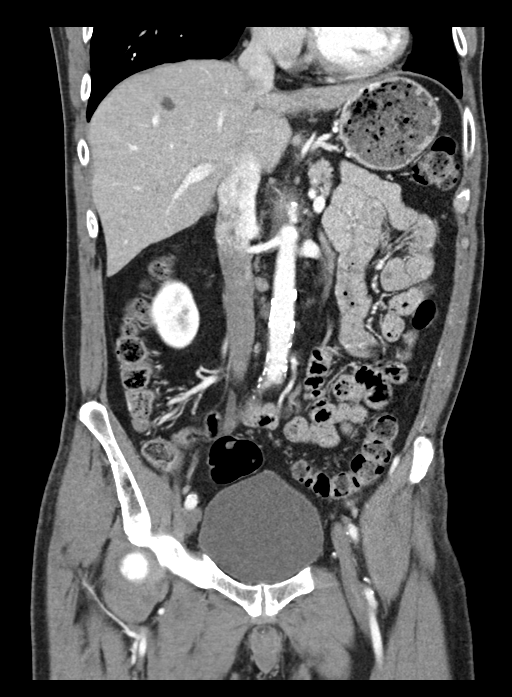
[im 49/88  soft-tissue]
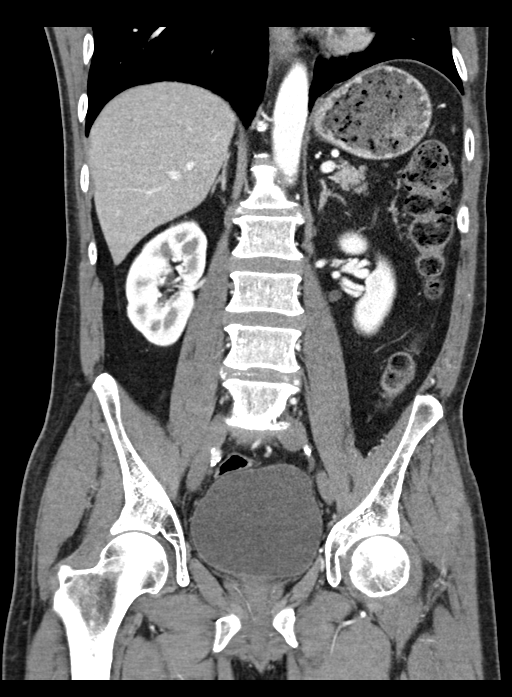

[14 of 46 positions shown; findings below may reference images not displayed]

FINDINGS: Lower chest: Unchanged small right middle lobe pulmonary nodule as
seen on prior chest CT in [REDACTED]. There is subendocardial
hypoattenuation apex and apical septum of the heart, LAD territory
(series 3, images 1-6). Per medical record patient had a prior LAD
territory ischemic event. On prior noncontrast CT of the chest, the
subendocardial apical septum is also hypoattenuating, but to a much
lesser degree in the apex. There is inferior apical wall myocardial
thinning suggestive of chronic infarct.

Hepatobiliary: There are multiple low-density hepatic lesions which
consistent with small cysts. No gallstones, gallbladder wall
thickening, or biliary dilatation.

Pancreas: Unremarkable. No pancreatic ductal dilatation or
surrounding inflammatory changes.

Spleen: Normal in size without focal abnormality.

Adrenals/Urinary Tract: Adrenal glands are unremarkable. No
hydronephrosis or nephrolithiasis. Bladder is unremarkable.

Stomach/Bowel: The stomach is within normal limits. There is no
evidence of bowel obstruction. The appendix is normal.

Vascular/Lymphatic: Aortoiliac atherosclerotic calcifications. No
AAA. No lymphadenopathy. There is long segment occlusion of the left
common, external iliac, and internal iliac arteries. There is
reconstitution distally collateral vessels leading into the common
femoral artery which is patent. The partially visualized proximal
SFA and deep profunda arteries are patent. There is moderate ostial
stenosis of the celiac trunk with sharp upper angulation (series 7,
image 66). Patent SMA and IMA.

Reproductive: Unremarkable.

Other: No abdominal wall hernia or abnormality. No abdominopelvic
ascites.

Musculoskeletal: No acute osseous abnormality. No suspicious lytic
or blastic lesions multilevel degenerative changes of the spine.
Trace retrolisthesis at L2-L3 and L5-S1.
IMPRESSION: Partially visualized subendocardial hypoattenuation within the LAD
territory, to a greater extent in comparison to noncontrast chest CT
in [REDACTED]. Apical inferior myocardial thinning compatible with old
infarct. Findings could potentially represent peri-infarct ischemia
in this patient with prior reported LAD territory infarct. Recommend
continued serial troponins and EKGs as recommended by cardiology.

No acute abdominopelvic abnormality.

Atherosclerotic disease of the aorta and branch vessels. Moderate
ostial stenosis of the celiac trunk with sharp upward angulation.
This angulation can be seen with median arcuate ligament syndrome in
the appropriate clinical setting. Patent SMA and IMA.

Occlusion of the left common, left external iliac, and left internal
iliac arteries, which is probably chronic. Reconstitution distally
by collateral vessels leading into the common femoral artery which
is patent. The partially visualized proximal SFA and deep profunda
arteries are patent.

These results were called by telephone at the time of interpretation
on 01/13/2021 at [DATE] to provider Dr. Ceejay (Cardiology), who
verbally acknowledged these results.

## 2023-03-02 ENCOUNTER — Other Ambulatory Visit: Payer: Self-pay | Admitting: Cardiology

## 2023-03-18 ENCOUNTER — Encounter: Payer: Self-pay | Admitting: Acute Care

## 2023-03-19 ENCOUNTER — Other Ambulatory Visit: Payer: Self-pay | Admitting: Cardiology

## 2023-03-27 ENCOUNTER — Other Ambulatory Visit: Payer: Self-pay | Admitting: Cardiology

## 2023-04-02 IMAGING — CT CT CHEST LCS NODULE FOLLOW-UP W/O CM
2 of 7 series · 15 of 36 positions shown, 19 images · non-contrast
Comparison: 10/31/2020

CLINICAL DATA: Lung cancer screening. One hundred pack-year
history. Current asymptomatic smoker.

EXAM:
CT CHEST WITHOUT CONTRAST FOR LUNG CANCER SCREENING NODULE FOLLOW-UP
TECHNIQUE: Multidetector CT imaging of the chest was performed following the
standard protocol without IV contrast.

[Series 5: lungs · axial · 0.61mm/px · z∈[-372,-54]mm · 14 of 350 slices shown, 18 images]
[im 16/350  mediastinal]
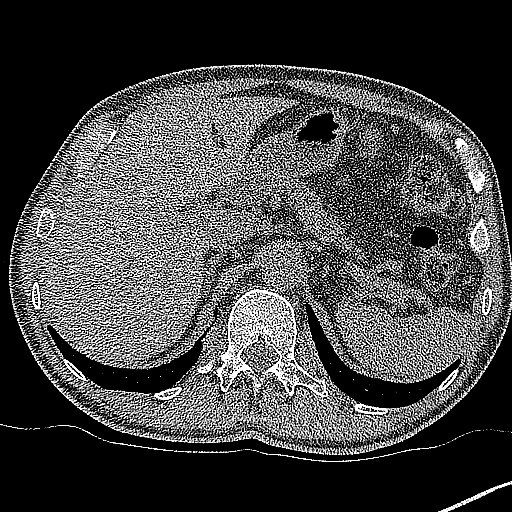
[im 16/350  lung]
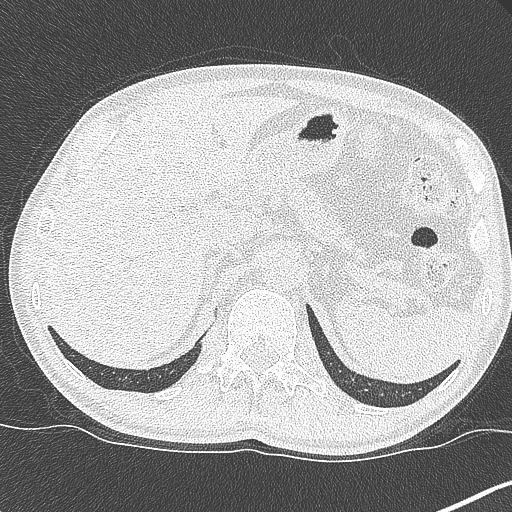
[im 48/350  lung]
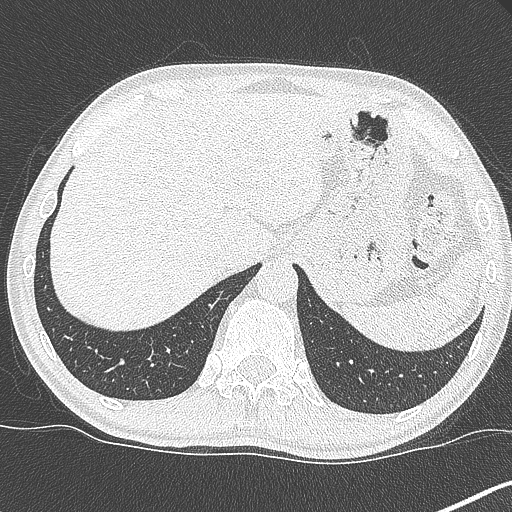
[im 64/350  lung]
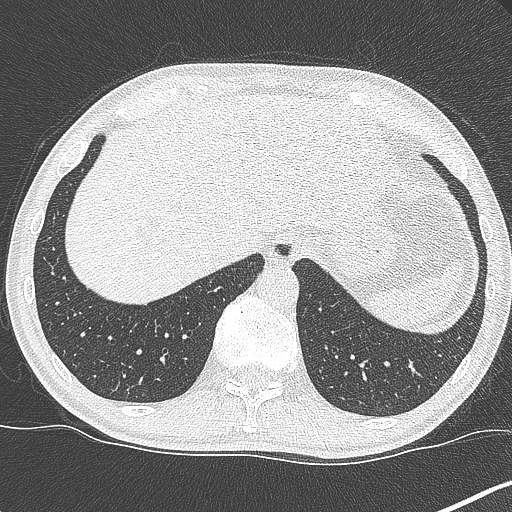
[im 96/350  lung]
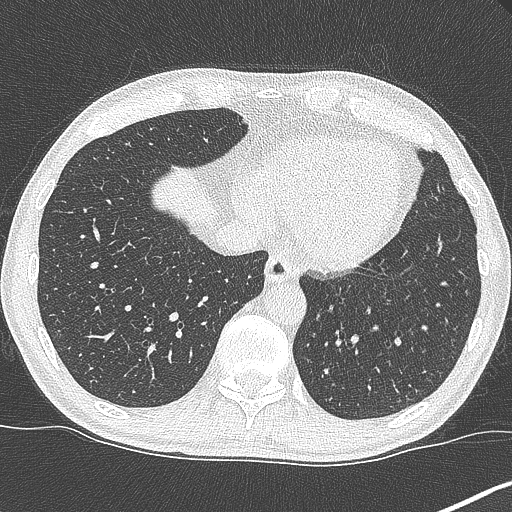
[im 112/350  mediastinal]
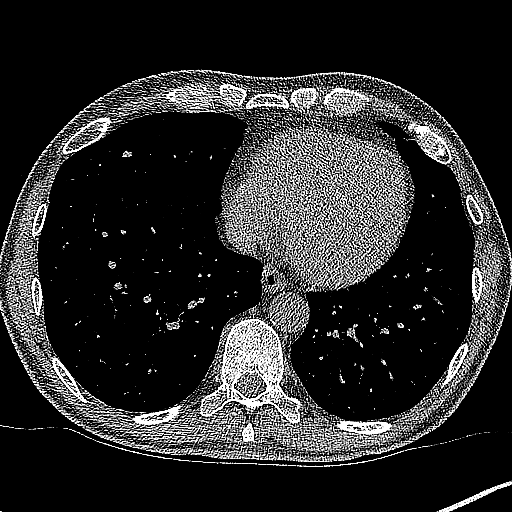
[im 112/350  lung]
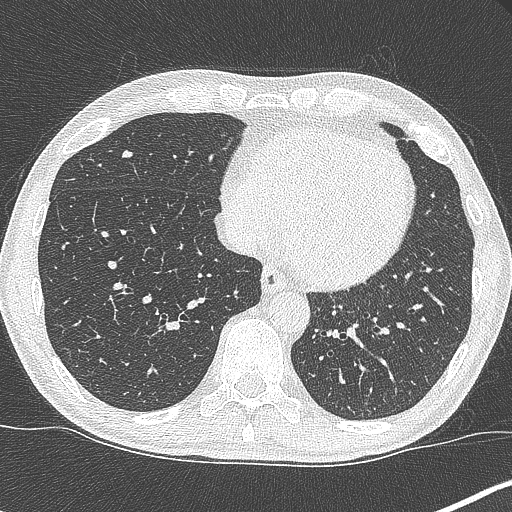
[im 143/350  lung]
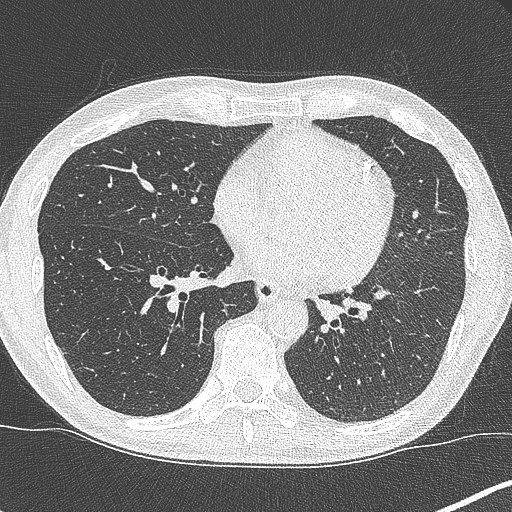
[im 159/350  lung]
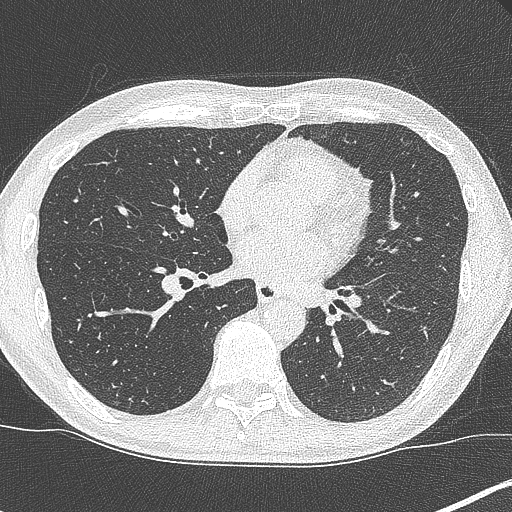
[im 191/350  lung]
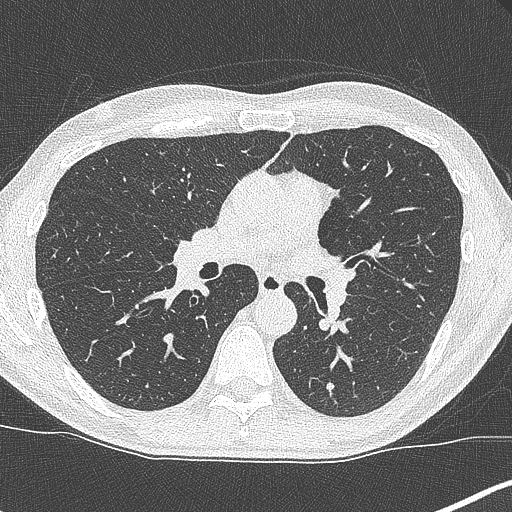
[im 207/350  mediastinal]
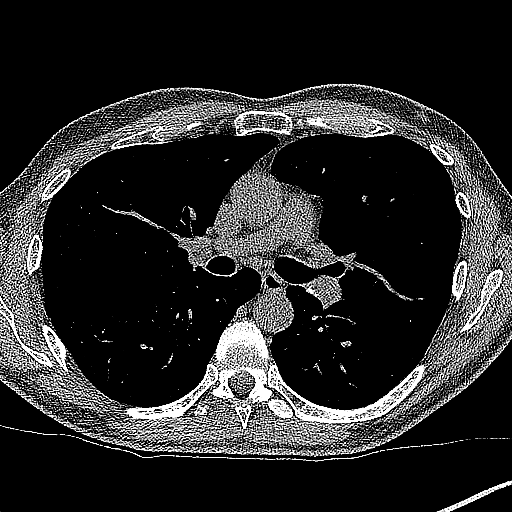
[im 207/350  lung]
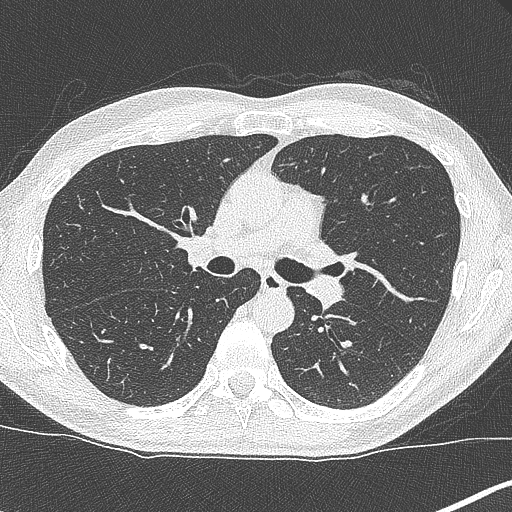
[im 238/350  lung]
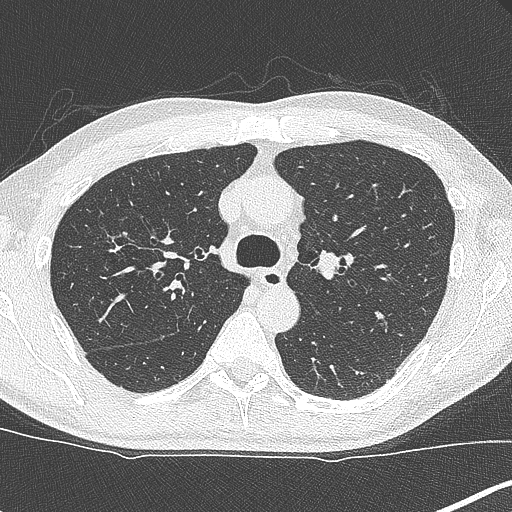
[im 254/350  lung]
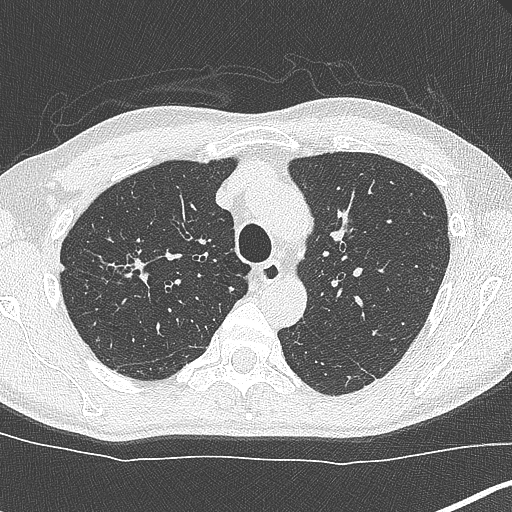
[im 286/350  lung]
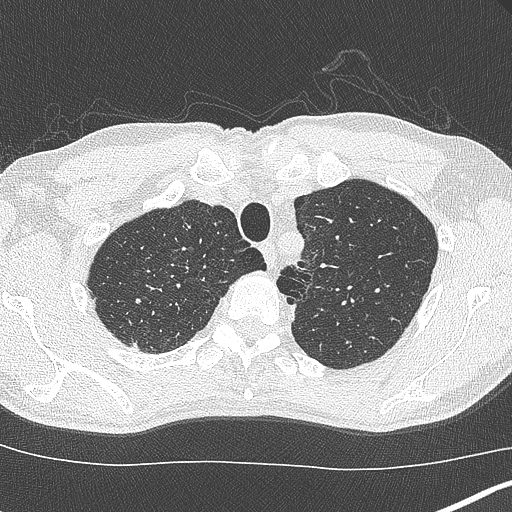
[im 302/350  mediastinal]
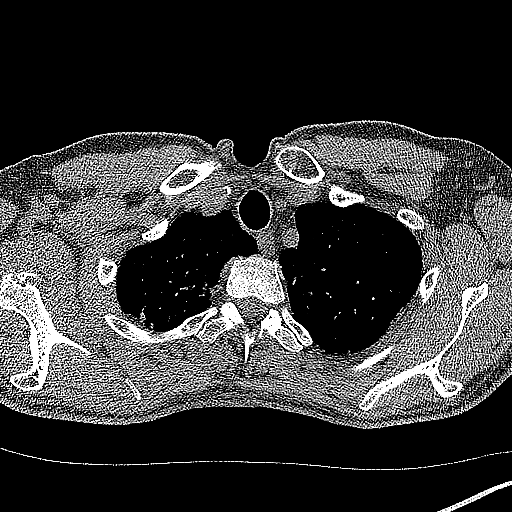
[im 302/350  lung]
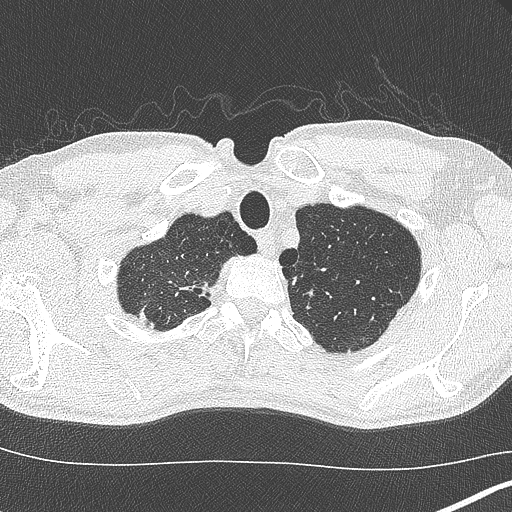
[im 334/350  lung]
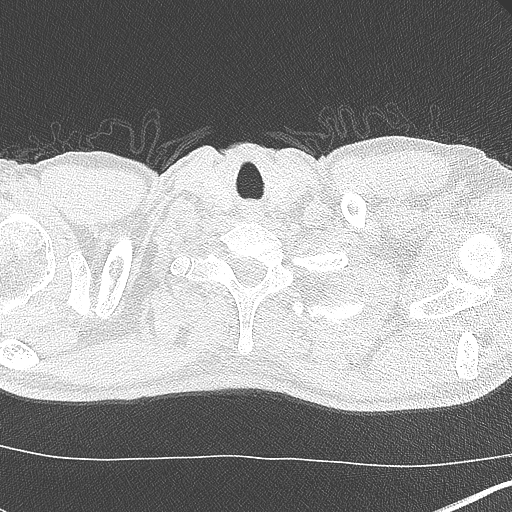

[Series 6: coronal · coronal · 0.71mm/px · 1 of 301 slices shown]
[im 151/301  lung]
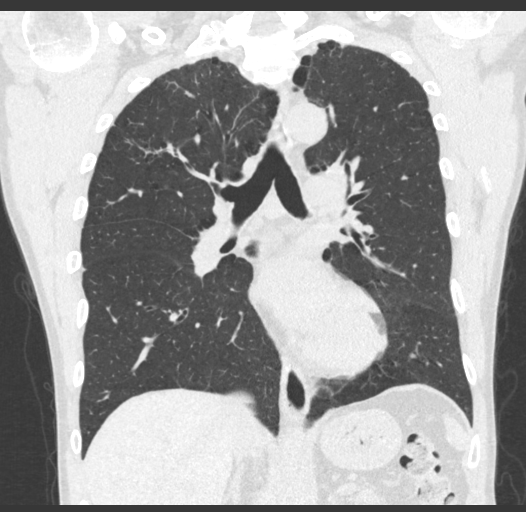

[15 of 36 positions shown; findings below may reference images not displayed]

FINDINGS: Cardiovascular: Heart size is normal. No pericardial effusion.
Aortic atherosclerosis and coronary artery calcifications.

Mediastinum/Nodes: No enlarged mediastinal, hilar, or axillary lymph
nodes. Thyroid gland, trachea, and esophagus demonstrate no
significant findings.

Lungs/Pleura: Paraseptal and centrilobular emphysema.
Pleuroparenchymal scarring in the right apex is unchanged. Multiple
calcified and noncalcified lung nodules are scattered throughout
both lungs. These appear unchanged when compared with the previous
exam. The largest lung nodule is in the superior segment of the left
lower lobe abutting the oblique fissure. This has a mean derived
diameter of 9.1 mm. No new or suspicious lung nodules.

Upper Abdomen: No acute findings within the imaged portions of the
upper abdomen. There are few scattered hypodensities within the
liver which appear unchanged and are favored to represent small
cysts.

Musculoskeletal: No chest wall mass or suspicious bone lesions
identified.
IMPRESSION: 1. Lung-RADS 2, benign appearance or behavior. Continue annual
screening with low-dose chest CT without contrast in 12 months.
2. Coronary artery calcifications.
3. Aortic Atherosclerosis (QLWVI-B2O.O) and Emphysema (QLWVI-3QR.M).

## 2023-05-01 ENCOUNTER — Other Ambulatory Visit: Payer: Self-pay | Admitting: Cardiology

## 2023-07-11 ENCOUNTER — Emergency Department (HOSPITAL_COMMUNITY)
Admission: EM | Admit: 2023-07-11 | Discharge: 2023-07-11 | Disposition: A | Discharge status: Home / Self Care | Payer: Self-pay | Attending: Student | Admitting: Student

## 2023-07-11 ENCOUNTER — Other Ambulatory Visit: Payer: Self-pay

## 2023-07-11 ENCOUNTER — Encounter (HOSPITAL_COMMUNITY): Payer: Self-pay | Admitting: *Deleted

## 2023-07-11 DIAGNOSIS — Z7902 Long term (current) use of antithrombotics/antiplatelets: Secondary | ICD-10-CM | POA: Diagnosis not present

## 2023-07-11 DIAGNOSIS — I5021 Acute systolic (congestive) heart failure: Secondary | ICD-10-CM | POA: Insufficient documentation

## 2023-07-11 DIAGNOSIS — R21 Rash and other nonspecific skin eruption: Secondary | ICD-10-CM | POA: Insufficient documentation

## 2023-07-11 DIAGNOSIS — F17210 Nicotine dependence, cigarettes, uncomplicated: Secondary | ICD-10-CM | POA: Diagnosis not present

## 2023-07-11 DIAGNOSIS — Z7982 Long term (current) use of aspirin: Secondary | ICD-10-CM | POA: Insufficient documentation

## 2023-07-11 DIAGNOSIS — B354 Tinea corporis: Secondary | ICD-10-CM

## 2023-07-11 DIAGNOSIS — I2510 Atherosclerotic heart disease of native coronary artery without angina pectoris: Secondary | ICD-10-CM | POA: Insufficient documentation

## 2023-07-11 MED ORDER — CLOTRIMAZOLE 1 % EX CREA: TOPICAL_CREAM | CUTANEOUS | 0 refills | Status: AC

## 2023-07-11 NOTE — ED Provider Notes (Addendum)
 [facility] EMERGENCY DEPARTMENT AT [facility] Provider Note  CSN: [id number] Arrival date & time: 07/11/23 1501  Chief Complaint(s) Groin Swelling  HPI [name] is a 58 y.o. 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  States that he first noticed the rash 4 days ago and the rash has been itchy causing him to scratch it and causing a small scab.  Denies recent high risk sexual activity.  Denies chest pain, shortness of breath, Donnell pain, nausea, vomiting or other systemic symptoms.   Past Medical History Past Medical History:  Diagnosis Date   Coronary arteriosclerosis    a. PCI LAD 2009 in [place];  b. 12/2010 Cath: LM: nl, LAD: patent stent mid, 25 mid, LCX nl, RCA: non dominant, nl, EF 50%   History of methamphetamine abuse (HCC)    a. Used x 6 yrs, quit July 2012   History of tobacco abuse    a. 41 yrs, up to 3 ppd, quit July 2012   Hypercholesterolemia    Patient Active Problem List   Diagnosis Date Noted   Ischemic cardiomyopathy 01/14/2021   PAD (peripheral artery disease) (HCC) 01/14/2021   Epigastric pain 01/13/2021   DOE (dyspnea on exertion) 09/14/2020   Cigarette smoker 09/14/2020   Acute systolic CHF (congestive heart failure) (HCC) 04/23/2020   STEMI (ST elevation myocardial infarction) (HCC) 04/22/2020   STEMI involving left anterior descending coronary artery (HCC) 04/22/2020   CAD (coronary artery disease)    Hyperlipidemia    History of methamphetamine abuse (HCC)    History of tobacco abuse    Chest pain 05/08/2011   Home Medication(s) Prior to Admission medications   Medication Sig Start Date End Date Taking? Authorizing Provider  albuterol  (VENTOLIN  HFA) 108 (90 Base) MCG/ACT inhaler Inhale 2 puffs into the lungs every 4 (four) hours as needed for wheezing or shortness  of breath. 03/07/21   Gerard Knight, MD  aspirin  EC 81 MG tablet Take 1 tablet (81 mg total) by mouth daily. 06/29/20   Berlin Breen., NP  atorvastatin  (LIPITOR ) 80 MG tablet Take 1 tablet by mouth once daily 03/27/23   Gerard Knight, MD  buPROPion  (WELLBUTRIN  SR) 150 MG 12 hr tablet Take 1 tablet (150 mg total) by mouth 2 (two) times daily. 12/30/21   Darlis Eisenmenger, MD  carvedilol  (COREG ) 3.125 MG tablet TAKE 1 TABLET BY MOUTH TWICE DAILY WITH A MEAL 05/01/23   Gerard Knight, MD  clopidogrel  (PLAVIX ) 75 MG tablet Take 1 tablet by mouth once daily 03/27/23   McDowell, Samuel G, MD  FARXIGA  10 MG TABS tablet TAKE 1 TABLET BY MOUTH ONCE DAILY PLEASE  SCHEDULE  AN  APPOINTMENT  FOR  FURTHER  REFILLS 03/19/23   Gerard Knight, MD  fexofenadine (ALLEGRA) 180 MG tablet Take 180 mg by mouth daily.    [provider]  fluticasone (FLONASE) 50 MCG/ACT nasal spray Place 1-2 sprays into both nostrils daily as needed for allergies. 12/24/20   [provider]  HYDROcodone -acetaminophen  (NORCO/VICODIN) 5-325 MG tablet Take 1 tablet by mouth every 6 (six) hours as needed for moderate pain. 07/04/21   Darrin Emerald, MD  ibuprofen  (ADVIL ) 800 MG tablet Take 1 tablet (800 mg total) by mouth every 8 (eight) hours as needed. 07/04/21   Darrin Emerald, MD  nitroGLYCERIN  (NITROSTAT ) 0.4 MG SL tablet Place 1 tablet (0.4 mg total) under the tongue every 5 (five) minutes x 3 doses as needed (if no relief after 2nd dose, proceed to ED or call 911). 12/16/21   Gerard Knight, MD  pantoprazole  (PROTONIX ) 40 MG tablet Take 1 tablet by mouth once daily 01/19/23   Gerard Knight, MD  sacubitril-valsartan (ENTRESTO ) 24-26 MG TAKE 1 TABLET BY MOUTH TWICE DAILY *DISCONTINUE LOSARTAN * 03/19/23   Gerard Knight, MD  spironolactone  (ALDACTONE ) 25 MG tablet TAKE 1 TABLET BY MOUTH ONCE DAILY . APPOINTMENT REQUIRED FOR FUTURE REFILLS 01/13/22   Darlis Eisenmenger, MD                                                                                                                                     Past Surgical History Past Surgical History:  Procedure Laterality Date   BACK SURGERY     fusion-lumbar   CORONARY ANGIOPLASTY WITH STENT PLACEMENT     CORONARY/GRAFT ACUTE MI REVASCULARIZATION N/A 04/22/2020   Procedure: Coronary/Graft Acute MI Revascularization;  Surgeon: Millicent Ally, MD;  Location: [facility] INVASIVE CV LAB;  Service: Cardiovascular;  Laterality: N/A;   LEFT HEART CATH AND CORONARY ANGIOGRAPHY N/A 04/22/2020   Procedure: LEFT HEART CATH AND CORONARY ANGIOGRAPHY;  Surgeon: Millicent Ally, MD;  Location: [facility] INVASIVE CV LAB;  Service: Cardiovascular;  Laterality: N/A;   Family History Family History  Problem Relation Age of Onset   Heart attack Father        died @ 42   Coronary artery disease Mother        alive in late 73's    Social History Social History   Tobacco Use   Smoking status: Every Day    Current packs/day: 0.25    Average packs/day: 0.3 packs/day for 51.0 years (12.8 ttl pk-yrs)    Types: Cigarettes    Passive exposure: Never   Smokeless tobacco: Never   Tobacco comments:    Currently 6-8 cigs per day  Vaping Use   Vaping status: Every Day   Substances: Flavoring  Substance Use Topics   Alcohol use: Not Currently    Comment: rare   Drug use: Not Currently    Types: Methamphetamines    Comment: previously used methamphetamines - quit 09/2010   Allergies Patient has no known allergies.  Review of Systems Review of Systems  Skin:  Positive for rash.    Physical Exam Vital Signs  I have reviewed the triage vital signs BP (!) 139/90 (BP Location: Right Arm)   Pulse 66   Temp 98 F (36.7 C) (Temporal)   Resp 16   Ht 5\' 11"  (1.803 m)   Wt 66.2 kg   SpO2 100%   BMI 20.36 kg/m   Physical Exam Constitutional:      General: He is not in acute distress.    Appearance: Normal appearance.  HENT:     Head: Normocephalic and  atraumatic.     Nose: No congestion or rhinorrhea.  Eyes:     General:        Right eye: No discharge.        Left eye: No discharge.     Extraocular Movements: Extraocular movements intact.     Pupils: Pupils are equal, round, and reactive to light.  Cardiovascular:     Rate and Rhythm: Normal rate and regular rhythm.     Heart sounds: No murmur heard. Pulmonary:     Effort: No respiratory distress.     Breath sounds: No wheezing or rales.  Abdominal:     General: There is no distension.     Tenderness: There is no abdominal tenderness.  Musculoskeletal:        General: Normal range of motion.     Cervical back: Normal range of motion.  Skin:    General: Skin is warm and dry.     Findings: Rash present.  Neurological:     General: No focal deficit present.     Mental Status: He is alert.     ED Results and Treatments Labs (all labs ordered are listed, but only abnormal results are displayed) Labs Reviewed - No data to display                                                                                                                        Radiology No results found.  Pertinent labs & imaging results that were available during my care of the patient were reviewed by me and considered in my medical decision making (see MDM for details).  Medications Ordered in ED Medications - No data to display                                                                                                                                   Procedures Procedures  (including critical care time)  Medical Decision Making / ED Course   This patient presents to the ED for concern of rash, this involves an extensive number of treatment options, and is a complaint that carries with it a high risk of complications and morbidity.  The differential diagnosis includes tinea corporis, lymphogranuloma venereum, cellulitis, squama cell carcinoma, melanoma, contact dermatitis  MDM: Patient  seen in the emergency room for evaluation of a groin rash.  Physical exam with  a 3-1/2 cm erythematous circular lesion with an erythematous border and some scabbing from the 10:00 to 12 o'clock position.  He denies any hypersexual contact and there is no lymphadenopathy.  Lower suspicion for lymphogranuloma.  There is no crepitus or pain in the area and low suspicion for necrotizing soft tissue skin infection.  Presentation is consistent with tinea corporis and he will be discharged with clotrimazole and outpatient follow-up.  Patient does have a dermatologist and will contact them for follow-up.   Additional history obtained: -Additional history obtained from wife -External records from outside source obtained and reviewed including: Chart review including previous notes, labs, imaging, consultation notes    Medicines ordered and prescription drug management: No orders of the defined types were placed in this encounter.   -I have reviewed the patients home medicines and have made adjustments as needed  Critical interventions none   Social Determinants of Health:  Factors impacting patients care include: none   Reevaluation: After the interventions noted above, I reevaluated the patient and found that they have :stayed the same  Co morbidities that complicate the patient evaluation  Past Medical History:  Diagnosis Date   Coronary arteriosclerosis    a. PCI LAD 2009 in [place];  b. 12/2010 Cath: LM: nl, LAD: patent stent mid, 25 mid, LCX nl, RCA: non dominant, nl, EF 50%   History of methamphetamine abuse (HCC)    a. Used x 6 yrs, quit July 2012   History of tobacco abuse    a. 41 yrs, up to 3 ppd, quit July 2012   Hypercholesterolemia       Dispostion: I considered admission for this patient, at this time he does not meet inpatient criteria for admission and will be discharged outpatient follow-up.     Final Clinical Impression(s) / ED Diagnoses Final diagnoses:  None      @PCDICTATION @    Karlyn Overman, MD 07/11/23 1627    Karlyn Overman, MD 07/11/23 [phone]

## 2023-07-11 NOTE — ED Notes (Signed)
 Patient has discharge orders, discharge teaching given and no further questions at this time.

## 2023-07-11 NOTE — ED Triage Notes (Signed)
 Pt with scabbed area to right groin, first noted Wednesday per pt. Pt states looks worse today. Denies any drainage.  Denies any fevers or chills. Pt has lost weight unintentionally.

## 2023-07-11 NOTE — Discharge Instructions (Signed)
 Please use the clotrimazole cream twice daily.  Make sure to continue using the cream for at least 1 week after lesion resolution.  Please call your dermatologist for rash recheck or return to the emergency department if symptoms worsen or you get a fever.

## 2023-08-12 IMAGING — DX DG FOOT COMPLETE 3+V*R*
3 series · 3 of 3 positions shown · non-contrast
Comparison: None.
COMPARISON: None.

Addendum:
CLINICAL DATA: 55-year-old male status post right foot injury.

EXAM:
RIGHT FOOT COMPLETE - 3+ VIEW

[foot ap]
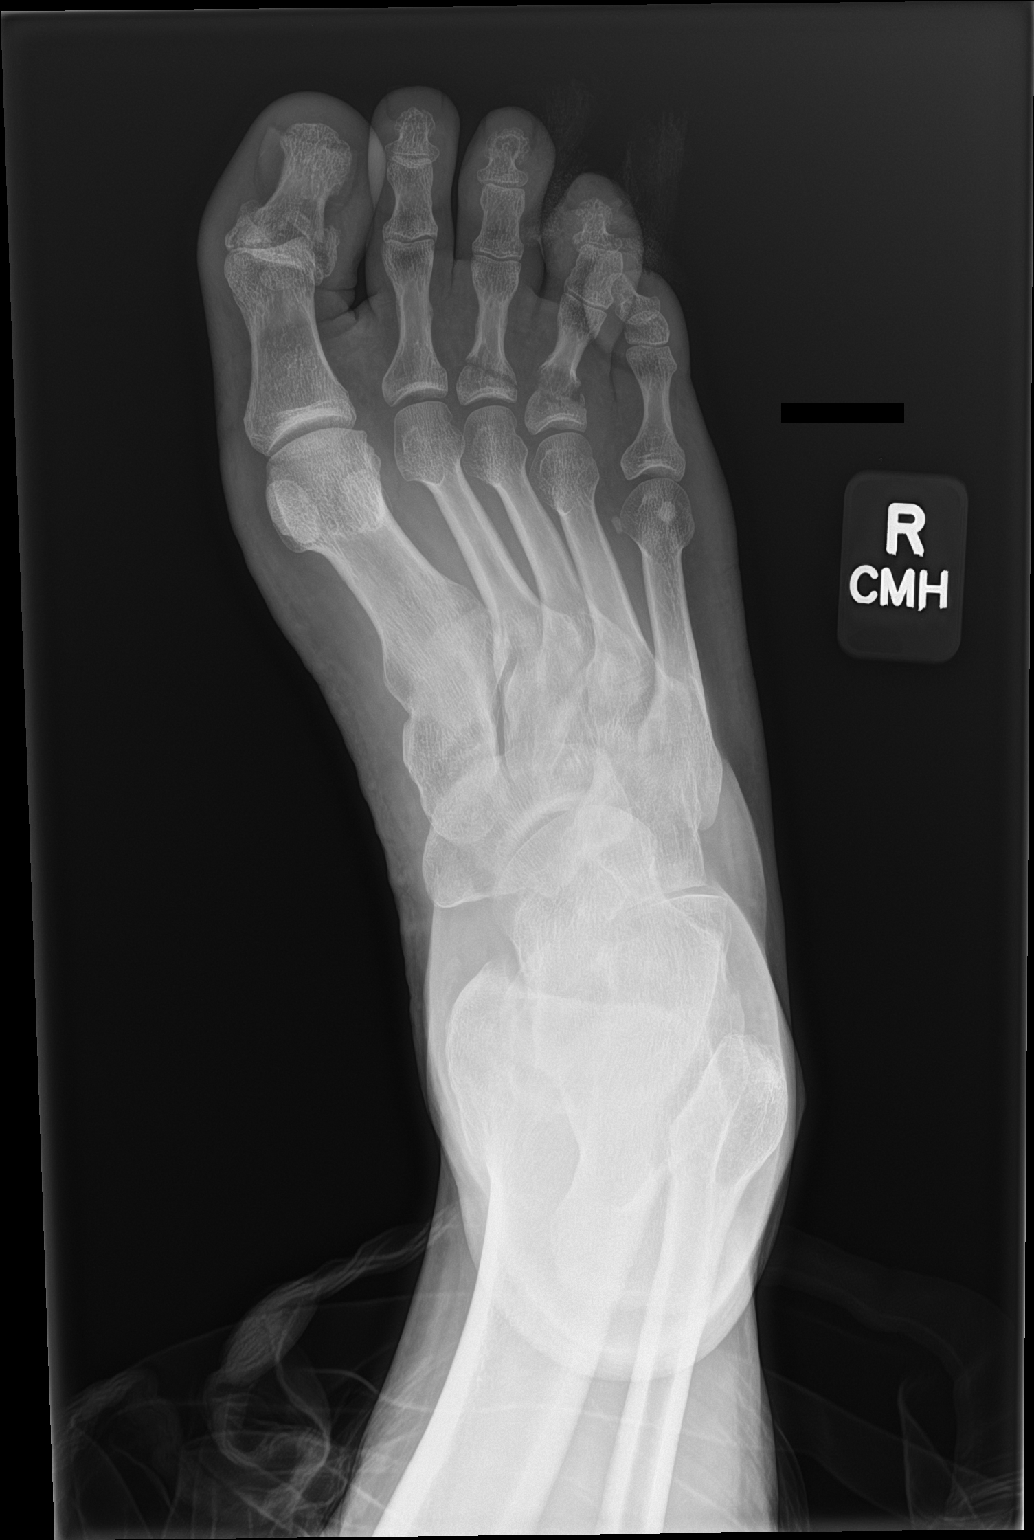

[foot obl]
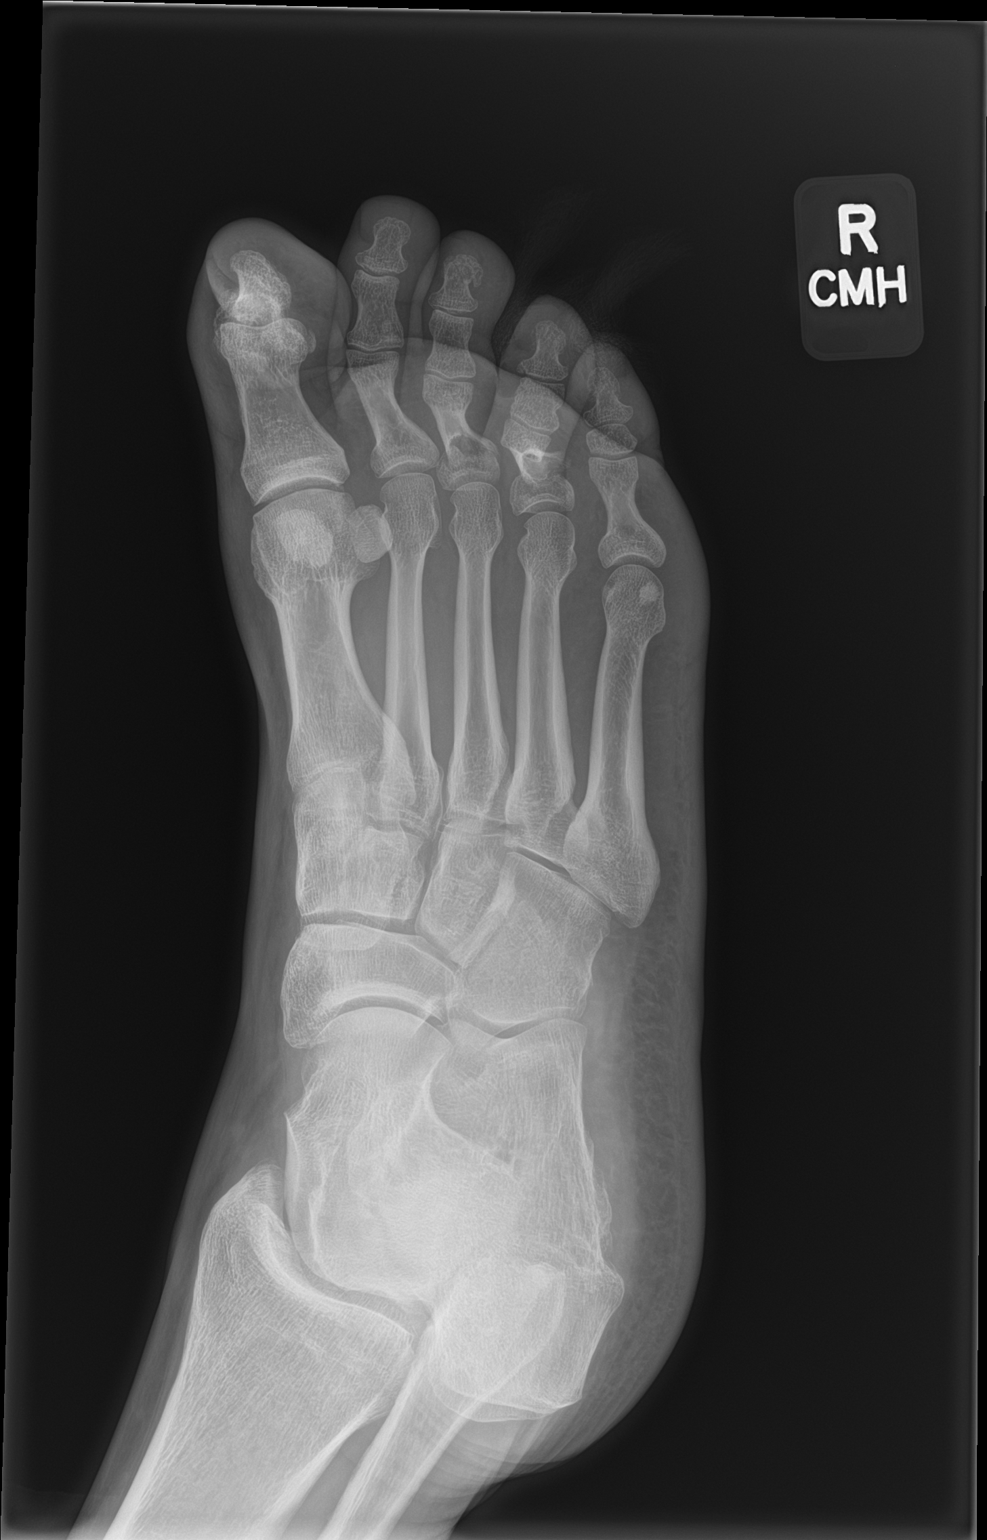

[foot lat]
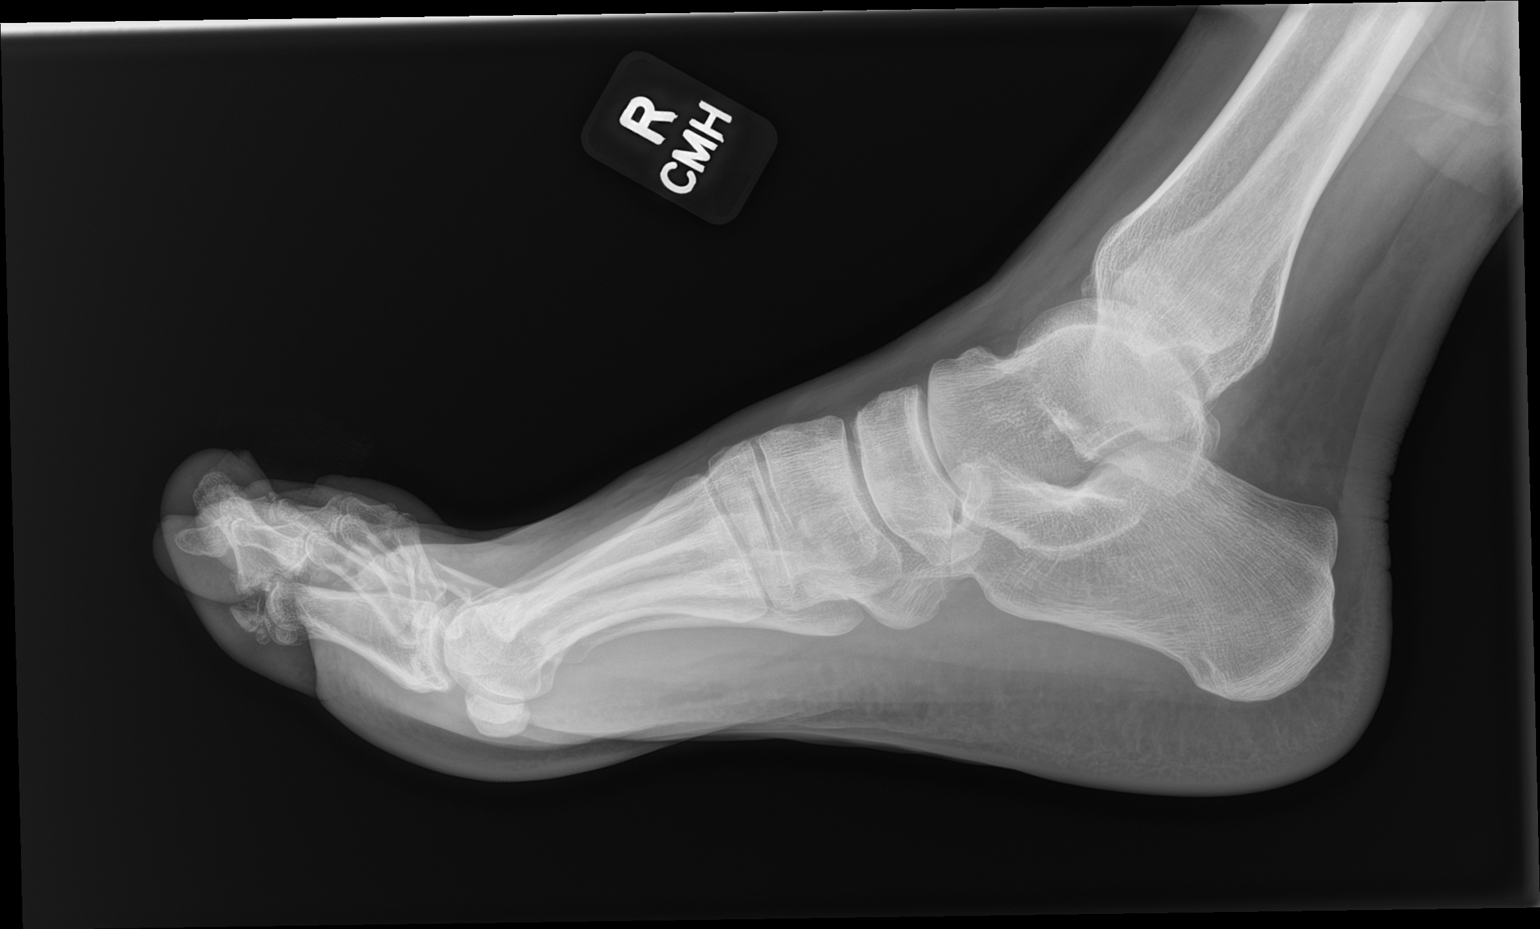

[3 of 3 positions shown; findings below may reference images not displayed]

FINDINGS: Three views of the right foot. Normal background bone
mineralization. Calcaneus and hindfoot appear intact. No evidence of
ankle joint effusion on the lateral. Tarsal bones appear intact.
Metatarsals appear intact and normally aligned.

Oblique mildly comminuted fracture through the base of the 3rd
proximal phalanx appears to be extra-articular on image #1. That
fracture is minimally displaced.

Comminuted fracture at the base of the 4th proximal phalanx also
appears to be extra-articular but is mildly displaced, and is
angulated toward the 5th ray.

Other phalanges appear intact.
IMPRESSION: 1. Comminuted but extra-articular appearing fractures at the base of
both the 3rd and 4th proximal phalanges. Mild distraction,
displacement, and valgus angulation at the latter.
2. Other right foot osseous structures appear intact.

ADDENDUM:
Study discussed by telephone with Dr. ASASU MAKAREM on 06/26/2021 at
9190 hours.

He advises a right great toe deformity clinically, and the lateral
image here best demonstrates a fracture dislocation at the right
great toe IP joint.

The base of the great toe distal phalanx is highly comminuted and
rotated, with dorsum displacement. The distal aspect of the proximal
phalanx appears to remain intact.
CONCLUSION: Additional highly comminuted intra-articular fracture at the Right
Great Toe IP joint with dorsally displaced and rotated 1st distal
phalanx.

*** End of Addendum ***
FINDINGS: Three views of the right foot. Normal background bone
mineralization. Calcaneus and hindfoot appear intact. No evidence of
ankle joint effusion on the lateral. Tarsal bones appear intact.
Metatarsals appear intact and normally aligned.

Oblique mildly comminuted fracture through the base of the 3rd
proximal phalanx appears to be extra-articular on image #1. That
fracture is minimally displaced.

Comminuted fracture at the base of the 4th proximal phalanx also
appears to be extra-articular but is mildly displaced, and is
angulated toward the 5th ray.

Other phalanges appear intact.
IMPRESSION: 1. Comminuted but extra-articular appearing fractures at the base of
both the 3rd and 4th proximal phalanges. Mild distraction,
displacement, and valgus angulation at the latter.
2. Other right foot osseous structures appear intact.
